# Patient Record
Sex: Female | Born: 1965
Health system: Southern US, Community
[De-identification: ages and names within clinical notes are randomized; demographics above are authoritative.]

## PROBLEM LIST (undated history)

## (undated) DIAGNOSIS — D219 Benign neoplasm of connective and other soft tissue, unspecified: Secondary | ICD-10-CM

## (undated) DIAGNOSIS — Z9221 Personal history of antineoplastic chemotherapy: Secondary | ICD-10-CM

## (undated) DIAGNOSIS — C50919 Malignant neoplasm of unspecified site of unspecified female breast: Secondary | ICD-10-CM

## (undated) DIAGNOSIS — Z923 Personal history of irradiation: Secondary | ICD-10-CM

## (undated) DIAGNOSIS — K219 Gastro-esophageal reflux disease without esophagitis: Secondary | ICD-10-CM

## (undated) DIAGNOSIS — I10 Essential (primary) hypertension: Secondary | ICD-10-CM

## (undated) DIAGNOSIS — R059 Cough, unspecified: Secondary | ICD-10-CM

## (undated) DIAGNOSIS — R05 Cough: Secondary | ICD-10-CM

## (undated) DIAGNOSIS — E119 Type 2 diabetes mellitus without complications: Secondary | ICD-10-CM

## (undated) HISTORY — PX: MASTECTOMY: SHX3

## (undated) HISTORY — DX: Essential (primary) hypertension: I10

## (undated) HISTORY — DX: Malignant neoplasm of unspecified site of unspecified female breast: C50.919

## (undated) HISTORY — PX: WISDOM TOOTH EXTRACTION: SHX21

## (undated) HISTORY — DX: Type 2 diabetes mellitus without complications: E11.9

---

## 2004-07-24 ENCOUNTER — Emergency Department (HOSPITAL_COMMUNITY): Admission: EM | Admit: 2004-07-24 | Discharge: 2004-07-24 | Payer: Self-pay | Admitting: Emergency Medicine

## 2011-12-01 ENCOUNTER — Ambulatory Visit: Payer: Self-pay

## 2011-12-15 DIAGNOSIS — D689 Coagulation defect, unspecified: Secondary | ICD-10-CM

## 2011-12-15 HISTORY — DX: Coagulation defect, unspecified: D68.9

## 2012-08-23 ENCOUNTER — Other Ambulatory Visit: Payer: Self-pay | Admitting: Obstetrics & Gynecology

## 2012-08-23 DIAGNOSIS — N63 Unspecified lump in unspecified breast: Secondary | ICD-10-CM

## 2012-08-24 ENCOUNTER — Other Ambulatory Visit: Payer: Self-pay

## 2012-08-25 ENCOUNTER — Ambulatory Visit
Admission: RE | Admit: 2012-08-25 | Discharge: 2012-08-25 | Disposition: A | Discharge status: Home / Self Care | Payer: 59 | Source: Ambulatory Visit | Attending: Obstetrics & Gynecology | Admitting: Obstetrics & Gynecology

## 2012-08-25 ENCOUNTER — Other Ambulatory Visit: Payer: Self-pay

## 2012-08-25 ENCOUNTER — Other Ambulatory Visit: Payer: Self-pay | Admitting: Obstetrics & Gynecology

## 2012-08-25 DIAGNOSIS — N63 Unspecified lump in unspecified breast: Secondary | ICD-10-CM

## 2012-08-25 DIAGNOSIS — N6320 Unspecified lump in the left breast, unspecified quadrant: Secondary | ICD-10-CM

## 2012-09-01 ENCOUNTER — Other Ambulatory Visit: Payer: Self-pay | Admitting: Obstetrics & Gynecology

## 2012-09-01 ENCOUNTER — Ambulatory Visit
Admission: RE | Admit: 2012-09-01 | Discharge: 2012-09-01 | Disposition: A | Discharge status: Home / Self Care | Payer: 59 | Source: Ambulatory Visit | Attending: Obstetrics & Gynecology | Admitting: Obstetrics & Gynecology

## 2012-09-01 DIAGNOSIS — N6320 Unspecified lump in the left breast, unspecified quadrant: Secondary | ICD-10-CM

## 2012-09-02 ENCOUNTER — Other Ambulatory Visit: Payer: Self-pay | Admitting: Obstetrics & Gynecology

## 2012-09-02 ENCOUNTER — Ambulatory Visit
Admission: RE | Admit: 2012-09-02 | Discharge: 2012-09-02 | Disposition: A | Discharge status: Home / Self Care | Payer: 59 | Source: Ambulatory Visit | Attending: Obstetrics & Gynecology | Admitting: Obstetrics & Gynecology

## 2012-09-02 DIAGNOSIS — C50912 Malignant neoplasm of unspecified site of left female breast: Secondary | ICD-10-CM

## 2012-09-02 DIAGNOSIS — N6320 Unspecified lump in the left breast, unspecified quadrant: Secondary | ICD-10-CM

## 2012-09-05 ENCOUNTER — Telehealth: Payer: Self-pay | Admitting: *Deleted

## 2012-09-05 DIAGNOSIS — C50419 Malignant neoplasm of upper-outer quadrant of unspecified female breast: Secondary | ICD-10-CM

## 2012-09-05 DIAGNOSIS — C50412 Malignant neoplasm of upper-outer quadrant of left female breast: Secondary | ICD-10-CM | POA: Insufficient documentation

## 2012-09-05 NOTE — Telephone Encounter (Signed)
Confirmed [facility] for 09/07/12 at 0800 .  Instructions and contact information given.  

## 2012-09-07 ENCOUNTER — Ambulatory Visit (HOSPITAL_BASED_OUTPATIENT_CLINIC_OR_DEPARTMENT_OTHER): Payer: 59 | Admitting: Oncology

## 2012-09-07 ENCOUNTER — Encounter: Payer: Self-pay | Admitting: Genetic Counselor

## 2012-09-07 ENCOUNTER — Encounter: Payer: Self-pay | Admitting: *Deleted

## 2012-09-07 ENCOUNTER — Ambulatory Visit: Payer: 59 | Attending: Surgery | Admitting: Physical Therapy

## 2012-09-07 ENCOUNTER — Ambulatory Visit
Admission: RE | Admit: 2012-09-07 | Discharge: 2012-09-07 | Disposition: A | Discharge status: Home / Self Care | Payer: 59 | Source: Ambulatory Visit | Attending: Radiation Oncology | Admitting: Radiation Oncology

## 2012-09-07 ENCOUNTER — Other Ambulatory Visit (HOSPITAL_BASED_OUTPATIENT_CLINIC_OR_DEPARTMENT_OTHER): Payer: 59 | Admitting: Lab

## 2012-09-07 ENCOUNTER — Ambulatory Visit: Payer: 59

## 2012-09-07 ENCOUNTER — Ambulatory Visit (HOSPITAL_BASED_OUTPATIENT_CLINIC_OR_DEPARTMENT_OTHER): Payer: 59 | Admitting: Surgery

## 2012-09-07 ENCOUNTER — Ambulatory Visit (HOSPITAL_BASED_OUTPATIENT_CLINIC_OR_DEPARTMENT_OTHER): Payer: 59 | Admitting: Genetic Counselor

## 2012-09-07 VITALS — BP 166/98 | HR 83 | Temp 98.6°F | Resp 20 | Ht 63.5 in | Wt 212.4 lb

## 2012-09-07 DIAGNOSIS — R293 Abnormal posture: Secondary | ICD-10-CM | POA: Insufficient documentation

## 2012-09-07 DIAGNOSIS — M25619 Stiffness of unspecified shoulder, not elsewhere classified: Secondary | ICD-10-CM | POA: Insufficient documentation

## 2012-09-07 DIAGNOSIS — C50419 Malignant neoplasm of upper-outer quadrant of unspecified female breast: Secondary | ICD-10-CM

## 2012-09-07 DIAGNOSIS — IMO0001 Reserved for inherently not codable concepts without codable children: Secondary | ICD-10-CM | POA: Insufficient documentation

## 2012-09-07 DIAGNOSIS — Z801 Family history of malignant neoplasm of trachea, bronchus and lung: Secondary | ICD-10-CM

## 2012-09-07 DIAGNOSIS — C50919 Malignant neoplasm of unspecified site of unspecified female breast: Secondary | ICD-10-CM | POA: Insufficient documentation

## 2012-09-07 LAB — COMPREHENSIVE METABOLIC PANEL (CC13)
ALT: 15 U/L (ref 0–55)
AST: 21 U/L (ref 5–34)
Albumin: 3.5 g/dL (ref 3.5–5.0)
Alkaline Phosphatase: 134 U/L (ref 40–150)
Calcium: 9.5 mg/dL (ref 8.4–10.4)
Chloride: 105 mEq/L (ref 98–107)
Creatinine: 0.8 mg/dL (ref 0.6–1.1)
Potassium: 3.7 mEq/L (ref 3.5–5.1)

## 2012-09-07 LAB — CBC WITH DIFFERENTIAL/PLATELET
BASO%: 0.8 % (ref 0.0–2.0)
EOS%: 1.9 % (ref 0.0–7.0)
MCH: 20.9 pg — ABNORMAL LOW (ref 25.1–34.0)
MCHC: 30.5 g/dL — ABNORMAL LOW (ref 31.5–36.0)
RDW: 20.6 % — ABNORMAL HIGH (ref 11.2–14.5)
lymph#: 2.7 10*3/uL (ref 0.9–3.3)

## 2012-09-07 NOTE — Progress Notes (Signed)
Dr.  Pierce Crane requested a consultation for genetic counseling and risk assessment for Hannah Hale, a 46 y.o. female, for discussion of her personal history of breast cancer and family history of lung and prostate cancer. She presents to clinic today, with her husband and sister, to discuss the possibility of a genetic predisposition to cancer, and to further clarify her risks, as well as her family members' risks for cancer.   HISTORY OF PRESENT ILLNESS: In September 2013, at the age of 35, Hannah Hale was diagnosed with breast cancer.  She is being seen in the multidisciplinary clinic.    Past Medical History  Diagnosis Date  . Breast cancer     History reviewed. No pertinent past surgical history.  History  Substance Use Topics  . Smoking status: Current Every Day Smoker -- 0.5 packs/day for 29 years    Types: Cigarettes  . Smokeless tobacco: Not on file  . Alcohol Use: 3.6 oz/week    6 Cans of beer per week     6 pack/week    REPRODUCTIVE HISTORY AND PERSONAL RISK ASSESSMENT FACTORS: Menarche was at age 47.   Premenopausal Uterus Intact: Yes Ovaries Intact: Yes [id number] , first live birth at age N/A  She has not previously undergone treatment for infertility.   OCP use for 15 years   She has not used HRT in the past.    FAMILY HISTORY:  We obtained a detailed, 4-generation family history.  Significant diagnoses are listed below: Family History  Problem Relation Age of Onset  . Lung cancer Maternal Grandfather   . Diabetes Mother   . Prostate cancer Father 77  . Stroke Maternal Grandmother   . Lung cancer Cousin     non smoker, died in his 24s; paternal cousin  . Lung cancer Cousin     father's maternal cousin; smoker  the patient was diagnosed with breast cancer at age 54. Her father was diagnosed with prostate cancer at age 64, and her paternal cousin, who was a non-smoker, was diagnosed and died of lung cancer in his 26s.  Her father's maternal  cousin was a smoker and died of lung cancer.  The patient's maternal grandfather also was a smoker and died of lung cancer.  There is no other reported cancer history of either side of the family.  Patient's maternal ancestors are of [place] descent, and paternal ancestors are of [place] descent. There is no reported Ashkenazi Jewish ancestry. There is no  known consanguinity.  GENETIC COUNSELING RISK ASSESSMENT, DISCUSSION, AND SUGGESTED FOLLOW UP: We reviewed the natural history and genetic etiology of sporadic, familial and hereditary cancer syndromes.  About 5-10% of breast cancer is hereditary.  Of this, about 85% is the result of a BRCA1 or BRCA2 mutation.  We reviewed the red flags of hereditary cancer syndromes and the dominant inheritance patterns.  If the BRCA testing is negative, we discussed that we could be testing for the wrong gene.  We discussed gene panels, and that several cancer genes that are associated with different cancers can be tested at the same time.  Because of the different types of cancer that are in the patient's family, I would consider the BRCAPlus panel test.  The patient declined genetic testing.   The patient's personal history of breast cancer and family history of prostate and lung cancer is suggestive of the following possible diagnosis: possible hereditary cancer syndrome  We discussed that  identification of a hereditary cancer syndrome may help her care providers tailor the patients medical management. If a mutation indicating a hereditary cancer syndrome is detected in this case, the [employer] recommendations would include increased cancer survelliance and possible prophylactic surgery. If a mutation is detected, the patient will be referred back to the referring provider and to any additional appropriate care providers to discuss the relevant options.   If a mutation is not  found in the patient, this will decrease the likelihood of a hereditary cancer syndrome as the explanation for her breast cancer. Cancer surveillance options would be discussed for the patient according to the appropriate standard National Comprehensive Cancer Network and American Cancer Society guidelines, with consideration of their personal and family history risk factors. In this case, the patient will be referred back to their care providers for discussions of management.   In order to estimate her chance of having a BRCA1 or BRCA2 mutation, we used the statistical model (Penn II) and laboratory data that take into account her personal medical history, family history and ancestry.  Because each model is different, there can be a lot of variability in the risks they give.  Therefore, these numbers must be considered a rough range and not a precise risk of having a BRCA1 or BRCA2 mutation.  This model estimates that she has approximately a 10% chance of having a mutation. Based on this assessment of her family and personal history, genetic testing is recommended.  After considering the risks, benefits, and limitations, the patient declined genetic testing.   The patient was seen for a total of 30 minutes, greater than 50% of which was spent face-to-face counseling.  This plan is being carried out per Dr. Theron Arista Rubin's recommendations.  This note will also be sent to the referring provider via the electronic medical record. The patient will be supplied with a summary of this genetic counseling discussion as well as educational information on the discussed hereditary cancer syndromes following the conclusion of their visit.   Patient was discussed with Dr. Drue Second.  _______________________________________________________________________ For Office Staff:  Number of people involved in session: 4 Was an Intern/ student involved with case: not applicable

## 2012-09-07 NOTE — Progress Notes (Addendum)
Re:   [name] DOB:   Jan 24, 1966 MRN:   [id number]  [facility]  ASSESSMENT AND PLAN: 1.  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  I discussed the options for breast cancer treatment with the patient.  I discussed the idea of a multidisciplinary approach to the treatment of breast cancer, which includes medical oncology and radiation oncology.  I discussed the surgical options of lumpectomy vs. mastectomy.  She will need a mastectomy and there is the possibility of reconstruction.  I talked about how smoking will affect that decision. I discussed the options of lymph node biopsy.  The treatment plan depends on the pathologic staging of the tumor and the patient's personal wishes.  The risks of surgery include, but are not limited to, bleeding, infection, the need for further surgery, and nerve injury.  The patient has been given literature on the treatment of breast cancer.  Plan:  To get MRI tomorrow, to see Dr. Odis Luster 09/09/2012 about possible reconstruction, then will schedule surgery.  She is seeing genetics also.  [MRI - 09/08/2012.  The abnormal area in total measures 17.6(AP) x 8.8 (trv) x 9.4 (CC) cm.  DN  09/13/2012] [I spoke with Ms. Kizzie Bane.  She has seen Dr. Odis Luster and plans immediate reconstruction with what sounds like a combined latissimus flap/implant.  Will shcedule.  DN 09/13/2012] [The patient's hgb is 8.9.  She does have heavy periods and has uterine fibroids.  Dr. Odis Luster is concerned about immediate reconstruction, but the patient wants to continue with the plan for mastectomy. So will plan mastectomy without reconstruction on Thursday, 11/03/2012.  DN  10/31/2012]  2.  Smokes cigarettes - 1/2 pack per day  Going to try to quit.  REFERRING PHYSICIAN: GUEST, Loretha Stapler, MD  HISTORY OF PRESENT ILLNESS: Hannah Hale is a 46 y.o. (DOB: [birth year])  AA female whose primary care physician  is GUEST, Loretha Stapler, MD and comes [facility] the [facility] for left breast cancer.  Her husband and her sister, Hannah Hale (from [place]), are with her.  She sees Dr. Mitchel Honour at [facility].  Ms. Sapien felt a lumpiness in her left breast recently.  This prompted a mammogram.  She has had no prior breast problems or surgery.  This was her first mammogram. She still is having regular periods. LMP was 3 weeks.  Has no children.  No family history of breast cancer.   Past Medical History  Diagnosis Date  . Breast cancer      No past surgical history on file.    Current Outpatient Prescriptions  Medication Sig Dispense Refill  . diphenhydrAMINE (SOMINEX) 25 MG tablet Take 25 mg by mouth at bedtime as needed.         No Known Allergies  REVIEW OF SYSTEMS: Skin:  No history of rash.  No history of abnormal moles. Infection:  No history of hepatitis or HIV.  No history of MRSA. Neurologic:  No history of stroke.  No history of seizure.  No history of headaches. Cardiac:  No history of hypertension. No history of heart disease.  No history of prior cardiac catheterization.  No history of seeing a cardiologist. Pulmonary:  Smokes 1/2 ppd. She knows this is bad for her health.  Endocrine:  No diabetes. No thyroid disease. Gastrointestinal:  No history of stomach disease.  No history of liver disease.  No history of gall bladder  disease.  No history of pancreas disease.  No history of colon disease. Urologic:  No history of kidney stones.  No history of bladder infections. Musculoskeletal:  No history of joint or back disease. Hematologic:  No bleeding disorder.  No history of anemia.  Not anticoagulated. Psycho-social:  The patient is oriented.   The patient has no obvious psychologic or social impairment to understanding our conversation and plan.  SOCIAL and FAMILY HISTORY: Married.  Husband is with her. She works at [employer] as a Scientist, clinical (histocompatibility and immunogenetics). Her sister, Hannah Hale, is with her.  PHYSICAL  EXAM: LMP 08/04/2012   General: AA F who is alert and generally healthy appearing.  HEENT: Normal. Pupils equal. Neck: Supple. No mass.  No thyroid mass. Lymph Nodes:  No supraclavicular, cervical nodes, or axillary nodes. Lungs: Clear to auscultation and symmetric breath sounds. Heart:  RRR. No murmur or rub. Breasts:  Left:  Tender from biopsy, lumpiness at the 3 o'clock position.  It is difficult for me to tell how much of a mass effect there is because of the tenderness.  Acne LIQ.  Right:  She has acne in the LIQ of both breasts.  Abdomen: Soft. No mass. No tenderness. No hernia. Normal bowel sounds.  No abdominal scars. Rectal: Not done. Extremities:  Good strength and ROM  in upper and lower extremities. Neurologic:  Grossly intact to motor and sensory function. Psychiatric: Has normal mood and affect. Behavior is normal.   DATA REVIEWED: Mammogram and path.  Path report to patient.  Ovidio Kin, MD,  [facility] [address] [place]  [address]   [place], [place]    [address] Phone:  [phone] FAX:  [phone]

## 2012-09-07 NOTE — Progress Notes (Signed)
[facility] Psychosocial Distress Screening Clinical Social Work  Clinical Social Work was referred by distress screening protocol.  The patient scored a 3 on the Psychosocial Distress Thermometer which indicates mild distress. Clinical Child psychotherapist met with patient, patient's husband,and sister in [facility] to assess for distress and other psychosocial needs.  Pt was tearful at times, but stated she felt much better after speaking with the physicians.  CSW provided pt with information on the [facility] support team and support services.  CSW also provided a patient and family support calendar and encouraged pt to call with any questions or concerns.      Clinical Social Worker follow up needed: not at this time  Tamala Julian, MSW, LCSW Clinical Social Worker [facility] [phone]

## 2012-09-07 NOTE — Progress Notes (Signed)
I met with Hannah Hale and her family today. At this point it looks like she has a large area of microcalcifications and DCIS. We discussed that due to the size of this lesion she would benefit from a mastectomy. I do not see a role for radiation at this time. She would only need postop radiation if she was found to have a positive lymph node, positive margin for invasive cancer or invasive cancer measuring greater than 5 cm. I have not scheduled followup with me.

## 2012-09-08 ENCOUNTER — Ambulatory Visit
Admission: RE | Admit: 2012-09-08 | Discharge: 2012-09-08 | Disposition: A | Discharge status: Home / Self Care | Payer: 59 | Source: Ambulatory Visit | Attending: Obstetrics & Gynecology | Admitting: Obstetrics & Gynecology

## 2012-09-08 DIAGNOSIS — C50912 Malignant neoplasm of unspecified site of left female breast: Secondary | ICD-10-CM

## 2012-09-08 MED ORDER — GADOBENATE DIMEGLUMINE 529 MG/ML IV SOLN
19.0000 mL | Freq: Once | INTRAVENOUS | Status: AC | PRN
  Administered 2012-09-08: 19 mL via INTRAVENOUS

## 2012-09-11 NOTE — Progress Notes (Signed)
Hannah Hale [id number] [birth year] 46 y.o. 09/11/2012 5:38 PM  CC  GUEST, Loretha Stapler, MD [address] [place] [address]  REASON FOR CONSULTATION:  DCIS Patient was seen in the Multidisciplinary Breast Clinic for discussion of her treatment options. She was seen by Dr. Pierce Crane, Radiation [facility]  STAGE:   Cancer of upper-outer quadrant of female breast, Left.   Primary site: Breast (Left)   Staging method: AJCC 7th Edition   Clinical: Stage 0 (Tis (DCIS), N0, cM0)   Summary: Stage 0 (Tis (DCIS), N0, cM0)  REFERRING PHYSICIAN:    HISTORY OF PRESENT ILLNESS:  Hannah Hale is a 46 y.o. female.   In previous good health. She had detected today abnormality in her left breast for number of weeks prior to presentation. She underwent bilateral diagnostic mammogram 08/25/2012. This showed pleomorphic calcifications of the entire upper outer left breast measuring 11 x 12 x 15 cm. Physical exam showed a firm multinodular masslike area over the left upper outer quadrant. Ultrasound confirmed multiple ill-defined hypoechoic areas. Ultrasound of the left axilla showed a few abnormal appearing lymph nodes. Biopsy of both the lymph node and he upper outer quadrant of the left breast was performed 09/01/2012. The biopsy showed high-grade DCIS with necrosis, lymph node was negative. He prognostic panel is currently pending. ER2% PR 0%.  Past Medical History:  Past Medical History  Diagnosis Date  . Breast cancer     Past Surgical History:  No past surgical history on file.  Family History:  Family History  Problem Relation Age of Onset  . Lung cancer Maternal Grandfather   . Diabetes Mother   . Prostate cancer Father 32  . Stroke Maternal Grandmother   . Lung cancer Cousin     non smoker, died in his 20s; paternal cousin  . Lung cancer Cousin     father's maternal cousin; smoker    Social History  History    Substance Use Topics  . Smoking status: Current Every Day Smoker -- 0.5 packs/day for 29 years    Types: Cigarettes  . Smokeless tobacco: Not on file  . 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 Allergies:  No Known Allergies  Current Medications:  Current Outpatient Prescriptions  Medication Sig Dispense Refill  . diphenhydrAMINE (SOMINEX) 25 MG tablet Take 25 mg by mouth at bedtime as needed.        OB/GYN History: G0 P0, menarche 15 currently having regular periods. No history of hormone replacement therapy.  Fertility Discussion: NA Prior History of Cancer: No  Health Maintenance:  Colonoscopy no  Bone Density no  Last PAP smear 2013  ECOG PERFORMANCE STATUS: 0 - Asymptomatic  Genetic Counseling/testing: N.  REVIEW OF SYSTEMS:  A comprehensive review of systems was negative.  PHYSICAL EXAMINATION: Blood pressure 166/98, pulse 83, temperature 98.6 F (37 C), resp. rate 20, height 5' 3.5" (1.613 m), weight 212 lb 6.4 oz (96.344 kg), last menstrual period 08/04/2012.  HEENT:  Sclerae anicteric, conjunctivae pink.  Oropharynx clear.  No mucositis or candidiasis.  Nodes:  No cervical, supraclavicular, or axillary lymphadenopathy palpated.  Breast Exam:  Right breast is benign.  No masses, discharge, skin change, or nipple inversion.  Left breast is benign. , She has some tenderness present. There is a vague masslike effect  in the lower quadrant. No masses, discharge, skin change, or nipple inversion..  Lungs:  Clear to auscultation bilaterally.  No crackles, rhonchi, or wheezes.  Heart:  Regular rate and rhythm.  Abdomen:  Soft, nontender.  Positive bowel sounds.  No organomegaly or masses palpated.  Musculoskeletal:  No focal spinal tenderness to palpation.  Extremities:  Benign.  No peripheral edema or cyanosis.  Skin:  Benign.  Neuro:  Nonfocal.       STUDIES/RESULTS: US Breast Left  09-21-2012  *RADIOLOGY REPORT*  Clinical Data:  Patient presents for a bilateral diagnostic mammogram due to a history of a palpable abnormality over the upper outer left breast for several weeks.  DIGITAL DIAGNOSTIC BILATERAL MAMMOGRAM WITH CAD AND LEFT BREAST ULTRASOUND:  Comparison:  None.  Findings:  Exam demonstrates heterogeneously dense fibroglandular tissue.  There is a nodular region of parenchymal density with associated pleomorphic microcalcifications over the entire upper outer left breast measuring approximately 11 x 12 x 15 cm corresponding to the area of patient's palpable abnormality. Mammographic images were processed with CAD.  On physical exam, I palpate a firm multinodular mass-like area over most of the left upper outer quadrant corresponding to the mammographic abnormality.  Ultrasound is performed, showing multiple ill-defined hypoechoic regions with shadowing throughout the entire upper outer left breast with several more discrete mass-like areas one of which over the 2 o'clock position 13 cm from the nipple measures 0.9 x 1.0 x 1.3 cm.  Ultrasound of the left axilla demonstrates a couple deep abnormal appearing lymph nodes with thickened cortex.  IMPRESSION: Extensive abnormality as described over most of the upper outer quadrant of the left breast suspicious for a primary neoplastic process.  Several deep abnormal appearing left axillary lymph nodes.  RECOMMENDATION: Recommend ultrasound-guided core needle biopsy of this suspicious abnormality and  biopsy of the abnormal appearing left axillary nodes.  BI-RADS CATEGORY 5:  Highly suggestive of malignancy - appropriate action should be taken.  Biopsy will be scheduled prior to patient's departure from the Breast Center today.  Biopsy scheduled for  09/01/2012 at 07:30 a.m.   Original Report Authenticated By: Elba Barman, M.D.    Mr Breast Bilateral W Wo Contrast  09/08/2012  *RADIOLOGY  REPORT*  Clinical Data: New diagnosis left sided breast cancer.  BILATERAL BREAST MRI WITH AND WITHOUT CONTRAST  Technique: Multiplanar, multisequence MR images of both breasts were obtained prior to and following the intravenous administration of 19ml of Multihance.  Three dimensional images were evaluated at the independent DynaCad workstation.  Comparison:  Mammogram dated 09/01/2012  Findings: Moderate background parenchymal enhancement and foci of nonspecific enhancement are seen bilaterally. Several enhancing masses with intervening areas of non masslike enhancement are identified involving the entire lateral aspect and upper central aspect of the left breast spanning from the nipple to the posterior aspect of the breast.  The area in total measures 17.6(AP) x 8.8 (trv) x 9.4 (CC) cm and there is extensive associated edema. Biopsy clip artifact is seen in the lateral central aspect of the left breast, middle third in association with non masslike enhancement. No skin enhancement is identified.  A lymph node with a diffusely thickened cortex is imaged in the left axilla measuring 2.5 x 1.2 cm with overlying biopsy changes compatible with the recently biopsied benign lymph node.  No other suspicious adenopathy is seen in the left.  No suspicious mass or enhancement is seen in the right breast.  There is no axillary or internal mammary adenopathy  on the right.  IMPRESSION: Known malignancy, left breast.  If breast conservation therapy is desired, biopsies of the anterior and posterior extent of calcifications seen on mammography is recommended.  No MRI specific evidence of malignancy, right breast.  RECOMMENDATION: Treatment plan, left breast  THREE-DIMENSIONAL MR IMAGE RENDERING ON INDEPENDENT WORKSTATION:  Three-dimensional MR images were rendered by post-processing of the original MR data on an independent workstation.  The three- dimensional MR images were interpreted, and findings were reported in the  accompanying complete MRI report for this study.  BI-RADS CATEGORY 6:  Known biopsy-proven malignancy - appropriate action should be taken.   Original Report Authenticated By: Hiram Gash, M.D.    [place] Core Biopsy  09/01/2012  *RADIOLOGY REPORT*  Clinical Data:  Patient presents for ultrasound-guided core biopsy of left upper outer quadrant masses well as abnormal left axillary lymph nodes.  ULTRASOUND GUIDED CORE BIOPSY OF THE left axilla  Comparison: 08/25/2012  I met with the patient and we discussed the procedure of ultrasound- guided biopsy, including benefits and alternatives.  We discussed the high likelihood of a successful procedure. We discussed the risks of the procedure, including infection, bleeding, tissue injury, clip migration, and inadequate sampling.  Informed written consent was given.  Using sterile technique 4 ml lidocaine, ultrasound guidance and a 14 gauge automated biopsy device, biopsy was performed of abnormal left axillary lymph node using a inferior lateral to superior medial approach.  At the conclusion of the procedure a tissue marker clip was not deployed into the biopsy cavity.  IMPRESSION: Ultrasound guided biopsy of left axillary lymph node.  No apparent complications.   Original Report Authenticated By: Elba Barman, M.D.    [place] Core Biopsy  09/01/2012  *RADIOLOGY REPORT*  Clinical Data:  Patient presents for ultrasound-guided core needle biopsy of an extensive area of abnormal decreased echogenicity with associated microcalcifications in the upper outer quadrant of the left breast.  ULTRASOUND GUIDED VACUUM ASSISTED CORE BIOPSY OF THE LEFT BREAST  Comparison: Previous exams.  I met with the patient and we discussed the procedure of ultrasound- guided biopsy, including benefits and alternatives.  We discussed the high likelihood of a successful procedure. We discussed the risks of the procedure including infection, bleeding, tissue injury, clip migration, and  inadequate sampling.  Informed written consent was given.  Using sterile technique, 2% lidocaine ultrasound guidance and a 12 gauge vacuum assisted needle biopsy was performed of the area of extensive nodular decreased echogenicity with calcifications. using a lateral to medial approach.  At the conclusion of the procedure, a ribbon shaped tissue marker clip was deployed into the biopsy cavity.  Follow-up 2-view mammogram was performed and dictated separately.  IMPRESSION: Ultrasound-guided biopsy of an area of extensive nodular decreased echogenicity in the left upper outer quadrant.  No apparent complications.   Original Report Authenticated By: Elba Barman, M.D.    Mm Digital Diagnostic Bilat  08/25/2012  *RADIOLOGY REPORT*  Clinical Data:  Patient presents for a bilateral diagnostic mammogram due to a history of a palpable abnormality over the upper outer left breast for several weeks.  DIGITAL DIAGNOSTIC BILATERAL MAMMOGRAM WITH CAD AND LEFT BREAST ULTRASOUND:  Comparison:  None.  Findings:  Exam demonstrates heterogeneously dense fibroglandular tissue.  There is a nodular region of parenchymal density with associated pleomorphic microcalcifications over the entire upper outer left breast measuring approximately 11 x 12 x 15 cm corresponding to the area of patient's palpable abnormality. Mammographic images were processed with CAD.  On physical exam, I palpate a firm multinodular mass-like area over most of the left upper outer quadrant corresponding to the mammographic abnormality.  Ultrasound is performed, showing multiple ill-defined hypoechoic regions with shadowing throughout the entire upper outer left breast with several more discrete mass-like areas one of which over the 2 o'clock position 13 cm from the nipple measures 0.9 x 1.0 x 1.3 cm.  Ultrasound of the left axilla demonstrates a couple deep abnormal appearing lymph nodes with thickened cortex.  IMPRESSION: Extensive abnormality as described  over most of the upper outer quadrant of the left breast suspicious for a primary neoplastic process.  Several deep abnormal appearing left axillary lymph nodes.  RECOMMENDATION: Recommend ultrasound-guided core needle biopsy of this suspicious abnormality and  biopsy of the abnormal appearing left axillary nodes.  BI-RADS CATEGORY 5:  Highly suggestive of malignancy - appropriate action should be taken.  Biopsy will be scheduled prior to patient's departure from the Breast Center today.  Biopsy scheduled for  09/01/2012 at 07:30 a.m.   Original Report Authenticated By: Elba Barman, M.D.    Mm Digital Diagnostic Unilat L  09/01/2012  *RADIOLOGY REPORT*  Clinical Data: The patient is post ultrasound guided core biopsy of an extensive abnormal hypoechoic area in the upper outer left breast at approximately 2 to 3 o'clock position and also biopsy of an abnormal left axillary lymph node.  ULTRASOUND GUIDED POST-BIOPSY CLIP PLACEMENT LEFT  Comparison:  08/25/2012  Findings: Two views of the left breast demonstrate satisfactory clip placement over the outer mid breast.  IMPRESSION: Satisfactory clip placement post core biopsy left breast.   Original Report Authenticated By: Elba Barman, M.D.    Mm Radiologist Eval And Mgmt  09/02/2012  *RADIOLOGY REPORT*  Clinical Data: Status post biopsy of the left breast and left axilla.  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  These are found to be concordant with imaging findings.  I discussed the results with the patient and her husband and answered their questions.  I examined patient's left breast and left axilla. The biopsy sites are clean.  The patient states she has no problems except mild pain.  Recommend MRI of the breasts and surgical consultation.  IMPRESSION: Post biopsy results consultation as  described.   Original Report Authenticated By: Sherian Rein, M.D.      LABS:    Chemistry      Component Value Date/Time   NA 137 09/07/2012 0834   K 3.7 09/07/2012 0834   CL 105 09/07/2012 0834   CO2 21* 09/07/2012 0834   BUN 11.0 09/07/2012 0834   CREATININE 0.8 09/07/2012 0834      Component Value Date/Time   CALCIUM 9.5 09/07/2012 0834   ALKPHOS 134 09/07/2012 0834   AST 21 09/07/2012 0834   ALT 15 09/07/2012 0834   BILITOT 0.40 09/07/2012 0834      Lab Results  Component Value Date   WBC 12.5* 09/07/2012   HGB 9.2* 09/07/2012   HCT 30.2* 09/07/2012   MCV 68.6* 09/07/2012   PLT 325 09/07/2012       PATHOLOGY: Multifocal DCIS  ASSESSMENT    Patient will have a MRI scan tomorrow. The likelihood given the extent of calcifications. And she will likely need a mastectomy. I shall be whether she can have reconstruction or not. Given her smoking history  this may need to be delayed.  Clinical Trial Eligibility:  Multidisciplinary conference discussion     PLAN:    Patient a mastectomy. I will see her thereafter to discuss final any final recommendations. Given the very low ER positivity I suspect that she noticed any tamoxifen in this setting.       Discussion: Patient is being treated per NCCN breast cancer care guidelines appropriate for stage.0   Thank you so much for allowing me to participate in the care of Hannah Hale. I will continue to follow up the patient with you and assist in her care.  All questions were answered. The patient knows to call the clinic with any problems, questions or concerns. We can certainly see the patient much sooner if necessary.  I spent 25 minutes counseling the patient face to face. The total time spent in the appointment was 55 minutes.            Mervin Hack M.D. FRCP C.  09/11/2012, 5:38 PM

## 2012-09-13 ENCOUNTER — Other Ambulatory Visit (INDEPENDENT_AMBULATORY_CARE_PROVIDER_SITE_OTHER): Payer: Self-pay | Admitting: Surgery

## 2012-09-13 DIAGNOSIS — C50919 Malignant neoplasm of unspecified site of unspecified female breast: Secondary | ICD-10-CM

## 2012-09-14 ENCOUNTER — Encounter: Payer: Self-pay | Admitting: Genetic Counselor

## 2012-09-19 ENCOUNTER — Telehealth: Payer: Self-pay | Admitting: Oncology

## 2012-09-19 ENCOUNTER — Encounter: Payer: Self-pay | Admitting: *Deleted

## 2012-09-19 NOTE — Telephone Encounter (Signed)
lmonvm adviisng the pt of her dec appts with dr Donnie Coffin

## 2012-10-17 ENCOUNTER — Encounter: Payer: Self-pay | Admitting: *Deleted

## 2012-10-17 NOTE — Progress Notes (Signed)
Mailed after appt letter to pt. 

## 2012-10-21 ENCOUNTER — Encounter (HOSPITAL_COMMUNITY): Payer: Self-pay | Admitting: Pharmacy Technician

## 2012-10-25 NOTE — Pre-Procedure Instructions (Addendum)
20 Hannah Hale  10/25/2012   Your procedure is scheduled on:  Thursday, November 21st  Report to [facility] at 0830 AM.  Call this number if you have problems the morning of surgery: [phone]   Remember:   Do not eat food or drink:After Midnight.   Take these medicines the morning of surgery with A SIP OF WATER: none   Do not wear jewelry, make-up or nail polish.  Do not wear lotions, powders, or perfumes.   Do not shave 48 hours prior to surgery.  Do not bring valuables to the hospital.  Contacts, dentures or bridgework may not be worn into surgery.  Leave suitcase in the car. After surgery it may be brought to your room.  For patients admitted to the hospital, checkout time is 11:00 AM the day of discharge.   Patients discharged the day of surgery will not be allowed to drive home.               Spouse donald [phone]  Special Instructions: Shower using CHG 2 nights before surgery and the night before surgery.  If you shower the day of surgery use CHG.  Use special wash - you have one bottle of CHG for all showers.  You should use approximately 1/3 of the bottle for each shower.   Please read over the following fact sheets that you were given: Pain Booklet, Coughing and Deep Breathing, MRSA Information and Surgical Site Infection Prevention

## 2012-10-26 ENCOUNTER — Encounter (HOSPITAL_COMMUNITY)
Admission: RE | Admit: 2012-10-26 | Discharge: 2012-10-26 | Disposition: A | Discharge status: Home / Self Care | Payer: 59 | Source: Ambulatory Visit | Attending: Surgery | Admitting: Surgery

## 2012-10-26 ENCOUNTER — Encounter (HOSPITAL_COMMUNITY)
Admission: RE | Admit: 2012-10-26 | Discharge: 2012-10-26 | Disposition: A | Discharge status: Home / Self Care | Payer: 59 | Source: Ambulatory Visit | Attending: Anesthesiology | Admitting: Anesthesiology

## 2012-10-26 ENCOUNTER — Encounter (HOSPITAL_COMMUNITY): Payer: Self-pay

## 2012-10-26 HISTORY — DX: Cough, unspecified: R05.9

## 2012-10-26 HISTORY — DX: Cough: R05

## 2012-10-26 LAB — BASIC METABOLIC PANEL
CO2: 23 mEq/L (ref 19–32)
Glucose, Bld: 117 mg/dL — ABNORMAL HIGH (ref 70–99)
Potassium: 3.6 mEq/L (ref 3.5–5.1)
Sodium: 142 mEq/L (ref 135–145)

## 2012-10-26 LAB — SURGICAL PCR SCREEN
MRSA, PCR: NEGATIVE
Staphylococcus aureus: NEGATIVE

## 2012-10-26 LAB — CBC
Hemoglobin: 8.9 g/dL — ABNORMAL LOW (ref 12.0–15.0)
RBC: 4.19 MIL/uL (ref 3.87–5.11)

## 2012-10-26 MED ORDER — CHLORHEXIDINE GLUCONATE 4 % EX LIQD: 1.0000 "application " | Freq: Once | CUTANEOUS | Status: DC

## 2012-10-26 NOTE — Progress Notes (Signed)
Chart left for anesthesia pa allison to review labs

## 2012-10-27 NOTE — Consult Note (Addendum)
Anesthesia chart review: Patient is a 46 year old female scheduled for left mastectomy with left axillary sentinel node biopsy (Dr. Ezzard Standing) with, immediate reconstruction (Dr. Odis Luster) for left breast cancer on 11/03/2012.  Other history includes obesity, smoking.  Labs also indicate that she has been anemia (since at least 09/07/12). PCP is listed as Dr. Robert Bellow.  Oncologist is Dr. Donnie Coffin.  CXR on 10/26/12 showed borderline cardiomegaly, no acute process.  Labs noted.  H/H 8.9/29.5 (down from 9.2/30.2 on 09/07/12).  Serum pregnancy test was negative.  I routed her CBC results to both Dr. Ezzard Standing and Dr. Odis Luster.  Will await their input.  Vitals at PAT showed a BP 142/98, HR 94, O2 sat 96%.  Shonna Chock, PA-C 10/27/12 1550  Addendum: 11/01/12 1630 I had not gotten any confirmation from Dr. Ezzard Standing or Dr. Odis Luster regarding patient's lab results, so I called [facility] and spoke with Jeoffrey Massed and spoke with Efraim Kaufmann at Dr. Odis Luster yesterday.  (Labs also faxed to Dr. Odis Luster office.)  Will defer additional orders, if any, to her surgeons.  Anesthesiologist Dr. Chaney Malling agrees with this plan.

## 2012-10-31 ENCOUNTER — Telehealth (INDEPENDENT_AMBULATORY_CARE_PROVIDER_SITE_OTHER): Payer: Self-pay | Admitting: General Surgery

## 2012-10-31 NOTE — Telephone Encounter (Signed)
Revonda Standard called to see if Dr. Ezzard Standing reviewed labs on Ms Heinzman. Revonda Standard sent a message re labs last week. Hbg 8.9 per Allison/ gy

## 2012-10-31 NOTE — Addendum Note (Signed)
Addended by: Kandis Cocking on: 10/31/2012 04:16 PM   Modules accepted: Orders

## 2012-11-02 ENCOUNTER — Encounter (HOSPITAL_COMMUNITY): Payer: Self-pay | Admitting: Pharmacy Technician

## 2012-11-02 ENCOUNTER — Other Ambulatory Visit (INDEPENDENT_AMBULATORY_CARE_PROVIDER_SITE_OTHER): Payer: Self-pay | Admitting: Surgery

## 2012-11-02 DIAGNOSIS — C50919 Malignant neoplasm of unspecified site of unspecified female breast: Secondary | ICD-10-CM

## 2012-11-02 MED ORDER — CEFAZOLIN SODIUM-DEXTROSE 2-3 GM-% IV SOLR: 2.0000 g | INTRAVENOUS | Status: DC

## 2012-11-02 MED ORDER — CEFAZOLIN SODIUM-DEXTROSE 2-3 GM-% IV SOLR
2.0000 g | INTRAVENOUS | Status: AC
  Administered 2012-11-03: 2 g via INTRAVENOUS
  Filled 2012-11-02: qty 50

## 2012-11-03 ENCOUNTER — Encounter (HOSPITAL_COMMUNITY): Payer: Self-pay | Admitting: Vascular Surgery

## 2012-11-03 ENCOUNTER — Inpatient Hospital Stay (HOSPITAL_COMMUNITY): Admission: RE | Admit: 2012-11-03 | Payer: 59 | Source: Ambulatory Visit | Admitting: Surgery

## 2012-11-03 ENCOUNTER — Encounter (HOSPITAL_COMMUNITY): Payer: Self-pay | Admitting: *Deleted

## 2012-11-03 ENCOUNTER — Observation Stay (HOSPITAL_COMMUNITY)
Admission: RE | Admit: 2012-11-03 | Discharge: 2012-11-04 | Disposition: A | Discharge status: Home / Self Care | Payer: 59 | Source: Ambulatory Visit | Attending: Surgery | Admitting: Surgery

## 2012-11-03 ENCOUNTER — Inpatient Hospital Stay (HOSPITAL_COMMUNITY): Payer: 59 | Admitting: Vascular Surgery

## 2012-11-03 ENCOUNTER — Encounter (HOSPITAL_COMMUNITY): Admission: RE | Payer: Self-pay | Source: Ambulatory Visit

## 2012-11-03 ENCOUNTER — Encounter (HOSPITAL_COMMUNITY): Payer: Self-pay | Admitting: General Practice

## 2012-11-03 ENCOUNTER — Encounter (HOSPITAL_COMMUNITY)
Admission: RE | Admit: 2012-11-03 | Discharge: 2012-11-03 | Disposition: A | Discharge status: Home / Self Care | Payer: 59 | Source: Ambulatory Visit | Attending: Surgery | Admitting: Surgery

## 2012-11-03 ENCOUNTER — Encounter (HOSPITAL_COMMUNITY)
Admission: RE | Disposition: A | Discharge status: Home / Self Care | Payer: Self-pay | Source: Ambulatory Visit | Attending: Surgery

## 2012-11-03 DIAGNOSIS — C50919 Malignant neoplasm of unspecified site of unspecified female breast: Secondary | ICD-10-CM

## 2012-11-03 DIAGNOSIS — Z01818 Encounter for other preprocedural examination: Secondary | ICD-10-CM | POA: Insufficient documentation

## 2012-11-03 DIAGNOSIS — C50419 Malignant neoplasm of upper-outer quadrant of unspecified female breast: Secondary | ICD-10-CM

## 2012-11-03 DIAGNOSIS — C773 Secondary and unspecified malignant neoplasm of axilla and upper limb lymph nodes: Secondary | ICD-10-CM | POA: Insufficient documentation

## 2012-11-03 DIAGNOSIS — F411 Generalized anxiety disorder: Secondary | ICD-10-CM | POA: Insufficient documentation

## 2012-11-03 DIAGNOSIS — F17200 Nicotine dependence, unspecified, uncomplicated: Secondary | ICD-10-CM | POA: Insufficient documentation

## 2012-11-03 DIAGNOSIS — K219 Gastro-esophageal reflux disease without esophagitis: Secondary | ICD-10-CM | POA: Insufficient documentation

## 2012-11-03 DIAGNOSIS — D509 Iron deficiency anemia, unspecified: Secondary | ICD-10-CM | POA: Insufficient documentation

## 2012-11-03 DIAGNOSIS — Z01812 Encounter for preprocedural laboratory examination: Secondary | ICD-10-CM | POA: Insufficient documentation

## 2012-11-03 DIAGNOSIS — D0590 Unspecified type of carcinoma in situ of unspecified breast: Principal | ICD-10-CM | POA: Insufficient documentation

## 2012-11-03 HISTORY — PX: SIMPLE MASTECTOMY W/ SENTINEL NODE BIOPSY: SHX2410

## 2012-11-03 HISTORY — PX: SIMPLE MASTECTOMY WITH AXILLARY SENTINEL NODE BIOPSY: SHX6098

## 2012-11-03 LAB — TYPE AND SCREEN: Antibody Screen: NEGATIVE

## 2012-11-03 SURGERY — SIMPLE MASTECTOMY WITH AXILLARY SENTINEL NODE BIOPSY
Anesthesia: General | Site: Breast | Laterality: Left | Wound class: Clean

## 2012-11-03 SURGERY — SIMPLE MASTECTOMY WITH AXILLARY SENTINEL NODE BIOPSY
Anesthesia: General | Laterality: Left

## 2012-11-03 MED ORDER — WHITE PETROLATUM GEL
Status: AC
  Filled 2012-11-03: qty 5

## 2012-11-03 MED ORDER — HYDROMORPHONE HCL PF 1 MG/ML IJ SOLN
0.2500 mg | INTRAMUSCULAR | Status: DC | PRN
  Administered 2012-11-03: 0.5 mg via INTRAVENOUS

## 2012-11-03 MED ORDER — HYDROMORPHONE HCL PF 1 MG/ML IJ SOLN
INTRAMUSCULAR | Status: AC
  Administered 2012-11-03: 0.5 mg via INTRAVENOUS
  Filled 2012-11-03: qty 1

## 2012-11-03 MED ORDER — ONDANSETRON HCL 4 MG/2ML IJ SOLN
4.0000 mg | Freq: Once | INTRAMUSCULAR | Status: AC | PRN
  Administered 2012-11-03: 4 mg via INTRAVENOUS

## 2012-11-03 MED ORDER — MIDAZOLAM HCL 5 MG/5ML IJ SOLN
INTRAMUSCULAR | Status: DC | PRN
  Administered 2012-11-03: 2 mg via INTRAVENOUS

## 2012-11-03 MED ORDER — FENTANYL CITRATE 0.05 MG/ML IJ SOLN
100.0000 ug | Freq: Once | INTRAMUSCULAR | Status: AC
  Administered 2012-11-03: 100 ug via INTRAVENOUS

## 2012-11-03 MED ORDER — ACETAMINOPHEN 10 MG/ML IV SOLN
1000.0000 mg | Freq: Four times a day (QID) | INTRAVENOUS | Status: DC
  Administered 2012-11-03 – 2012-11-04 (×3): 1000 mg via INTRAVENOUS
  Filled 2012-11-03 (×3): qty 100

## 2012-11-03 MED ORDER — ARTIFICIAL TEARS OP OINT
TOPICAL_OINTMENT | OPHTHALMIC | Status: DC | PRN
  Administered 2012-11-03: 1 via OPHTHALMIC

## 2012-11-03 MED ORDER — CHLORHEXIDINE GLUCONATE 4 % EX LIQD: 1.0000 "application " | Freq: Once | CUTANEOUS | Status: DC

## 2012-11-03 MED ORDER — 0.9 % SODIUM CHLORIDE (POUR BTL) OPTIME
TOPICAL | Status: DC | PRN
  Administered 2012-11-03: 2000 mL

## 2012-11-03 MED ORDER — ROCURONIUM BROMIDE 100 MG/10ML IV SOLN
INTRAVENOUS | Status: DC | PRN
  Administered 2012-11-03: 5 mg via INTRAVENOUS
  Administered 2012-11-03: 45 mg via INTRAVENOUS

## 2012-11-03 MED ORDER — ONDANSETRON HCL 4 MG/2ML IJ SOLN: 4.0000 mg | Freq: Four times a day (QID) | INTRAMUSCULAR | Status: DC | PRN

## 2012-11-03 MED ORDER — ACETAMINOPHEN 10 MG/ML IV SOLN
INTRAVENOUS | Status: AC
  Administered 2012-11-03: 1000 mg via INTRAVENOUS
  Filled 2012-11-03: qty 100

## 2012-11-03 MED ORDER — ONDANSETRON HCL 4 MG/2ML IJ SOLN
INTRAMUSCULAR | Status: AC
  Administered 2012-11-03: 4 mg via INTRAVENOUS
  Filled 2012-11-03: qty 2

## 2012-11-03 MED ORDER — HEPARIN SODIUM (PORCINE) 5000 UNIT/ML IJ SOLN
5000.0000 [IU] | Freq: Three times a day (TID) | INTRAMUSCULAR | Status: DC
  Administered 2012-11-03 – 2012-11-04 (×2): 5000 [IU] via SUBCUTANEOUS
  Filled 2012-11-03 (×5): qty 1

## 2012-11-03 MED ORDER — LACTATED RINGERS IV SOLN
INTRAVENOUS | Status: DC | PRN
  Administered 2012-11-03 (×2): via INTRAVENOUS

## 2012-11-03 MED ORDER — OXYCODONE HCL 5 MG PO TABS: 5.0000 mg | ORAL_TABLET | Freq: Once | ORAL | Status: DC | PRN

## 2012-11-03 MED ORDER — METHYLENE BLUE 1 % INJ SOLN
INTRAMUSCULAR | Status: AC
  Filled 2012-11-03: qty 10

## 2012-11-03 MED ORDER — ONDANSETRON HCL 4 MG/2ML IJ SOLN
INTRAMUSCULAR | Status: DC | PRN
  Administered 2012-11-03: 4 mg via INTRAVENOUS

## 2012-11-03 MED ORDER — HYDROCODONE-ACETAMINOPHEN 5-325 MG PO TABS
1.0000 | ORAL_TABLET | ORAL | Status: DC | PRN
  Administered 2012-11-03: 2 via ORAL
  Filled 2012-11-03: qty 2

## 2012-11-03 MED ORDER — PROPOFOL 10 MG/ML IV BOLUS
INTRAVENOUS | Status: DC | PRN
  Administered 2012-11-03: 125 mg via INTRAVENOUS

## 2012-11-03 MED ORDER — MORPHINE SULFATE 2 MG/ML IJ SOLN
1.0000 mg | INTRAMUSCULAR | Status: DC | PRN
  Administered 2012-11-04: 2 mg via INTRAVENOUS
  Administered 2012-11-04: 4 mg via INTRAVENOUS
  Administered 2012-11-04: 2 mg via INTRAVENOUS
  Filled 2012-11-03: qty 1
  Filled 2012-11-03: qty 2
  Filled 2012-11-03: qty 1

## 2012-11-03 MED ORDER — LABETALOL HCL 5 MG/ML IV SOLN
INTRAVENOUS | Status: DC | PRN
  Administered 2012-11-03 (×3): 5 mg via INTRAVENOUS

## 2012-11-03 MED ORDER — FENTANYL CITRATE 0.05 MG/ML IJ SOLN: INTRAMUSCULAR | Status: DC | PRN

## 2012-11-03 MED ORDER — NEOSTIGMINE METHYLSULFATE 1 MG/ML IJ SOLN
INTRAMUSCULAR | Status: DC | PRN
  Administered 2012-11-03: 3 mg via INTRAVENOUS

## 2012-11-03 MED ORDER — HYDROMORPHONE HCL PF 1 MG/ML IJ SOLN
INTRAMUSCULAR | Status: DC | PRN
  Administered 2012-11-03: 0.5 mg via INTRAVENOUS
  Administered 2012-11-03 (×2): .25 mg via INTRAVENOUS

## 2012-11-03 MED ORDER — FENTANYL CITRATE 0.05 MG/ML IJ SOLN
INTRAMUSCULAR | Status: DC | PRN
  Administered 2012-11-03: 100 ug via INTRAVENOUS
  Administered 2012-11-03: 150 ug via INTRAVENOUS

## 2012-11-03 MED ORDER — LIDOCAINE HCL (CARDIAC) 20 MG/ML IV SOLN
INTRAVENOUS | Status: DC | PRN
  Administered 2012-11-03: 100 mg via INTRAVENOUS

## 2012-11-03 MED ORDER — FENTANYL CITRATE 0.05 MG/ML IJ SOLN
INTRAMUSCULAR | Status: AC
  Filled 2012-11-03: qty 2

## 2012-11-03 MED ORDER — DEXTROSE 5 % IV SOLN
INTRAVENOUS | Status: DC | PRN
  Administered 2012-11-03: 10:00:00 via INTRAVENOUS

## 2012-11-03 MED ORDER — GLYCOPYRROLATE 0.2 MG/ML IJ SOLN
INTRAMUSCULAR | Status: DC | PRN
  Administered 2012-11-03: 0.4 mg via INTRAVENOUS

## 2012-11-03 MED ORDER — SODIUM CHLORIDE 0.9 % IJ SOLN
INTRAMUSCULAR | Status: DC | PRN
  Administered 2012-11-03: 11:00:00 via INTRAMUSCULAR

## 2012-11-03 MED ORDER — ONDANSETRON HCL 4 MG PO TABS: 4.0000 mg | ORAL_TABLET | Freq: Four times a day (QID) | ORAL | Status: DC | PRN

## 2012-11-03 MED ORDER — OXYCODONE HCL 5 MG/5ML PO SOLN: 5.0000 mg | Freq: Once | ORAL | Status: DC | PRN

## 2012-11-03 MED ORDER — TECHNETIUM TC 99M SULFUR COLLOID FILTERED
1.0000 | Freq: Once | INTRAVENOUS | Status: AC | PRN
  Administered 2012-11-03: 1 via INTRADERMAL

## 2012-11-03 MED ORDER — LACTATED RINGERS IV SOLN
INTRAVENOUS | Status: DC
  Administered 2012-11-03: 20:00:00 via INTRAVENOUS

## 2012-11-03 MED ORDER — MEPERIDINE HCL 25 MG/ML IJ SOLN: 6.2500 mg | INTRAMUSCULAR | Status: DC | PRN

## 2012-11-03 SURGICAL SUPPLY — 73 items
ADH SKN CLS APL DERMABOND .7 (GAUZE/BANDAGES/DRESSINGS) ×1
APPLIER CLIP 9.375 MED OPEN (MISCELLANEOUS) ×4
APR CLP MED 9.3 20 MLT OPN (MISCELLANEOUS) ×2
ATCH SMKEVC FLXB CAUT HNDSWH (FILTER) ×1 IMPLANT
BINDER BREAST LRG (GAUZE/BANDAGES/DRESSINGS) IMPLANT
BINDER BREAST XLRG (GAUZE/BANDAGES/DRESSINGS) ×2 IMPLANT
BIOPATCH BLUE 3/4IN DISK W/1.5 (GAUZE/BANDAGES/DRESSINGS) ×1 IMPLANT
BIOPATCH RED 1 DISK 7.0 (GAUZE/BANDAGES/DRESSINGS) ×1 IMPLANT
BLADE KNIFE  10 PERSONNA (BLADE) ×1 IMPLANT
BLADE SURG 15 STRL LF DISP TIS (BLADE) IMPLANT
BLADE SURG 15 STRL SS (BLADE)
CANISTER SUCTION 2500CC (MISCELLANEOUS) ×2 IMPLANT
CHLORAPREP W/TINT 26ML (MISCELLANEOUS) ×2 IMPLANT
CLIP APPLIE 9.375 MED OPEN (MISCELLANEOUS) ×1 IMPLANT
CLOTH BEACON ORANGE TIMEOUT ST (SAFETY) ×3 IMPLANT
CONT SPEC 4OZ CLIKSEAL STRL BL (MISCELLANEOUS) ×4 IMPLANT
COVER PROBE W GEL 5X96 (DRAPES) ×2 IMPLANT
COVER SURGICAL LIGHT HANDLE (MISCELLANEOUS) ×3 IMPLANT
DERMABOND ADVANCED (GAUZE/BANDAGES/DRESSINGS) ×1
DERMABOND ADVANCED .7 DNX12 (GAUZE/BANDAGES/DRESSINGS) ×1 IMPLANT
DRAIN CHANNEL 19F RND (DRAIN) ×3 IMPLANT
DRAPE CHEST BREAST 15X10 FENES (DRAPES) ×2 IMPLANT
DRAPE INCISE 23X17 IOBAN STRL (DRAPES)
DRAPE INCISE 23X17 STRL (DRAPES) IMPLANT
DRAPE INCISE IOBAN 23X17 STRL (DRAPES) IMPLANT
DRAPE ORTHO SPLIT 77X108 STRL (DRAPES)
DRAPE PROXIMA HALF (DRAPES) ×2 IMPLANT
DRAPE SURG 17X23 STRL (DRAPES) IMPLANT
DRAPE SURG ORHT 6 SPLT 77X108 (DRAPES) IMPLANT
DRSG PAD ABDOMINAL 8X10 ST (GAUZE/BANDAGES/DRESSINGS) ×1 IMPLANT
ELECT CAUTERY BLADE 6.4 (BLADE) ×3 IMPLANT
ELECT REM PT RETURN 9FT ADLT (ELECTROSURGICAL) ×2
ELECTRODE REM PT RTRN 9FT ADLT (ELECTROSURGICAL) ×1 IMPLANT
EVACUATOR SILICONE 100CC (DRAIN) ×3 IMPLANT
EVACUATOR SMOKE ACCUVAC VALLEY (FILTER) ×2
GLOVE BIOGEL PI IND STRL 6.5 (GLOVE) IMPLANT
GLOVE BIOGEL PI IND STRL 8 (GLOVE) IMPLANT
GLOVE BIOGEL PI INDICATOR 6.5 (GLOVE) ×3
GLOVE BIOGEL PI INDICATOR 8 (GLOVE)
GLOVE ORTHOPEDIC STR SZ6.5 (GLOVE) ×1 IMPLANT
GLOVE SURG SIGNA 7.5 PF LTX (GLOVE) ×2 IMPLANT
GOWN EXTRA PROTECTION XL (GOWNS) IMPLANT
GOWN STRL NON-REIN LRG LVL3 (GOWN DISPOSABLE) ×4 IMPLANT
GOWN STRL REIN XL XLG (GOWN DISPOSABLE) ×2 IMPLANT
KIT BASIN OR (CUSTOM PROCEDURE TRAY) ×2 IMPLANT
KIT ROOM TURNOVER OR (KITS) ×2 IMPLANT
NDL 18GX1X1/2 (RX/OR ONLY) (NEEDLE) ×1 IMPLANT
NDL HYPO 25GX1X1/2 BEV (NEEDLE) ×1 IMPLANT
NEEDLE 18GX1X1/2 (RX/OR ONLY) (NEEDLE) ×2 IMPLANT
NEEDLE HYPO 25GX1X1/2 BEV (NEEDLE) ×2 IMPLANT
NS IRRIG 1000ML POUR BTL (IV SOLUTION) ×4 IMPLANT
PACK GENERAL/GYN (CUSTOM PROCEDURE TRAY) ×2 IMPLANT
PAD ARMBOARD 7.5X6 YLW CONV (MISCELLANEOUS) ×3 IMPLANT
PEN SKIN MARKING BROAD (MISCELLANEOUS) IMPLANT
PENCIL BUTTON HOLSTER BLD 10FT (ELECTRODE) IMPLANT
PREFILTER EVAC NS 1 1/3-3/8IN (MISCELLANEOUS) ×2 IMPLANT
SPECIMEN JAR X LARGE (MISCELLANEOUS) ×2 IMPLANT
SPONGE GAUZE 4X4 12PLY (GAUZE/BANDAGES/DRESSINGS) ×2 IMPLANT
SPONGE LAP 18X18 X RAY DECT (DISPOSABLE) IMPLANT
STAPLER VISISTAT 35W (STAPLE) ×2 IMPLANT
SUT ETHILON 2 0 FS 18 (SUTURE) ×3 IMPLANT
SUT MNCRL AB 4-0 PS2 18 (SUTURE) ×2 IMPLANT
SUT PDS AB 0 CT 36 (SUTURE) IMPLANT
SUT PROLENE 3 0 PS 1 (SUTURE) IMPLANT
SUT SILK 2 0 FS (SUTURE) ×2 IMPLANT
SUT VIC AB 3-0 SH 18 (SUTURE) ×3 IMPLANT
SUT VIC AB 3-0 SH 8-18 (SUTURE) IMPLANT
SYR BULB IRRIGATION 50ML (SYRINGE) IMPLANT
SYR CONTROL 10ML LL (SYRINGE) ×2 IMPLANT
TOWEL OR 17X24 6PK STRL BLUE (TOWEL DISPOSABLE) ×3 IMPLANT
TOWEL OR 17X26 10 PK STRL BLUE (TOWEL DISPOSABLE) ×3 IMPLANT
TUBE CONNECTING 12X1/4 (SUCTIONS) ×3 IMPLANT
WATER STERILE IRR 1000ML POUR (IV SOLUTION) ×1 IMPLANT

## 2012-11-03 NOTE — Brief Op Note (Signed)
11/03/2012  12:41 PM  PATIENT:  Hannah Hale, 46 y.o., female, MRN: [id number]  PREOP DIAGNOSIS:  left breast cancer  POSTOP DIAGNOSIS:   Left breast cancer, DCIS, widespread microca++ (Tis,N0)  PROCEDURE:   Procedure(s): Left SIMPLE MASTECTOMY WITH Left AXILLARY SENTINEL NODE BIOPSY, injection of methylene blue (1 cc)  SURGEON:   Ovidio Kin, M.D.  ASSISTANT:   None  ANESTHESIA:   general  Aubery Lapping, MD - Anesthesiologist CRNA  General  EBL:  100  ml  BLOOD ADMINISTERED: none  DRAINS: 2 59F  LOCAL MEDICATIONS USED:   none  SPECIMEN:   Left breast, superior skin flap (long suture superior, short suture lateral), inferior skin flap (long suture inferior, short suture lateral), left axillary sentinel lymph node  (counts 1800/20, slightly blue)  COUNTS CORRECT:  YES  INDICATIONS FOR PROCEDURE:  NAIRI OSWALD is a 46 y.o. (DOB: 1966/09/16) AA female whose primary care physician is GUEST, Loretha Stapler, MD and comes for left mastectomy and left axillary SLNBx.   The indications and risks of the surgery were explained to the patient.  The risks include, but are not limited to, infection, bleeding, and nerve injury.  Note dictated to:   #[id number]

## 2012-11-03 NOTE — Anesthesia Procedure Notes (Signed)
Procedure Name: Intubation Date/Time: 11/03/2012 10:32 AM Performed by: Tyrone Nine Pre-anesthesia Checklist: Patient identified, Timeout performed, Emergency Drugs available, Suction available and Patient being monitored Patient Re-evaluated:Patient Re-evaluated prior to inductionOxygen Delivery Method: Circle system utilized Preoxygenation: Pre-oxygenation with 100% oxygen Intubation Type: IV induction Ventilation: Mask ventilation without difficulty Laryngoscope Size: Mac and 3 Tube type: Oral Tube size: 7.0 mm Number of attempts: 1 Airway Equipment and Method: Stylet Placement Confirmation: ETT inserted through vocal cords under direct vision,  positive ETCO2,  CO2 detector and breath sounds checked- equal and bilateral Secured at: 23 cm Tube secured with: Tape Dental Injury: Teeth and Oropharynx as per pre-operative assessment

## 2012-11-03 NOTE — Transfer of Care (Signed)
Immediate Anesthesia Transfer of Care Note  Patient: Hannah Hale  Procedure(s) Performed: Procedure(s) (LRB) with comments: SIMPLE MASTECTOMY WITH AXILLARY SENTINEL NODE BIOPSY (Left)  Patient Location: PACU  Anesthesia Type:General  Level of Consciousness: awake, alert , oriented and patient cooperative  Airway & Oxygen Therapy: Patient Spontanous Breathing and Patient connected to nasal cannula oxygen  Post-op Assessment: Report given to PACU RN and Post -op Vital signs reviewed and stable  Post vital signs: Reviewed and stable  Complications: No apparent anesthesia complications

## 2012-11-03 NOTE — Anesthesia Preprocedure Evaluation (Addendum)
Anesthesia Evaluation  Patient identified by MRN, date of birth, ID band Patient awake    Reviewed: Allergy & Precautions, H&P , NPO status , Patient's Chart, lab work & pertinent test results  Airway Mallampati: I TM Distance: >3 FB Neck ROM: Full    Dental  (+) Teeth Intact   Pulmonary Current Smoker,    Pulmonary exam normal       Cardiovascular Rhythm:Regular     Neuro/Psych Anxiety negative neurological ROS  negative psych ROS   GI/Hepatic negative GI ROS, Neg liver ROS, GERD-  Controlled,  Endo/Other  negative endocrine ROS  Renal/GU negative Renal ROS     Musculoskeletal negative musculoskeletal ROS (+)   Abdominal Normal abdominal exam  (+)   Peds  Hematology negative hematology ROS (+)   Anesthesia Other Findings   Reproductive/Obstetrics negative OB ROS                         Anesthesia Physical Anesthesia Plan  ASA: II  Anesthesia Plan: General   Post-op Pain Management:    Induction: Intravenous  Airway Management Planned: Oral ETT  Additional Equipment:   Intra-op Plan:   Post-operative Plan: Extubation in OR  Informed Consent: I have reviewed the patients History and Physical, chart, labs and discussed the procedure including the risks, benefits and alternatives for the proposed anesthesia with the patient or authorized representative who has indicated his/her understanding and acceptance.   Dental advisory given  Plan Discussed with: CRNA and Surgeon  Anesthesia Plan Comments:        Anesthesia Quick Evaluation

## 2012-11-03 NOTE — H&P (Signed)
Re: Hannah Hale  DOB: May 14, 1966  MRN: [id number]   BMDC   ASSESSMENT AND PLAN:  1. 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   I discussed the options for breast cancer treatment with the patient. I discussed the idea of a multidisciplinary approach to the treatment of breast cancer, which includes medical oncology and radiation oncology. I discussed the surgical options of lumpectomy vs. mastectomy. She will need a mastectomy and there is the possibility of reconstruction. I talked about how smoking will affect that decision. I discussed the options of lymph node biopsy. The treatment plan depends on the pathologic staging of the tumor and the patient's personal wishes.   The risks of surgery include, but are not limited to, bleeding, infection, the need for further surgery, and nerve injury.   The patient has been given literature on the treatment of breast cancer.   Plan: To get MRI tomorrow, to see Dr. Odis Luster 09/09/2012 about possible reconstruction, then will schedule surgery. She is seeing genetics also.  [MRI - 09/08/2012. The abnormal area in total measures 17.6(AP) x 8.8 (trv) x 9.4 (CC) cm. DN 09/13/2012]  [I spoke with Ms. Kizzie Bane. She has seen Dr. Odis Luster and plans immediate reconstruction with what sounds like a combined latissimus flap/implant. Will shcedule. DN 09/13/2012]  [The patient's hgb is 8.9. She does have heavy periods and has uterine fibroids. Dr. Odis Luster is concerned about immediate reconstruction, but the patient wants to continue with the plan for mastectomy. So will plan mastectomy without reconstruction on Thursday, 11/03/2012. DN 10/31/2012]   Patient's husband and sister are here today.  I put a T&Screen in Epic, but they cannot find it.  Will type and screen for surgery.  2. Smokes cigarettes - 1/2 pack per day  Going to try to quit.   REFERRING PHYSICIAN: GUEST, Loretha Stapler, MD    HISTORY OF PRESENT ILLNESS:  Hannah Hale is a 46 y.o. (DOB: [birth year]) AA female whose primary care physician is GUEST, Loretha Stapler, MD and comes to the [facility] for left breast cancer. Her husband and her sister, Hannah Hale (from [place]), are with her. She sees Dr. Mitchel Honour at [facility].  Ms. Frangella felt a lumpiness in her left breast recently. This prompted a mammogram. She has had no prior breast problems or surgery. This was her first mammogram. She still is having regular periods. LMP was 3 weeks. Has no children. No family history of breast cancer.   Past Medical History   Diagnosis  Date   .  Breast cancer    No past surgical history on file.  Current Outpatient Prescriptions   Medication  Sig  Dispense  Refill   .  diphenhydrAMINE (SOMINEX) 25 MG tablet  Take 25 mg by mouth at bedtime as needed.     No Known Allergies   REVIEW OF SYSTEMS:  Skin: No history of rash. No history of abnormal moles.  Infection: No history of hepatitis or HIV. No history of MRSA.  Neurologic: No history of stroke. No history of seizure. No history of headaches.  Cardiac: No history of hypertension. No history of heart disease. No history of prior cardiac catheterization. No history of seeing a cardiologist.  Pulmonary: Smokes 1/2 ppd. She knows this is bad for her health.  Endocrine: No diabetes. No thyroid disease.  Gastrointestinal: No history of stomach disease. No history of liver disease. No  history of gall bladder disease. No history of pancreas disease. No history of colon disease.  Urologic: No history of kidney stones. No history of bladder infections.  Musculoskeletal: No history of joint or back disease.  Hematologic: No bleeding disorder. No history of anemia. Not anticoagulated.  Psycho-social: The patient is oriented. The patient has no obvious psychologic or social impairment to understanding our conversation and plan.   SOCIAL and FAMILY HISTORY:  Married. Husband is with  her.  She works at [employer] as a Scientist, clinical (histocompatibility and immunogenetics).  Her sister, Hannah Hale, is with her.   PHYSICAL EXAM:  BP 163/87  Pulse 80  Temp 97.6 F (36.4 C)  Resp 18  SpO2 99%  LMP 10/18/2012  General: AA F who is alert and generally healthy appearing.  HEENT: Normal. Pupils equal.  Neck: Supple. No mass. No thyroid mass.  Lymph Nodes: No supraclavicular, cervical nodes, or axillary nodes.  Lungs: Clear to auscultation and symmetric breath sounds.  Heart: RRR. No murmur or rub.  Breasts: Left: Tender from biopsy, lumpiness at the 3 o'clock position. It is difficult for me to tell how much of a mass effect there is because of the tenderness. Acne LIQ.  Right: She has acne in the LIQ of both breasts.  Abdomen: Soft. No mass. No tenderness. No hernia. Normal bowel sounds. No abdominal scars.  Rectal: Not done.  Extremities: Good strength and ROM in upper and lower extremities.  Neurologic: Grossly intact to motor and sensory function.  Psychiatric: Has normal mood and affect. Behavior is normal.   DATA REVIEWED:  Mammogram and path. Path report to patient.   Ovidio Kin, MD, [facility]  [address]  [place], [place] [address]  Phone: [phone] FAX: [phone]

## 2012-11-03 NOTE — Progress Notes (Signed)
Pt did not go to breast center  Prior to arrival ... She was not told to go.   Nuclear medicine was called and advised of pt's arrival.

## 2012-11-03 NOTE — Preoperative (Signed)
Beta Blockers   Reason not to administer Beta Blockers:Not Applicable 

## 2012-11-03 NOTE — Anesthesia Postprocedure Evaluation (Signed)
Anesthesia Post Note  Patient: Hannah Hale  Procedure(s) Performed: Procedure(s) (LRB): SIMPLE MASTECTOMY WITH AXILLARY SENTINEL NODE BIOPSY (Left)  Anesthesia type: general  Patient location: PACU  Post pain: Pain level controlled  Post assessment: Patient's Cardiovascular Status Stable  Last Vitals:  Filed Vitals:   11/03/12 1400  BP:   Pulse: 81  Temp:   Resp: 14    Post vital signs: Reviewed and stable  Level of consciousness: sedated  Complications: No apparent anesthesia complications

## 2012-11-03 NOTE — OR Nursing (Signed)
Methylene blue injection at 1035.

## 2012-11-04 ENCOUNTER — Telehealth (INDEPENDENT_AMBULATORY_CARE_PROVIDER_SITE_OTHER): Payer: Self-pay

## 2012-11-04 ENCOUNTER — Encounter (HOSPITAL_COMMUNITY): Payer: Self-pay | Admitting: Surgery

## 2012-11-04 LAB — BASIC METABOLIC PANEL
BUN: 6 mg/dL (ref 6–23)
Chloride: 103 mEq/L (ref 96–112)
Creatinine, Ser: 0.59 mg/dL (ref 0.50–1.10)
Glucose, Bld: 133 mg/dL — ABNORMAL HIGH (ref 70–99)
Potassium: 3.5 mEq/L (ref 3.5–5.1)

## 2012-11-04 LAB — CBC
HCT: 26.9 % — ABNORMAL LOW (ref 36.0–46.0)
Hemoglobin: 8.1 g/dL — ABNORMAL LOW (ref 12.0–15.0)
MCHC: 30.1 g/dL (ref 30.0–36.0)
MCV: 71.9 fL — ABNORMAL LOW (ref 78.0–100.0)
WBC: 10.3 10*3/uL (ref 4.0–10.5)

## 2012-11-04 NOTE — Progress Notes (Signed)
Discharge instructions reviewed with pt and pt had prescription given by MD.  Pt verbalized understanding and had no questions.  Pt and pt's family member also verbalized understanding of JP drain care, emptying and measuring.  Pt discharged in stable condition via wheelchair with family.  Hannah Hale [place]

## 2012-11-04 NOTE — Op Note (Deleted)
Hannah Hale, Hannah Hale            ACCOUNT NO.:  [id number]  MEDICAL RECORD NO.:  [id number]  LOCATION:  NUC                          FACILITY:  [facility]  PHYSICIAN:  Sandria Bales. Ezzard Standing, M.D.  DATE OF BIRTH:  [birth year]  DATE OF PROCEDURE:  11/03/2012 DATE OF DISCHARGE:  11/04/2012                              OPERATIVE REPORT   This is an unedited report.  For final edited dictation, please contact [facility]/Liz Katrinka Blazing.  PREOPERATIVE DIAGNOSIS:  Left breast cancer, ductal carcinoma in situ.  POSTOPERATIVE DIAGNOSIS:  Left breast cancer, ductal carcinoma in situ, widespread microcalcifications (Tis, N0)  PROCEDURE:  Left simple mastectomy with left axillary sentinel lymph node biopsy, injection of methylene blue.  SURGEON:  Sandria Bales. Ezzard Standing, MD  FIRST ASSISTANT:  None.  ANESTHESIA:  General endotracheal supervised by Dr. Arta Bruce.  ESTIMATED BLOOD LOSS:  Less than 100 mL.  DRAINS:  Left in were two 19-French Blake drains.  No local anesthesia used.  INDICATION FOR PROCEDURE:  Hannah Hale is a 46 year old African-American female who sees Dr. Robert Bellow as her primary care doctor and Dr. Mitchel Honour from a GYN standpoint.  She has a biopsy-proven left breast ductal carcinoma in situ.  By mammogram, she had a widespread calcifications covering some 15 cm and thought to be a poor candidate for lumpectomy.  I discussed with her mastectomy.  She has seen Dr. Etter Sjogren in consultation for immediate breast reconstruction.  However, preoperatively she had a hemoglobin of 8.9 probably secondary to heavy periods which is being worked up by Dr. Langston Masker, and therefore we are just going ahead with a left mastectomy today and sentinel lymph node biopsy.  The indication and potential complications of surgery were explained to the patient.  Potential complications include, but not limited to, bleeding, infection, nerve injury, recurrence of the tumor.  She also has been  smoking and strongly encouraged to quit this.  OPERATIVE NOTE:  The patient was taken to room #10 at [facility]. She underwent a general anesthesia supervised by Dr. Arta Bruce.  Her left breast was prepped with ChloraPrep and sterilely draped.  In the holding area, she had been injected with 1 mCi of technetium sulfur colloid and in the OR injected with 1 mL of 40% methylene blue.  A time-out was held and surgical checklist run.  She was draped sterilely and I made an elliptical incision excising the areola and the breast. She has somewhat pendulous breast with somewhat redundant skin.  I dissected out to the axilla where I found a hot node that counts about 1800.  I excised this node and sent it to Pathology. It was faintly blue.  I then developed the skin flap superiorly right below the clavicle about 2 fingers below the clavicle, medially to the lateral edge of the sternum, inferiorly to the investing fascia, the rectus abdominis muscle, and laterally to the latissimus dorsi muscle.  I then reflected the breast off the chest wall.  I saw no evidence of gross tumor or anything that was suspicious.  I cut across.  I did try to excise some of the lower axilla and included the site of the sentinel lymph node biopsy.  We placed a long suture laterally on the left breast and this was sent for pathology.  She had again redundant flaps I excised.  Her superior flap was about 4 cm.  I placed a short suture lateral and a long suture cranially and then the inferior flap.  I also excised about 3 or 4 cm of skin.  Again, I placed a short suture laterally and a long suture inferiorly.  The wounds were then irrigated with saline about a liter.  I then placed two 19 Blake drains in the inferior and sewed these in place with 2-0 nylon sutures.  I have used 3-0 Vicryl sutures to hold the subcutaneous tissues.  I did use skin staples for the skin.  The skin came together well and lay  flat.  The Al Pimple was put to bulb suction.  Sponge and needle count were correct at the end of the case.  She tolerated the procedure well, was transported to recovery room with plan for overnight stay.     Sandria Bales. Ezzard Standing, M.D.     DHN/MEDQ  D:  11/03/2012  T:  11/04/2012  Job:  [id number]  cc:   Lurline Hare, M.D. Fax: [phone]  Pierce Crane, M.D., F.R.C.P.C. Fax: [phone]  Jonita Albee, M.D. Fax: [phone]  Mitchel Honour, DO

## 2012-11-04 NOTE — Telephone Encounter (Signed)
Pt aware of appt 11/14/12@3pm 

## 2012-11-04 NOTE — Op Note (Deleted)
NAME:  Hannah Hale, Hannah Hale            ACCOUNT NO.:  [id number]  MEDICAL RECORD NO.:  [id number]  LOCATION:  NUC                          FACILITY:  [facility]  PHYSICIAN:  Rayshad Riviello H. Dystany Duffy, M.D.  DATE OF BIRTH:  08/08/1966  DATE OF PROCEDURE:  11/03/2012 DATE OF DISCHARGE:  11/04/2012                              OPERATIVE REPORT   This is an unedited report.  For final edited dictation, please contact [facility]/Liz Smith.  PREOPERATIVE DIAGNOSIS:  Left breast cancer, ductal carcinoma in situ.  POSTOPERATIVE DIAGNOSIS:  Left breast cancer, ductal carcinoma in situ, widespread microcalcifications (Tis, N0)  PROCEDURE:  Left simple mastectomy with left axillary sentinel lymph node biopsy, injection of methylene blue.  SURGEON:  Luna Audia H. Linell Meldrum, MD  FIRST ASSISTANT:  None.  ANESTHESIA:  General endotracheal supervised by Dr. Kevin Ossey.  ESTIMATED BLOOD LOSS:  Less than 100 mL.  DRAINS:  Left in were two 19-French Blake drains.  No local anesthesia used.  INDICATION FOR PROCEDURE:  Hannah Hale is a 46-year-old African-American female who sees Dr. Chris Guest as her primary care doctor and Dr. Megan Morris from a GYN standpoint.  She has a biopsy-proven left breast ductal carcinoma in situ.  By mammogram, she had a widespread calcifications covering some 15 cm and thought to be a poor candidate for lumpectomy.  I discussed with her mastectomy.  She has seen Dr. Jerren Flinchbaugh Bowers in consultation for immediate breast reconstruction.  However, preoperatively she had a hemoglobin of 8.9 probably secondary to heavy periods which is being worked up by Dr. Morris, and therefore we are just going ahead with a left mastectomy today and sentinel lymph node biopsy.  The indication and potential complications of surgery were explained to the patient.  Potential complications include, but not limited to, bleeding, infection, nerve injury, recurrence of the tumor.  She also has been  smoking and strongly encouraged to quit this.  OPERATIVE NOTE:  The patient was taken to room #10 at [facility]. She underwent a general anesthesia supervised by Dr. Kevin Ossey.  Her left breast was prepped with ChloraPrep and sterilely draped.  In the holding area, she had been injected with 1 mCi of technetium sulfur colloid and in the OR injected with 1 mL of 40% methylene blue.  A time-out was held and surgical checklist run.  She was draped sterilely and I made an elliptical incision excising the areola and the breast. She has somewhat pendulous breast with somewhat redundant skin.  I dissected out to the axilla where I found a hot node that counts about 1800.  I excised this node and sent it to Pathology. It was faintly blue.  I then developed the skin flap superiorly right below the clavicle about 2 fingers below the clavicle, medially to the lateral edge of the sternum, inferiorly to the investing fascia, the rectus abdominis muscle, and laterally to the latissimus dorsi muscle.  I then reflected the breast off the chest wall.  I saw no evidence of gross tumor or anything that was suspicious.  I cut across.  I did try to excise some of the lower axilla and included the site of the sentinel lymph node biopsy.    We placed a long suture laterally on the left breast and this was sent for pathology.  She had again redundant flaps I excised.  Her superior flap was about 4 cm.  I placed a short suture lateral and a long suture cranially and then the inferior flap.  I also excised about 3 or 4 cm of skin.  Again, I placed a short suture laterally and a long suture inferiorly.  The wounds were then irrigated with saline about a liter.  I then placed two 19 Blake drains in the inferior and sewed these in place with 2-0 nylon sutures.  I have used 3-0 Vicryl sutures to hold the subcutaneous tissues.  I did use skin staples for the skin.  The skin came together well and lay  flat.  The Jackson Pratt was put to bulb suction.  Sponge and needle count were correct at the end of the case.  She tolerated the procedure well, was transported to recovery room with plan for overnight stay.     Mandy Fitzwater H. Arian Murley, M.D.     DHN/MEDQ  D:  11/03/2012  T:  11/04/2012  Job:  [id number]  cc:   Stacy Wentworth, M.D. Fax: [phone]  Peter Rubin, M.D., F.R.C.P.C. Fax: [phone]  Chris W. Guest, M.D. Fax: [phone]  Megan Morris, DO 

## 2012-11-04 NOTE — Discharge Summary (Signed)
Physician Discharge Summary  Patient ID:  Hannah Hale  MRN: [id number]  DOB/AGE: 45-Mar-1967 45 y.o.  Admit date: 11/03/2012 Discharge date: 11/04/2012  Discharge Diagnoses:  1. 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  Smokes cigarettes 3.  Fe deficiency anemia    Operation: Procedure(s):  Left SIMPLE MASTECTOMY WITH AXILLARY SENTINEL NODE BIOPSY on 11/03/2012  Discharged Condition: good  Hospital Course: Hannah Hale is an 46 y.o. female whose primary care physician is GUEST, Loretha Stapler, MD and who was admitted 11/03/2012 with a chief complaint of left breast cancer. She was brought to the operating room on 11/03/2012 and underwent  Left SIMPLE MASTECTOMY WITH AXILLARY SENTINEL NODE BIOPSY .   She is now 1 day post op and doing well. Her daughter is in the room with her.  She is ready for discharge.  Consults: None  Significant Diagnostic Studies: Results for orders placed during the hospital encounter of 11/03/12  TYPE AND SCREEN      Component Value Range   ABO/RH(D) O POS     Antibody Screen NEG     Sample Expiration 11/06/2012    ABO/RH      Component Value Range   ABO/RH(D) O POS    CBC      Component Value Range   WBC 10.3  4.0 - 10.5 K/uL   RBC 3.74 (*) 3.87 - 5.11 MIL/uL   Hemoglobin 8.1 (*) 12.0 - 15.0 g/dL   HCT 40.9 (*) 81.1 - 91.4 %   MCV 71.9 (*) 78.0 - 100.0 fL   MCH 21.7 (*) 26.0 - 34.0 pg   MCHC 30.1  30.0 - 36.0 g/dL   RDW 78.2 (*) 95.6 - 21.3 %   Platelets 269  150 - 400 K/uL  BASIC METABOLIC PANEL      Component Value Range   Sodium 136  135 - 145 mEq/L   Potassium 3.5  3.5 - 5.1 mEq/L   Chloride 103  96 - 112 mEq/L   CO2 22  19 - 32 mEq/L   Glucose, Bld 133 (*) 70 - 99 mg/dL   BUN 6  6 - 23 mg/dL   Creatinine, Ser 0.86  0.50 - 1.10 mg/dL   Calcium 8.4  8.4 - 57.8 mg/dL   GFR calc non Af Amer >90  >90 mL/min   GFR calc Af Amer >90  >90 mL/min    Discharge Exam:  Filed Vitals:   11/04/12 0538    BP: 144/86  Pulse: 85  Temp: 98.4 F (36.9 C)  Resp: 16    General: WN AA F who is alert and generally healthy appearing.  Lungs: Clear to auscultation and symmetric breath sounds. Heart:  RRR. No murmur or rub. Chest:  Dressing is dry.  Two drains with serous fluid.  Discharge Medications:     Medication List     As of 11/04/2012  7:45 AM    TAKE these medications         diphenhydrAMINE 25 MG tablet   Commonly known as: SOMINEX   Take 50 mg by mouth 2 (two) times daily as needed. For itching      guaiFENesin 100 MG/5ML Soln   Commonly known as: ROBITUSSIN   Take 5 mLs by mouth every 4 (four) hours as needed.      NYQUIL PO   Take by mouth.        Disposition: Final discharge disposition not confirmed  Discharge Orders    Future Appointments: Provider: Department: Dept Phone: Center:   11/17/2012 1:30 PM Dava Najjar Idelle Jo [facility] MEDICAL ONCOLOGY [phone] None   11/17/2012 2:00 PM Pierce Crane, MD [facility] MEDICAL ONCOLOGY [phone] None     Future Orders Please Complete By Expires   Diet - low sodium heart healthy      Increase activity slowly        Discharge Instructions Return to work on: 03 December 2012.  Activity: Driving - May drive in 2 or 3 days, if doing well.   Lifting - Take it easy for 5 days, then no limit.  Wound Care: Leave incision dry until Sunday, then remove bandage and shower.  Diet: Regular diet  Follow up appointment: Call Dr. Allene Pyo office [facility]) at [phone] for an appointment in 10 -14 days  Medications and dosages:   Resume your home medications.   You have a prescription for: Vicodin.   Get an over the counter vitamin with Iron and take daily   Signed: Ovidio Kin, M.D., FACS  11/04/2012, 7:45 AM

## 2012-11-04 NOTE — Op Note (Signed)
NAMEKELLER, OEHLER            ACCOUNT NO.:  [id number]  MEDICAL RECORD NO.:  [id number]  LOCATION:  NUC                          FACILITY:  [facility]  PHYSICIAN:  Sandria Bales. Ezzard Standing, M.D.  DATE OF BIRTH:  02-Nov-1966  DATE OF PROCEDURE:  11/03/2012                              OPERATIVE REPORT   PREOPERATIVE DIAGNOSIS:  Left breast cancer, ductal carcinoma in situ.  POSTOPERATIVE DIAGNOSIS:  Left breast cancer, ductal carcinoma in situ, widespread microcalcifications (Tis, N0)  PROCEDURE:  Left simple mastectomy with left axillary sentinel lymph node biopsy, injection of methylene blue.  SURGEON:  Sandria Bales. Ezzard Standing, MD  FIRST ASSISTANT:  None.  ANESTHESIA:  General endotracheal supervised by Dr. Arta Bruce.  ESTIMATED BLOOD LOSS:  Less than 100 mL.  DRAINS:  Left in were two 19-French Blake drains.  No local anesthesia used.  INDICATION FOR PROCEDURE:  Ms. Sauer is a 46 year old African-American female who sees Dr. Robert Bellow as her primary care doctor and Dr. Mitchel Honour from a GYN standpoint.  She has a biopsy-proven left breast ductal carcinoma in situ.  By mammogram, she had a widespread calcifications covering some 15 cm and thought to be a poor candidate for lumpectomy.  I discussed with her mastectomy.  She has seen Dr. Etter Sjogren in consultation for immediate breast reconstruction.  However, preoperatively she had a hemoglobin of 8.9 probably secondary to heavy periods which is being worked up by Dr. Langston Masker, and therefore we are just going ahead with a left mastectomy today and sentinel lymph node biopsy.  The indication and potential complications of surgery were explained to the patient.  Potential complications include, but not limited to, bleeding, infection, nerve injury, recurrence of the tumor.  She also has been smoking and strongly encouraged to quit this.  OPERATIVE NOTE:  The patient was taken to room #10 at [facility]. She underwent a general  anesthesia supervised by Dr. Arta Bruce.  Her left breast was prepped with ChloraPrep and sterilely draped.  In the holding area, her left peri-areolar area had been injected with 1 mCi of technetium sulfur colloid and in the OR injected I injected the left peri-areolar area with 1 mL of 40% methylene blue.  A time-out was held and surgical checklist run.  She was draped sterilely and I made an elliptical incision excising the areola and the breast. She has somewhat pendulous breast with somewhat redundant skin.  I dissected out to the axilla where I found a hot node that counts about 1800.  I excised this node and sent it to Pathology. It was faintly blue.  I then developed the skin flap superiorly right below the clavicle about 2 fingers below the clavicle, medially to the lateral edge of the sternum, inferiorly to the investing fascia, the rectus abdominis muscle, and laterally to the latissimus dorsi muscle.  I then reflected the breast off the chest wall.  I saw no evidence of gross tumor or anything that was suspicious.  I cut across.  I did try to excise some of the lower axilla and included the site of the sentinel lymph node biopsy.  We placed a long suture laterally on the left breast and this  was sent for pathology.  She had again redundant flaps I excised.  Her superior flap was about 4 cm.  I placed a short suture lateral and a long suture cranially and then the inferior flap.  I also excised about 3 or 4 cm of skin.  Again, I placed a short suture laterally and a long suture inferiorly.  The wounds were then irrigated with saline about a liter.  I then placed two 19 Blake drains in the inferior and sewed these in place with 2-0 nylon sutures.  I have used 3-0 Vicryl sutures to hold the subcutaneous tissues.  I did use skin staples for the skin.  The skin came together well and lay flat.  The Al Pimple was put to bulb suction.  Sponge and needle count were  correct at the end of the case.  She tolerated the procedure well, was transported to recovery room with plan for overnight stay.   Sandria Bales. Ezzard Standing, M.D., FACS   DHN/MEDQ  D:  11/03/2012  T:  11/04/2012  Job:  [id number]  cc:   Lurline Hare, M.D. Fax: [phone]  Pierce Crane, M.D., F.R.C.P.C. Fax: [phone]  Jonita Albee, M.D. Fax: [phone]  Mitchel Honour, DO

## 2012-11-08 ENCOUNTER — Telehealth (INDEPENDENT_AMBULATORY_CARE_PROVIDER_SITE_OTHER): Payer: Self-pay

## 2012-11-08 NOTE — Telephone Encounter (Signed)
Patient is aware of appt for 11/14/12 @ 3p Dr. Ezzard Standing will discuss Path report at that time.

## 2012-11-08 NOTE — Telephone Encounter (Signed)
Pt calling for path result from Thursday. Please call pt at [phone].

## 2012-11-14 ENCOUNTER — Encounter (INDEPENDENT_AMBULATORY_CARE_PROVIDER_SITE_OTHER): Payer: Self-pay | Admitting: Surgery

## 2012-11-14 ENCOUNTER — Ambulatory Visit (INDEPENDENT_AMBULATORY_CARE_PROVIDER_SITE_OTHER): Payer: 59 | Admitting: Surgery

## 2012-11-14 VITALS — BP 150/94 | HR 78 | Temp 97.4°F | Resp 16 | Ht 64.0 in | Wt 208.0 lb

## 2012-11-14 DIAGNOSIS — C50419 Malignant neoplasm of upper-outer quadrant of unspecified female breast: Secondary | ICD-10-CM

## 2012-11-14 NOTE — Progress Notes (Addendum)
 Re:   Hannah Hale DOB:   08/28/1966 MRN:   [id number]  [facility]  ASSESSMENT AND PLAN: 1.  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  LVI identified.  1/2 lymph nodes involved.  T3, N1., ER - neg, PR - neg., Ki67 - 30% (Stage IIIA)  She is set to see Dr. Rubin back on 11/17/2012.  She has no appt with Dr. Wentworth.   1a.  I discussed with her about a power port, anticipating Dr. Rubin's discussion with her about chemotx.  And I talked about possible left axillary completion lymph node dissection.  The risks include, but are not limited to, bleeding, infection, thrombosis, nerve injury, and pneumothorax.  I will be in touch with her this Friday, 12/6, after she has seen Dr. Rubin.  [Discussed with Drs. Wentworth/Rubin - plan metastatic work up --> then port and possible completion axillary dissection (depending on met work up)  DN  11/15/12]  2.  Smokes cigarettes - 1/2 pack per day  Going to try to quit. 3.  Chronic anemia.  REFERRING PHYSICIAN: No primary provider on file.  HISTORY OF PRESENT ILLNESS: Hannah Hale is a 46 y.o. (DOB: 04/14/1966)  AA female whose primary care physician is No primary provider on file. and comes to the [facility] for left breast cancer.  Her husband and her sister, Hannah Hale (from [place]), are with her.  She sees Dr. Megan Morris at [facility].  She comes today for followup of her left mastectomy.  I spent a good amount of time reviewing her path and its implications.  Her drain for both drains is <20cc.  History of left breast cancer: [name] felt a lumpiness in her left breast recently.  This prompted a mammogram.  She has had no prior breast problems or surgery.  This was her first mammogram. She still is having regular periods. LMP was 3 weeks.  Has no children.  No family history of breast cancer. MRI - 09/08/2012.  The abnormal area in total measures  17.6(AP) x 8.8 (trv) x 9.4 (CC) cm.   Past Medical History  Diagnosis Date  . Breast cancer   . Coughing     cold recent      Current Outpatient Prescriptions  Medication Sig Dispense Refill  . diphenhydrAMINE (SOMINEX) 25 MG tablet Take 50 mg by mouth 2 (two) times daily as needed. For itching      . guaiFENesin (ROBITUSSIN) 100 MG/5ML SOLN Take 5 mLs by mouth every 4 (four) hours as needed.      . HYDROcodone-acetaminophen (NORCO/VICODIN) 5-325 MG per tablet Take 1 tablet by mouth every 6 (six) hours as needed.      . Pseudoeph-Doxylamine-DM-APAP (NYQUIL PO) Take by mouth.         No Known Allergies  REVIEW OF SYSTEMS: Skin:  No history of rash.  No history of abnormal moles. Infection:  No history of hepatitis or HIV.  No history of MRSA. Neurologic:  No history of stroke.  No history of seizure.  No history of headaches. Cardiac:  No history of hypertension. No history of heart disease.  No history of prior cardiac catheterization.  No history of seeing a cardiologist. Pulmonary:  Smokes 1/2 ppd. She knows this is bad for her health.  Endocrine:  No diabetes. No thyroid disease. Gastrointestinal:  No history of stomach disease.  No history   of liver disease.  No history of gall bladder disease.  No history of pancreas disease.  No history of colon disease. Urologic:  No history of kidney stones.  No history of bladder infections. Musculoskeletal:  No history of joint or back disease. Hematologic:  No bleeding disorder.  No history of anemia.  Not anticoagulated. Psycho-social:  The patient is oriented.   The patient has no obvious psychologic or social impairment to understanding our conversation and plan.  SOCIAL and FAMILY HISTORY: Married.  Husband is with her. 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 Her sister, Hannah Hale, is with her.  PHYSICAL EXAM: BP 150/94  Pulse 78  Temp 97.4 F (36.3 C)  Resp 16  Ht 5' 4" (1.626 m)  Wt 208 lb (94.348 kg)  BMI 35.70 kg/m2  LMP  10/18/2012   General: AA F who is alert and generally healthy appearing.  Breasts:  Left:  Absent.  Wound looks good.  Staples removed and drains removed.  Right:  She has acne in the LIQ of both breasts.  DATA REVIEWED: Path report to patient.  Lindbergh Winkles, MD,  FACS [facility] [address]  [address]   [place], [place]    [address] Phone:  [phone] FAX:  [phone]  

## 2012-11-16 ENCOUNTER — Other Ambulatory Visit: Payer: Self-pay | Admitting: *Deleted

## 2012-11-16 DIAGNOSIS — C50419 Malignant neoplasm of upper-outer quadrant of unspecified female breast: Secondary | ICD-10-CM

## 2012-11-17 ENCOUNTER — Other Ambulatory Visit (HOSPITAL_BASED_OUTPATIENT_CLINIC_OR_DEPARTMENT_OTHER): Payer: 59 | Admitting: Lab

## 2012-11-17 ENCOUNTER — Ambulatory Visit (HOSPITAL_BASED_OUTPATIENT_CLINIC_OR_DEPARTMENT_OTHER): Payer: 59 | Admitting: Oncology

## 2012-11-17 ENCOUNTER — Telehealth: Payer: Self-pay | Admitting: *Deleted

## 2012-11-17 VITALS — BP 178/104 | HR 77 | Temp 98.8°F | Resp 20 | Ht 64.0 in | Wt 207.0 lb

## 2012-11-17 DIAGNOSIS — C50419 Malignant neoplasm of upper-outer quadrant of unspecified female breast: Secondary | ICD-10-CM

## 2012-11-17 DIAGNOSIS — D649 Anemia, unspecified: Secondary | ICD-10-CM

## 2012-11-17 DIAGNOSIS — Z171 Estrogen receptor negative status [ER-]: Secondary | ICD-10-CM

## 2012-11-17 LAB — CBC WITH DIFFERENTIAL/PLATELET
Eosinophils Absolute: 0.3 10*3/uL (ref 0.0–0.5)
LYMPH%: 22.6 % (ref 14.0–49.7)
MONO#: 1 10*3/uL — ABNORMAL HIGH (ref 0.1–0.9)
NEUT#: 9.4 10*3/uL — ABNORMAL HIGH (ref 1.5–6.5)
Platelets: 436 10*3/uL — ABNORMAL HIGH (ref 145–400)
RBC: 4.19 10*6/uL (ref 3.70–5.45)
WBC: 13.9 10*3/uL — ABNORMAL HIGH (ref 3.9–10.3)

## 2012-11-17 LAB — IRON AND TIBC
%SAT: 28 % (ref 20–55)
Iron: 128 ug/dL (ref 42–145)
UIBC: 331 ug/dL (ref 125–400)

## 2012-11-17 LAB — FERRITIN: Ferritin: 7 ng/mL — ABNORMAL LOW (ref 10–291)

## 2012-11-17 NOTE — Progress Notes (Signed)
Hematology and Oncology Follow Up Visit  Hannah Hale [id number] 10-26-1966 46 y.o. 11/17/2012 3:26 PM   DIAGNOSIS:   Encounter Diagnosis  Name Primary?  . Cancer of upper-outer quadrant of female breast, Left. Yes     PAST THERAPY: Initial diagnosis of DCIS, s/p recent MRM   Interim History:  Pathology from MRM 1. Lymph node, sentinel, biopsy, Left #1 - METASTATIC CARCINOMA IN 1 OF 1 LYMPH NODE (1/1). 2. Breast, simple mastectomy, Left - INVASIVE DUCTAL CARCINOMA, GRADE II/III, SPANNING 15.0 CM. - DUCTAL CARCINOMA IN SITU WITH CALCIFICATIONS, INTERMEDIATE TO HIGH GRADE. - LYMPHOVASCULAR INVASION IS IDENTIFIED. - THE SURGICAL RESECTION MARGINS ARE NEGATIVE FOR CARCINOMA - THERE IS NO EVIDENCE OF CARCINOMA IN 1 OF 1 LYMPH NODE (0/1). - SEE ONCOLOGY TABLE BELOW. 3. Breast, excision, Left superior flap - BENIGN SKIN. - THERE IS NO EVIDENCE OF MALIGNANCY. 4. Breast, excision, Left inferior flap - BENIGN SKIN. - THERE IS NO EVIDENCE OF MALIGNANCY   She did not have reconstruction secondary to ongoing anemia.she had her drains removed recently. She has no other complaints.  Medications: I have reviewed the patient's current medications.  Allergies: No Known Allergies  Past Medical History, Surgical history, Social history, and Family History were reviewed and updated.  Review of Systems: Constitutional:  Negative for fever, chills, night sweats, anorexia, weight loss, pain. Cardiovascular: no chest pain or dyspnea on exertion Respiratory: negative Neurological: negative Dermatological: negative ENT: negative Skin Gastrointestinal: negative Genito-Urinary: negative Hematological and Lymphatic: negative Breast: negative Musculoskeletal: negative Remaining ROS negative.  Physical Exam:  Blood pressure 178/104, pulse 77, temperature 98.8 F (37.1 C), temperature source Oral, resp. rate 20, height 5\' 4"  (1.626 m), weight 207 lb (93.895 kg), last menstrual period  10/18/2012.  ECOG: 0      Lab Results: Lab Results  Component Value Date   WBC 13.9* 11/17/2012   HGB 9.1* 11/17/2012   HCT 30.5* 11/17/2012   MCV 72.8* 11/17/2012   PLT 436* 11/17/2012     Chemistry      Component Value Date/Time   NA 136 11/04/2012 0637   NA 137 09/07/2012 0834   K 3.5 11/04/2012 0637   K 3.7 09/07/2012 0834   CL 103 11/04/2012 0637   CL 105 09/07/2012 0834   CO2 22 11/04/2012 0637   CO2 21* 09/07/2012 0834   BUN 6 11/04/2012 0637   BUN 11.0 09/07/2012 0834   CREATININE 0.59 11/04/2012 0637   CREATININE 0.8 09/07/2012 0834      Component Value Date/Time   CALCIUM 8.4 11/04/2012 0637   CALCIUM 9.5 09/07/2012 0834   ALKPHOS 134 09/07/2012 0834   AST 21 09/07/2012 0834   ALT 15 09/07/2012 0834   BILITOT 0.40 09/07/2012 0834       Radiological Studies:  Dg Chest 2 View  10/26/2012  *RADIOLOGY REPORT*  Clinical Data: Breast cancer.  Preoperative respiratory exam.  CHEST - 2 VIEW  Comparison: None.  Findings: There is borderline cardiomegaly.  Pulmonary vascularity is normal.  Lungs are clear.  No significant osseous abnormality.  IMPRESSION: Borderline cardiomegaly.   Original Report Authenticated By: Francene Boyers, M.D.    Nm Sentinel Node Inj-no Rpt (breast)  11/03/2012  CLINICAL DATA: left breast cancer   Sulfur colloid was injected intradermally by the nuclear medicine  technologist for breast cancer sentinel node localization.       IMPRESSIONS AND PLAN: A 46 y.o. female with   Recent diagnosis of DCIS now with multicentric high grade triple negative  breast cancer, over an area of 15 cm with 1/2 involved nodes. We had a long discussion re staging scans and chemotherapy.Marland Kitchen  She will need a node dissection after PET confirms no distant disease. I will see her in 2 weeks to confirm the plan.  Spent more than half the time coordinating care, as well as discussion of BMI and its implications.      Hannah Hale 12/5/20133:26 PM Cell [phone]

## 2012-11-17 NOTE — Telephone Encounter (Addendum)
Gave patient appointment 12-01-2012  Gave patient appointment for 01-26-2013 genetics counseling  email michelle to set up IV IRON on 11-24-2012 after the chemo class

## 2012-11-18 ENCOUNTER — Telehealth: Payer: Self-pay | Admitting: *Deleted

## 2012-11-18 ENCOUNTER — Other Ambulatory Visit (INDEPENDENT_AMBULATORY_CARE_PROVIDER_SITE_OTHER): Payer: Self-pay | Admitting: Surgery

## 2012-11-18 NOTE — Telephone Encounter (Signed)
Per staff message and POF I have scheduled appt.  JMW  

## 2012-11-23 ENCOUNTER — Encounter (HOSPITAL_COMMUNITY)
Admission: RE | Admit: 2012-11-23 | Discharge: 2012-11-23 | Disposition: A | Discharge status: Home / Self Care | Payer: 59 | Source: Ambulatory Visit | Attending: Oncology | Admitting: Oncology

## 2012-11-23 ENCOUNTER — Telehealth (INDEPENDENT_AMBULATORY_CARE_PROVIDER_SITE_OTHER): Payer: Self-pay

## 2012-11-23 DIAGNOSIS — Y836 Removal of other organ (partial) (total) as the cause of abnormal reaction of the patient, or of later complication, without mention of misadventure at the time of the procedure: Secondary | ICD-10-CM | POA: Insufficient documentation

## 2012-11-23 DIAGNOSIS — I517 Cardiomegaly: Secondary | ICD-10-CM | POA: Insufficient documentation

## 2012-11-23 DIAGNOSIS — K449 Diaphragmatic hernia without obstruction or gangrene: Secondary | ICD-10-CM | POA: Insufficient documentation

## 2012-11-23 DIAGNOSIS — IMO0002 Reserved for concepts with insufficient information to code with codable children: Secondary | ICD-10-CM | POA: Insufficient documentation

## 2012-11-23 DIAGNOSIS — C50419 Malignant neoplasm of upper-outer quadrant of unspecified female breast: Secondary | ICD-10-CM

## 2012-11-23 DIAGNOSIS — D649 Anemia, unspecified: Secondary | ICD-10-CM

## 2012-11-23 DIAGNOSIS — C50919 Malignant neoplasm of unspecified site of unspecified female breast: Secondary | ICD-10-CM | POA: Insufficient documentation

## 2012-11-23 DIAGNOSIS — Z9010 Acquired absence of unspecified breast and nipple: Secondary | ICD-10-CM | POA: Insufficient documentation

## 2012-11-23 DIAGNOSIS — D259 Leiomyoma of uterus, unspecified: Secondary | ICD-10-CM | POA: Insufficient documentation

## 2012-11-23 DIAGNOSIS — I700 Atherosclerosis of aorta: Secondary | ICD-10-CM | POA: Insufficient documentation

## 2012-11-23 NOTE — Telephone Encounter (Signed)
Pt aware of P/O appt for 12/21/12 @ 1015am  She will be here 30 minutes early.

## 2012-11-24 ENCOUNTER — Encounter: Payer: Self-pay | Admitting: *Deleted

## 2012-11-24 ENCOUNTER — Telehealth: Payer: Self-pay | Admitting: Oncology

## 2012-11-24 ENCOUNTER — Telehealth: Payer: Self-pay | Admitting: Genetic Counselor

## 2012-11-24 ENCOUNTER — Other Ambulatory Visit: Payer: 59

## 2012-11-24 NOTE — Telephone Encounter (Signed)
Left message about appointment

## 2012-11-24 NOTE — Telephone Encounter (Signed)
gve a copy of the referral sheet to mrs Hannah Hale for the pt's echo appt to precert if needed.

## 2012-11-25 ENCOUNTER — Ambulatory Visit (HOSPITAL_COMMUNITY)
Admission: RE | Admit: 2012-11-25 | Discharge: 2012-11-25 | Disposition: A | Discharge status: Home / Self Care | Payer: 59 | Source: Ambulatory Visit | Attending: Oncology | Admitting: Oncology

## 2012-11-25 DIAGNOSIS — D259 Leiomyoma of uterus, unspecified: Secondary | ICD-10-CM | POA: Insufficient documentation

## 2012-11-25 DIAGNOSIS — Y836 Removal of other organ (partial) (total) as the cause of abnormal reaction of the patient, or of later complication, without mention of misadventure at the time of the procedure: Secondary | ICD-10-CM | POA: Insufficient documentation

## 2012-11-25 DIAGNOSIS — K449 Diaphragmatic hernia without obstruction or gangrene: Secondary | ICD-10-CM | POA: Insufficient documentation

## 2012-11-25 DIAGNOSIS — C50919 Malignant neoplasm of unspecified site of unspecified female breast: Secondary | ICD-10-CM | POA: Insufficient documentation

## 2012-11-25 DIAGNOSIS — K7689 Other specified diseases of liver: Secondary | ICD-10-CM | POA: Insufficient documentation

## 2012-11-25 DIAGNOSIS — I7090 Unspecified atherosclerosis: Secondary | ICD-10-CM | POA: Insufficient documentation

## 2012-11-25 DIAGNOSIS — Z9010 Acquired absence of unspecified breast and nipple: Secondary | ICD-10-CM | POA: Insufficient documentation

## 2012-11-25 DIAGNOSIS — I517 Cardiomegaly: Secondary | ICD-10-CM | POA: Insufficient documentation

## 2012-11-25 DIAGNOSIS — IMO0002 Reserved for concepts with insufficient information to code with codable children: Secondary | ICD-10-CM | POA: Insufficient documentation

## 2012-11-25 MED ORDER — FLUDEOXYGLUCOSE F - 18 (FDG) INJECTION
18.7000 | Freq: Once | INTRAVENOUS | Status: AC | PRN
  Administered 2012-11-25: 18.7 via INTRAVENOUS

## 2012-11-28 ENCOUNTER — Telehealth: Payer: Self-pay | Admitting: Genetic Counselor

## 2012-11-28 NOTE — Telephone Encounter (Signed)
Called to see if the patient had changed her mind about genetic testing, as she had declined it in September.  She was unaware that the appointment had been made and asked that it be cancelled.

## 2012-11-30 ENCOUNTER — Other Ambulatory Visit: Payer: Self-pay | Admitting: *Deleted

## 2012-11-30 DIAGNOSIS — C50419 Malignant neoplasm of upper-outer quadrant of unspecified female breast: Secondary | ICD-10-CM

## 2012-12-01 ENCOUNTER — Encounter (HOSPITAL_COMMUNITY): Payer: Self-pay | Admitting: Pharmacy Technician

## 2012-12-01 ENCOUNTER — Other Ambulatory Visit (HOSPITAL_BASED_OUTPATIENT_CLINIC_OR_DEPARTMENT_OTHER): Payer: 59 | Admitting: Lab

## 2012-12-01 ENCOUNTER — Ambulatory Visit (HOSPITAL_BASED_OUTPATIENT_CLINIC_OR_DEPARTMENT_OTHER): Payer: 59

## 2012-12-01 ENCOUNTER — Ambulatory Visit (HOSPITAL_BASED_OUTPATIENT_CLINIC_OR_DEPARTMENT_OTHER): Payer: 59 | Admitting: Oncology

## 2012-12-01 VITALS — BP 155/85 | HR 79

## 2012-12-01 VITALS — BP 151/82 | HR 82 | Temp 98.6°F | Resp 20 | Ht 64.0 in | Wt 207.0 lb

## 2012-12-01 DIAGNOSIS — Z170 Estrogen receptor positive status [ER+]: Secondary | ICD-10-CM

## 2012-12-01 DIAGNOSIS — C50419 Malignant neoplasm of upper-outer quadrant of unspecified female breast: Secondary | ICD-10-CM

## 2012-12-01 DIAGNOSIS — D649 Anemia, unspecified: Secondary | ICD-10-CM

## 2012-12-01 LAB — CBC WITH DIFFERENTIAL/PLATELET
BASO%: 0.9 % (ref 0.0–2.0)
LYMPH%: 23.5 % (ref 14.0–49.7)
MCH: 22.2 pg — ABNORMAL LOW (ref 25.1–34.0)
MCHC: 31.2 g/dL — ABNORMAL LOW (ref 31.5–36.0)
MCV: 71.3 fL — ABNORMAL LOW (ref 79.5–101.0)
MONO%: 5.2 % (ref 0.0–14.0)
Platelets: 269 10*3/uL (ref 145–400)
RBC: 4.06 10*6/uL (ref 3.70–5.45)

## 2012-12-01 LAB — COMPREHENSIVE METABOLIC PANEL (CC13)
ALT: 15 U/L (ref 0–55)
Alkaline Phosphatase: 124 U/L (ref 40–150)
Sodium: 141 mEq/L (ref 136–145)
Total Bilirubin: <0.2 mg/dL (ref 0.20–1.20)
Total Protein: 7.1 g/dL (ref 6.4–8.3)

## 2012-12-01 MED ORDER — SODIUM CHLORIDE 0.9 % IV SOLN
1020.0000 mg | Freq: Once | INTRAVENOUS | Status: AC
  Administered 2012-12-01: 1020 mg via INTRAVENOUS
  Filled 2012-12-01: qty 34

## 2012-12-01 MED ORDER — SODIUM CHLORIDE 0.9 % IV SOLN
Freq: Once | INTRAVENOUS | Status: AC
  Administered 2012-12-01: 10:00:00 via INTRAVENOUS

## 2012-12-01 NOTE — Progress Notes (Signed)
Hematology and Oncology Follow Up Visit  Hannah Hale [id number] 13-Nov-1966 46 y.o. 12/01/2012 8:52 AM   DIAGNOSIS:  Multicentric breast cancer-triple negative Iron deficiency anemia   PAST THERAPY:  Initial diagnosis of DCIS, s/p recent MRM-G2 , triple negative breast cancer over 15 cm area, planning for chemo., N+ PET staging negative    Interim History:  She is due for node dissection and port placement 12/08/12.  She is due for iron infusion today.    She did not have reconstruction secondary to ongoing anemia.she had her drains removed recently. She has no other complaints.  Medications: I have reviewed the patient's current medications.  Allergies: No Known Allergies  Past Medical History, Surgical history, Social history, and Family History were reviewed and updated.  Review of Systems: Constitutional:  Negative for fever, chills, night sweats, anorexia, weight loss, pain. Cardiovascular: no chest pain or dyspnea on exertion Respiratory: negative Neurological: negative Dermatological: negative ENT: negative Skin Gastrointestinal: negative Genito-Urinary: negative Hematological and Lymphatic: negative Breast: negative Musculoskeletal: negative Remaining ROS negative.  Physical Exam:  Blood pressure 151/82, pulse 82, temperature 98.6 F (37 C), resp. rate 20, height 5\' 4"  (1.626 m), weight 207 lb (93.895 kg), last menstrual period 11/01/2012.  ECOG: 0  HEENT:  Sclerae anicteric, conjunctivae pink.  Oropharynx clear.  No mucositis or candidiasis.  Nodes:  No cervical, supraclavicular, or axillary lymphadenopathy palpated.  Breast Exam:  Right breast is benign.  No masses, discharge, skin change, or nipple inversion.  Left breast is surgically absent, chest wall exam is unremarkable.  Lungs:  Clear to auscultation bilaterally.  No crackles, rhonchi, or wheezes.  Heart:  Regular rate and rhythm.  Abdomen:  Soft, nontender.  Positive bowel sounds.  No  organomegaly or masses palpated.  Musculoskeletal:  No focal spinal tenderness to palpation.  Extremities:  Benign.  No peripheral edema or cyanosis.  Skin:  Benign.  Neuro:  Nonfocal.     Lab Results: Lab Results  Component Value Date   WBC 11.3* 12/01/2012   HGB 9.0* 12/01/2012   HCT 28.9* 12/01/2012   MCV 71.3* 12/01/2012   PLT 269 12/01/2012     Chemistry      Component Value Date/Time   NA 136 11/04/2012 0637   NA 137 09/07/2012 0834   K 3.5 11/04/2012 0637   K 3.7 09/07/2012 0834   CL 103 11/04/2012 0637   CL 105 09/07/2012 0834   CO2 22 11/04/2012 0637   CO2 21* 09/07/2012 0834   BUN 6 11/04/2012 0637   BUN 11.0 09/07/2012 0834   CREATININE 0.59 11/04/2012 0637   CREATININE 0.8 09/07/2012 0834      Component Value Date/Time   CALCIUM 8.4 11/04/2012 0637   CALCIUM 9.5 09/07/2012 0834   ALKPHOS 134 09/07/2012 0834   AST 21 09/07/2012 0834   ALT 15 09/07/2012 0834   BILITOT 0.40 09/07/2012 0834       Radiological Studies:  Dg Chest 2 View  10/26/2012  *RADIOLOGY REPORT*  Clinical Data: Breast cancer.  Preoperative respiratory exam.  CHEST - 2 VIEW  Comparison: None.  Findings: There is borderline cardiomegaly.  Pulmonary vascularity is normal.  Lungs are clear.  No significant osseous abnormality.  IMPRESSION: Borderline cardiomegaly.   Original Report Authenticated By: Francene Boyers, M.D.    Nm Sentinel Node Inj-no Rpt (breast)  11/03/2012  CLINICAL DATA: left breast cancer   Sulfur colloid was injected intradermally by the nuclear medicine  technologist for breast cancer sentinel node localization.  IMPRESSIONS AND PLAN: A 46 y.o. female with   Recent diagnosis of DCIS now with multicentric high grade triple negative breast cancer, over an area of 15 cm with 1/2 involved nodes. She will have a node dissection and port placed on the 26th. She will return in the 1st wk of January to initiate therapy. She still needs an echo . My recommendations are FEC q 3 weeks  x 3 followed by taxotere q 3 weeks x3. certainly this can depend on the number of nodes involved. Spent more than half the time coordinating care, as well as discussion of BMI and its implications.      [name] 12/19/20138:52 AM Cell [phone]

## 2012-12-01 NOTE — Patient Instructions (Signed)
Today you received Ferumoxytol injection What is this medicine? FERUMOXYTOL is an iron complex. Iron is used to make healthy red blood cells, which carry oxygen and nutrients throughout the body. This medicine is used to treat iron deficiency anemia in people with chronic kidney disease. This medicine may be used for other purposes; ask your health care provider or pharmacist if you have questions. What should I tell my health care provider before I take this medicine? They need to know if you have any of these conditions: -anemia not caused by low iron levels -high levels of iron in the blood -magnetic resonance imaging (MRI) test scheduled -an unusual or allergic reaction to iron, other medicines, foods, dyes, or preservatives -pregnant or trying to get pregnant -breast-feeding How should I use this medicine? This medicine is for infusion into a vein. It is given by a health care professional in a hospital or clinic setting. Talk to your pediatrician regarding the use of this medicine in children. Special care may be needed. Overdosage: If you think you've taken too much of this medicine contact a poison control center or emergency room at once. Overdosage: If you think you have taken too much of this medicine contact a poison control center or emergency room at once. NOTE: This medicine is only for you. Do not share this medicine with others. What if I miss a dose? It is important not to miss your dose. Call your doctor or health care professional if you are unable to keep an appointment. What may interact with this medicine? This medicine may interact with the following medications: -other iron products This list may not describe all possible interactions. Give your health care provider a list of all the medicines, herbs, non-prescription drugs, or dietary supplements you use. Also tell them if you smoke, drink alcohol, or use illegal drugs. Some items may interact with your  medicine. What should I watch for while using this medicine? Visit your doctor or healthcare professional regularly. Tell your doctor or healthcare professional if your symptoms do not start to get better or if they get worse. You may need blood work done while you are taking this medicine. You may need to follow a special diet. Talk to your doctor. Foods that contain iron include: whole grains/cereals, dried fruits, beans, or peas, leafy green vegetables, and organ meats (liver, kidney). What side effects may I notice from receiving this medicine? Side effects that you should report to your doctor or health care professional as soon as possible: -allergic reactions like skin rash, itching or hives, swelling of the face, lips, or tongue -breathing problems -changes in blood pressure -feeling faint or lightheaded, falls -fever or chills -flushing, sweating, or hot feelings -swelling of the ankles or feet Side effects that usually do not require medical attention (Report these to your doctor or health care professional if they continue or are bothersome.): -diarrhea -headache -nausea, vomiting -stomach pain This list may not describe all possible side effects. Call your doctor for medical advice about side effects. You may report side effects to FDA at 1-800-FDA-1088. Where should I keep my medicine? This drug is given in a hospital or clinic and will not be stored at home. NOTE: This sheet is a summary. It may not cover all possible information. If you have questions about this medicine, talk to your doctor, pharmacist, or health care provider.  2012, Elsevier/Gold Standard. (08/22/2008 9:48:25 PM) 

## 2012-12-02 ENCOUNTER — Other Ambulatory Visit (HOSPITAL_COMMUNITY): Payer: Self-pay | Admitting: Surgery

## 2012-12-02 NOTE — Patient Instructions (Addendum)
21 Hannah Hale  12/02/2012   Your procedure is scheduled on: 12-08-2012  Report to [facility] at 0630 AM.  Call this number if you have problems the morning of surgery [phone]   Remember:   Do not eat food or drink liquids :After Midnight.     Take these medicines the morning of surgery with A SIP OF WATER: norco if needed   Do not wear jewelry, make-up or nail polish.  Do not wear lotions, powders, or perfumes. You may wear deodorant.  Do not shave 48 hours prior to surgery. Men may shave face and neck.  Do not bring valuables to the hospital.  Contacts, dentures or bridgework may not be worn into surgery.  Leave suitcase in the car. After surgery it may be brought to your room.  For patients admitted to the hospital, checkout time is 11:00 AM the day of discharge.   Patients discharged the day of surgery will not be allowed to drive home.  Name and phone number of your driver: [name] [phone]    Special Instructions: N/A   Please read over the following fact sheets that you were given: MRSA Information.  Call Birdie Sons RN pre op nurse if needed [phone]    FAILURE TO FOLLOW THESE INSTRUCTIONS MAY RESULT IN THE CANCELLATION OF YOUR SURGERY. PATIENT SIGNATURE___________________________________________

## 2012-12-03 ENCOUNTER — Telehealth: Payer: Self-pay | Admitting: *Deleted

## 2012-12-03 NOTE — Telephone Encounter (Signed)
Left message for pt to return my call so I can speak with her concerning her upcoming appt.

## 2012-12-05 ENCOUNTER — Encounter (HOSPITAL_COMMUNITY): Payer: Self-pay

## 2012-12-05 ENCOUNTER — Encounter (HOSPITAL_COMMUNITY)
Admission: RE | Admit: 2012-12-05 | Discharge: 2012-12-05 | Disposition: A | Discharge status: Home / Self Care | Payer: 59 | Source: Ambulatory Visit | Attending: Surgery | Admitting: Surgery

## 2012-12-05 LAB — CBC
HCT: 32.8 % — ABNORMAL LOW (ref 36.0–46.0)
MCH: 23 pg — ABNORMAL LOW (ref 26.0–34.0)
MCHC: 31.1 g/dL (ref 30.0–36.0)
MCV: 74 fL — ABNORMAL LOW (ref 78.0–100.0)
Platelets: 327 10*3/uL (ref 150–400)
RDW: 21.3 % — ABNORMAL HIGH (ref 11.5–15.5)
WBC: 11.5 10*3/uL — ABNORMAL HIGH (ref 4.0–10.5)

## 2012-12-05 NOTE — Progress Notes (Signed)
Chest x-ray 10/26/12 on [facility]

## 2012-12-06 ENCOUNTER — Other Ambulatory Visit: Payer: Self-pay | Admitting: *Deleted

## 2012-12-06 DIAGNOSIS — C50419 Malignant neoplasm of upper-outer quadrant of unspecified female breast: Secondary | ICD-10-CM

## 2012-12-08 ENCOUNTER — Telehealth (INDEPENDENT_AMBULATORY_CARE_PROVIDER_SITE_OTHER): Payer: Self-pay

## 2012-12-08 ENCOUNTER — Telehealth (INDEPENDENT_AMBULATORY_CARE_PROVIDER_SITE_OTHER): Payer: Self-pay | Admitting: General Surgery

## 2012-12-08 ENCOUNTER — Encounter (HOSPITAL_COMMUNITY)
Admission: RE | Disposition: A | Discharge status: Home / Self Care | Payer: Self-pay | Source: Ambulatory Visit | Attending: Surgery

## 2012-12-08 ENCOUNTER — Ambulatory Visit (HOSPITAL_COMMUNITY): Payer: 59

## 2012-12-08 ENCOUNTER — Encounter (HOSPITAL_COMMUNITY): Payer: Self-pay | Admitting: Anesthesiology

## 2012-12-08 ENCOUNTER — Ambulatory Visit (HOSPITAL_COMMUNITY): Payer: 59 | Admitting: Anesthesiology

## 2012-12-08 ENCOUNTER — Ambulatory Visit (HOSPITAL_COMMUNITY)
Admission: RE | Admit: 2012-12-08 | Discharge: 2012-12-08 | Disposition: A | Discharge status: Home / Self Care | Payer: 59 | Source: Ambulatory Visit | Attending: Surgery | Admitting: Surgery

## 2012-12-08 ENCOUNTER — Encounter (HOSPITAL_COMMUNITY): Payer: Self-pay | Admitting: *Deleted

## 2012-12-08 DIAGNOSIS — D649 Anemia, unspecified: Secondary | ICD-10-CM | POA: Insufficient documentation

## 2012-12-08 DIAGNOSIS — Z79899 Other long term (current) drug therapy: Secondary | ICD-10-CM | POA: Insufficient documentation

## 2012-12-08 DIAGNOSIS — Z01812 Encounter for preprocedural laboratory examination: Secondary | ICD-10-CM | POA: Insufficient documentation

## 2012-12-08 DIAGNOSIS — R059 Cough, unspecified: Secondary | ICD-10-CM | POA: Insufficient documentation

## 2012-12-08 DIAGNOSIS — C50419 Malignant neoplasm of upper-outer quadrant of unspecified female breast: Secondary | ICD-10-CM

## 2012-12-08 DIAGNOSIS — Z9010 Acquired absence of unspecified breast and nipple: Secondary | ICD-10-CM | POA: Insufficient documentation

## 2012-12-08 DIAGNOSIS — R05 Cough: Secondary | ICD-10-CM | POA: Insufficient documentation

## 2012-12-08 DIAGNOSIS — C50919 Malignant neoplasm of unspecified site of unspecified female breast: Secondary | ICD-10-CM

## 2012-12-08 DIAGNOSIS — F17200 Nicotine dependence, unspecified, uncomplicated: Secondary | ICD-10-CM | POA: Insufficient documentation

## 2012-12-08 DIAGNOSIS — C773 Secondary and unspecified malignant neoplasm of axilla and upper limb lymph nodes: Secondary | ICD-10-CM | POA: Insufficient documentation

## 2012-12-08 HISTORY — PX: AXILLARY LYMPH NODE DISSECTION: SHX5229

## 2012-12-08 HISTORY — PX: PORTACATH PLACEMENT: SHX2246

## 2012-12-08 SURGERY — INSERTION, TUNNELED CENTRAL VENOUS DEVICE, WITH PORT
Anesthesia: General | Site: Chest | Wound class: Clean

## 2012-12-08 MED ORDER — PROMETHAZINE HCL 25 MG/ML IJ SOLN: 6.2500 mg | INTRAMUSCULAR | Status: DC | PRN

## 2012-12-08 MED ORDER — BUPIVACAINE HCL (PF) 0.25 % IJ SOLN
INTRAMUSCULAR | Status: AC
  Filled 2012-12-08: qty 60

## 2012-12-08 MED ORDER — DEXAMETHASONE SODIUM PHOSPHATE 10 MG/ML IJ SOLN
INTRAMUSCULAR | Status: DC | PRN
  Administered 2012-12-08: 10 mg via INTRAVENOUS

## 2012-12-08 MED ORDER — LACTATED RINGERS IV SOLN
INTRAVENOUS | Status: DC | PRN
  Administered 2012-12-08 (×2): via INTRAVENOUS

## 2012-12-08 MED ORDER — HEPARIN SODIUM (PORCINE) 5000 UNIT/ML IJ SOLN
Freq: Once | INTRAMUSCULAR | Status: DC
Start: 2012-12-08 — End: 2012-12-08
  Filled 2012-12-08: qty 1.2

## 2012-12-08 MED ORDER — LABETALOL HCL 5 MG/ML IV SOLN
INTRAVENOUS | Status: DC | PRN
  Administered 2012-12-08: 5 mg via INTRAVENOUS

## 2012-12-08 MED ORDER — PROPOFOL 10 MG/ML IV BOLUS
INTRAVENOUS | Status: DC | PRN
  Administered 2012-12-08: 170 mg via INTRAVENOUS

## 2012-12-08 MED ORDER — SODIUM CHLORIDE 0.9 % IR SOLN
Status: DC | PRN
  Administered 2012-12-08: 11:00:00

## 2012-12-08 MED ORDER — HEPARIN SOD (PORK) LOCK FLUSH 100 UNIT/ML IV SOLN
INTRAVENOUS | Status: DC | PRN
  Administered 2012-12-08: 500 [IU] via INTRAVENOUS

## 2012-12-08 MED ORDER — KETOROLAC TROMETHAMINE 30 MG/ML IJ SOLN: 15.0000 mg | Freq: Once | INTRAMUSCULAR | Status: DC | PRN

## 2012-12-08 MED ORDER — MIDAZOLAM HCL 5 MG/5ML IJ SOLN
INTRAMUSCULAR | Status: DC | PRN
  Administered 2012-12-08: 2 mg via INTRAVENOUS

## 2012-12-08 MED ORDER — FENTANYL CITRATE 0.05 MG/ML IJ SOLN
INTRAMUSCULAR | Status: AC
  Filled 2012-12-08: qty 2

## 2012-12-08 MED ORDER — ACETAMINOPHEN 10 MG/ML IV SOLN
INTRAVENOUS | Status: AC
  Filled 2012-12-08: qty 100

## 2012-12-08 MED ORDER — KETOROLAC TROMETHAMINE 30 MG/ML IJ SOLN
30.0000 mg | Freq: Once | INTRAMUSCULAR | Status: AC | PRN
  Administered 2012-12-08: 30 mg via INTRAVENOUS
  Filled 2012-12-08: qty 1

## 2012-12-08 MED ORDER — HYDROCODONE-ACETAMINOPHEN 5-325 MG PO TABS: 1.0000 | ORAL_TABLET | Freq: Four times a day (QID) | ORAL | Status: DC | PRN

## 2012-12-08 MED ORDER — ONDANSETRON HCL 4 MG/2ML IJ SOLN
INTRAMUSCULAR | Status: DC | PRN
  Administered 2012-12-08: 4 mg via INTRAVENOUS

## 2012-12-08 MED ORDER — LIDOCAINE HCL (CARDIAC) 20 MG/ML IV SOLN
INTRAVENOUS | Status: DC | PRN
  Administered 2012-12-08: 50 mg via INTRAVENOUS

## 2012-12-08 MED ORDER — HYDROCODONE-ACETAMINOPHEN 5-325 MG PO TABS
1.0000 | ORAL_TABLET | ORAL | Status: DC | PRN
  Administered 2012-12-08: 1 via ORAL
  Filled 2012-12-08: qty 1

## 2012-12-08 MED ORDER — CEFAZOLIN SODIUM-DEXTROSE 2-3 GM-% IV SOLR
2.0000 g | INTRAVENOUS | Status: AC
  Administered 2012-12-08: 2 g via INTRAVENOUS

## 2012-12-08 MED ORDER — ACETAMINOPHEN 10 MG/ML IV SOLN
INTRAVENOUS | Status: DC | PRN
  Administered 2012-12-08: 1000 mg via INTRAVENOUS

## 2012-12-08 MED ORDER — SODIUM CHLORIDE 0.9 % IR SOLN
Status: DC | PRN
  Administered 2012-12-08: 1000 mL

## 2012-12-08 MED ORDER — FENTANYL CITRATE 0.05 MG/ML IJ SOLN
INTRAMUSCULAR | Status: DC | PRN
  Administered 2012-12-08: 100 ug via INTRAVENOUS
  Administered 2012-12-08 (×3): 50 ug via INTRAVENOUS

## 2012-12-08 MED ORDER — CEFAZOLIN SODIUM-DEXTROSE 2-3 GM-% IV SOLR
INTRAVENOUS | Status: AC
  Filled 2012-12-08: qty 50

## 2012-12-08 MED ORDER — SUCCINYLCHOLINE CHLORIDE 20 MG/ML IJ SOLN
INTRAMUSCULAR | Status: DC | PRN
  Administered 2012-12-08: 100 mg via INTRAVENOUS

## 2012-12-08 MED ORDER — FENTANYL CITRATE 0.05 MG/ML IJ SOLN
25.0000 ug | INTRAMUSCULAR | Status: DC | PRN
  Administered 2012-12-08 (×3): 50 ug via INTRAVENOUS

## 2012-12-08 MED ORDER — HEPARIN SOD (PORK) LOCK FLUSH 100 UNIT/ML IV SOLN
INTRAVENOUS | Status: AC
  Filled 2012-12-08: qty 10

## 2012-12-08 SURGICAL SUPPLY — 62 items
ADH SKN CLS APL DERMABOND .7 (GAUZE/BANDAGES/DRESSINGS) ×2
APL SKNCLS STERI-STRIP NONHPOA (GAUZE/BANDAGES/DRESSINGS)
APPLIER CLIP 11 MED OPEN (CLIP) ×3
APR CLP MED 11 20 MLT OPN (CLIP) ×2
BAG DECANTER FOR FLEXI CONT (MISCELLANEOUS) ×3 IMPLANT
BANDAGE ELASTIC 6 VELCRO ST LF (GAUZE/BANDAGES/DRESSINGS) ×2 IMPLANT
BENZOIN TINCTURE PRP APPL 2/3 (GAUZE/BANDAGES/DRESSINGS) ×2 IMPLANT
BLADE SURG 15 STRL LF DISP TIS (BLADE) ×6 IMPLANT
BLADE SURG 15 STRL SS (BLADE) ×9
BLADE SURG SZ10 CARB STEEL (BLADE) ×3 IMPLANT
CANISTER SUCTION 2500CC (MISCELLANEOUS) ×3 IMPLANT
CHLORAPREP W/TINT 10.5 ML (MISCELLANEOUS) ×4 IMPLANT
CLEANER TIP ELECTROSURG 2X2 (MISCELLANEOUS) ×2 IMPLANT
CLIP APPLIE 11 MED OPEN (CLIP) IMPLANT
CLOSURE STERI-STRIP 1/4X4 (GAUZE/BANDAGES/DRESSINGS) ×1 IMPLANT
CLOTH BEACON ORANGE TIMEOUT ST (SAFETY) ×3 IMPLANT
DECANTER SPIKE VIAL GLASS SM (MISCELLANEOUS) ×2 IMPLANT
DERMABOND ADVANCED (GAUZE/BANDAGES/DRESSINGS) ×1
DERMABOND ADVANCED .7 DNX12 (GAUZE/BANDAGES/DRESSINGS) IMPLANT
DRAIN CHANNEL 19F RND (DRAIN) ×1 IMPLANT
DRAPE C-ARM 42X72 X-RAY (DRAPES) ×3 IMPLANT
DRAPE LAPAROTOMY TRNSV 102X78 (DRAPE) ×3 IMPLANT
ELECT COATED BLADE 2.86 ST (ELECTRODE) ×3 IMPLANT
ELECT REM PT RETURN 9FT ADLT (ELECTROSURGICAL) ×3
ELECTRODE REM PT RTRN 9FT ADLT (ELECTROSURGICAL) ×2 IMPLANT
EVACUATOR DRAINAGE 10X20 100CC (DRAIN) IMPLANT
EVACUATOR SILICONE 100CC (DRAIN) ×3
GAUZE SPONGE 2X2 8PLY STRL LF (GAUZE/BANDAGES/DRESSINGS) ×2 IMPLANT
GAUZE SPONGE 4X4 16PLY XRAY LF (GAUZE/BANDAGES/DRESSINGS) ×2 IMPLANT
GLOVE BIOGEL PI IND STRL 7.0 (GLOVE) ×2 IMPLANT
GLOVE BIOGEL PI INDICATOR 7.0 (GLOVE) ×1
GLOVE SURG SIGNA 7.5 PF LTX (GLOVE) ×3 IMPLANT
GOWN STRL NON-REIN LRG LVL3 (GOWN DISPOSABLE) ×2 IMPLANT
GOWN STRL REIN XL XLG (GOWN DISPOSABLE) ×8 IMPLANT
IV CATHETER PAT DEVICE CLC2000 (IV SETS) IMPLANT
KIT BASIN OR (CUSTOM PROCEDURE TRAY) ×3 IMPLANT
KIT POWER CATH 8FR (Catheter) ×1 IMPLANT
MARKER SKIN DUAL TIP RULER LAB (MISCELLANEOUS) ×2 IMPLANT
NDL HYPO 25X1 1.5 SAFETY (NEEDLE) ×2 IMPLANT
NEEDLE HYPO 22GX1.5 SAFETY (NEEDLE) ×2 IMPLANT
NEEDLE HYPO 25X1 1.5 SAFETY (NEEDLE) IMPLANT
NS IRRIG 1000ML POUR BTL (IV SOLUTION) ×3 IMPLANT
PACK BASIC VI WITH GOWN DISP (CUSTOM PROCEDURE TRAY) ×3 IMPLANT
PENCIL BUTTON HOLSTER BLD 10FT (ELECTRODE) ×3 IMPLANT
SOL PREP POV-IOD 16OZ 10% (MISCELLANEOUS) ×2 IMPLANT
SPONGE GAUZE 2X2 STER 10/PKG (GAUZE/BANDAGES/DRESSINGS)
SPONGE GAUZE 4X4 12PLY (GAUZE/BANDAGES/DRESSINGS) ×5 IMPLANT
SPONGE LAP 4X18 X RAY DECT (DISPOSABLE) ×3 IMPLANT
STRIP CLOSURE SKIN 1/4X3 (GAUZE/BANDAGES/DRESSINGS) ×4 IMPLANT
STRIP CLOSURE SKIN 1/4X4 (GAUZE/BANDAGES/DRESSINGS) ×2 IMPLANT
SUT ETHILON 2 0 PS N (SUTURE) ×1 IMPLANT
SUT VIC AB 3-0 SH 18 (SUTURE) ×4 IMPLANT
SUT VIC AB 5-0 P-3 18XBRD (SUTURE) ×2 IMPLANT
SUT VIC AB 5-0 P3 18 (SUTURE) ×3
SUT VIC AB 5-0 PS2 18 (SUTURE) ×3 IMPLANT
SYR BULB IRRIGATION 50ML (SYRINGE) ×2 IMPLANT
SYR CONTROL 10ML LL (SYRINGE) ×2 IMPLANT
SYRINGE 10CC LL (SYRINGE) ×2 IMPLANT
TAPE CLOTH SURG 4X10 WHT LF (GAUZE/BANDAGES/DRESSINGS) ×2 IMPLANT
TOWEL OR 17X26 10 PK STRL BLUE (TOWEL DISPOSABLE) ×3 IMPLANT
WATER STERILE IRR 1500ML POUR (IV SOLUTION) ×2 IMPLANT
YANKAUER SUCT BULB TIP 10FT TU (MISCELLANEOUS) ×2 IMPLANT

## 2012-12-08 NOTE — Op Note (Signed)
NAMEHARINI, DEARMOND            ACCOUNT NO.:  [id number]  MEDICAL RECORD NO.:  [id number]  LOCATION:  [facility]                         FACILITY:  [facility]  PHYSICIAN:  Sandria Bales. Ezzard Standing, M.D.  DATE OF BIRTH:  October 04, 1966  DATE OF PROCEDURE:  12/08/2012                              OPERATIVE REPORT   PREOPERATIVE DIAGNOSIS:  Left breast cancer, T3, N1, anticipates chemotherapy  POSTOPERATIVE DIAGNOSIS:  Left breast cancer, T3, N1, anticipates chemotherapy.  PROCEDURE:  Completion of left axillary lymph node dissection and right subclavian PowerPort placement.  SURGEON:  Sandria Bales. Ezzard Standing, M.D.  FIRST ASSISTANT:  None.  ANESTHESIA:  General endotracheal.  ESTIMATED BLOOD LOSS:  Less than 100 mL.  DRAINS:  Left in was a 19-Blake drain in the left axilla.  ANESTHESIA:  General endotracheal with Dr. Eilene Ghazi, supervising anesthesiologist.  INDICATION FOR PROCEDURE:  Ms. Fukuda is a 46 year old African American female who underwent a left mastectomy on November 03, 2012.  Unfortunately, her final pathology showed invasive ductal carcinoma spanning 15 cm with one of two nodes positive.  Discussed her case with Dr. Donnie Coffin who agrees that she would be best served with completion of axillary node dissection and placement of Port-A-Cath for anticipated chemotherapy.  Potential complications of both axillary node dissection and Port-A-Cath placement were explained to the patient.  Potential complications include, but not limited to, bleeding, infection, nerve injury, pneumothorax, and the need for possible further surgery.  OPERATIVE NOTE:  The patient was placed in the supine position.  I first did the left axillary node dissection.  Her left chest/axilla was prepped with ChloraPrep and sterilely draped.    The time-out was held and surgical checklist run.  I went through a separate left axillary incision higher than the mastectomy incision.  I dissected and found the axillary vein.  I took at  least two tributaries off of the vein below.  I identified the long thoracic nerve and the thoracodorsal nerve, and swept the breast contents inferiorly.  I could palpate 3 or 4 enlarged lymph nodes.  It was unclear to me what these were due to the recent mastectomy or involved with tumor.  I had tried to take out level two axillary nodes behind the pectoralis major muscle and swept the axillary contents below the axillary vein down to where I ran into the scar tissue from the mastectomy.  The wound the wound was then irrigated.  A 19-Blake drain was placed in the axilla, sewn in place with 2-0 nylon suture.  I then closed the wound in layers with a 3-0 Vicryl subcutaneous sutures and a 5-0 Vicryl subcuticular suture.  We then painted the wound with Dermabond.  The drain had been sewn in place with a 2-0 nylon suture, and this was then sterilely dressed.  I then turned my attention to placing a Port-A-Cath.  She was then repositioned with both arms tucked and a roll under her back, again she was still under general anesthesia and her upper chest and chin were prepped with ChloraPrep.  We did another time-out and checklist.  I accessed the right subclavian vein with a single stick using the 16- gauge needle and threaded a guidewire into this.  Position was checked with fluoroscopy.  I then developed a pocket at the upper inner aspect of her right breast and sewed in the PowerPort using 3-0 Vicryl sutures.  I passed the 8- [place] silastic tubing from the upper inner breast pocket to the subclavian site and then threaded that into the subclavian vein.  There was little bit a kink in the tubing or in the introducer, but by backing this out, I could advance the tubing into the superior vena cava and down into the right atrium.  I tried to position the tip of the catheter at the junction of the superior vena cava right atrium and then attached the silastic tubing to the reservoir with the Band-Aid  device.  I then flushed the entire unit with dilute heparin 10 units/mL.  I then used 100 units/mL, 5 mL to the end and flushed out the whole unit.  The whole position was checked with fluoroscopy, confirming a good position of the tubing without any evidence of kinks.  I then closed the wound with 3-0 Vicryl sutures, the skin with a 5-0 Vicryl suture, painted the wounds with tincture of benzoin and Steri- Stripped.  The patient tolerated the procedure well, was transported to the recovery room in good condition.  Sponge and needle count were correct at the end of the case.  Chest x-ray is pending at the time of this dictation.  This was done as an outpatient and she will see me in about a week for wound/drain check.   Sandria Bales. Ezzard Standing, M.D., FACS   DHN/MEDQ  D:  12/08/2012  T:  12/08/2012  Job:  [id number]  cc:   Lurline Hare, M.D. Fax: [phone]  Pierce Crane, M.D., F.R.C.P.C. Fax: [phone]  Tylene Fantasia. Morris, D.O.

## 2012-12-08 NOTE — Progress Notes (Signed)
Portable Chest X-ray results called to Dr. Ezzard Standing.

## 2012-12-08 NOTE — Anesthesia Preprocedure Evaluation (Addendum)
Anesthesia Evaluation  Patient identified by MRN, date of birth, ID band Patient awake    Reviewed: Allergy & Precautions, H&P , NPO status , Patient's Chart, lab work & pertinent test results  Airway Mallampati: II TM Distance: >3 FB Neck ROM: Full    Dental No notable dental hx.    Pulmonary Current Smoker,  breath sounds clear to auscultation  Pulmonary exam normal       Cardiovascular negative cardio ROS  Rhythm:Regular Rate:Normal     Neuro/Psych negative neurological ROS  negative psych ROS   GI/Hepatic negative GI ROS, Neg liver ROS,   Endo/Other  Morbid obesity  Renal/GU negative Renal ROS  negative genitourinary   Musculoskeletal negative musculoskeletal ROS (+)   Abdominal   Peds negative pediatric ROS (+)  Hematology negative hematology ROS (+)   Anesthesia Other Findings   Reproductive/Obstetrics negative OB ROS                          Anesthesia Physical Anesthesia Plan  ASA: II  Anesthesia Plan: General   Post-op Pain Management:    Induction: Intravenous  Airway Management Planned: Oral ETT and LMA  Additional Equipment:   Intra-op Plan:   Post-operative Plan: Extubation in OR  Informed Consent: I have reviewed the patients History and Physical, chart, labs and discussed the procedure including the risks, benefits and alternatives for the proposed anesthesia with the patient or authorized representative who has indicated his/her understanding and acceptance.   Dental advisory given  Plan Discussed with: CRNA and Surgeon  Anesthesia Plan Comments:        Anesthesia Quick Evaluation

## 2012-12-08 NOTE — Transfer of Care (Signed)
Immediate Anesthesia Transfer of Care Note  Patient: Hannah Hale  Procedure(s) Performed: Procedure(s) (LRB) with comments: INSERTION PORT-A-CATH (N/A) - Power Port Placment and Left Axillary Node Dissection AXILLARY LYMPH NODE DISSECTION (Left)  Patient Location: PACU  Anesthesia Type:General  Level of Consciousness: alert , oriented and sedated  Airway & Oxygen Therapy: Patient Spontanous Breathing and Patient connected to face mask oxygen  Post-op Assessment: Report given to PACU RN, Post -op Vital signs reviewed and stable and Patient moving all extremities X 4  Post vital signs: stable  Complications: No apparent anesthesia complications

## 2012-12-08 NOTE — H&P (View-Only) (Signed)
Re:   Hannah Hale DOB:   03-15-1966 MRN:   [id number]  [facility]  ASSESSMENT AND PLAN: 1.  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  LVI identified.  1/2 lymph nodes involved.  T3, N1., ER - neg, PR - neg., Ki67 - 30% (Stage IIIA)  She is set to see Dr. Donnie Hale back on 11/17/2012.  She has no appt with Dr. Michell Hale.   1a.  I discussed with her about a power port, anticipating Dr. Renelda Hale discussion with her about chemotx.  And I talked about possible left axillary completion lymph node dissection.  The risks include, but are not limited to, bleeding, infection, thrombosis, nerve injury, and pneumothorax.  I will be in touch with her this Friday, 12/6, after she has seen Dr. Donnie Hale.  [Discussed with Drs. Hannah Hale/Hannah Hale - plan metastatic work up --> then port and possible completion axillary dissection (depending on met work up)  DN  11/15/12]  2.  Smokes cigarettes - 1/2 pack per day  Going to try to quit. 3.  Chronic anemia.  REFERRING PHYSICIAN: No primary provider on file.  HISTORY OF PRESENT ILLNESS: Hannah Hale is a 46 y.o. (DOB: Jul 28, 1966)  AA female whose primary care physician is No primary provider on file. and comes to the [facility] for left breast cancer.  Her husband and her sister, Hannah Hale (from [place]), are with her.  She sees Dr. Mitchel Hale at [facility].  She comes today for followup of her left mastectomy.  I spent a good amount of time reviewing her path and its implications.  Her drain for both drains is <20cc.  History of left breast cancer: Ms. Kuennen felt a lumpiness in her left breast recently.  This prompted a mammogram.  She has had no prior breast problems or surgery.  This was her first mammogram. She still is having regular periods. LMP was 3 weeks.  Has no children.  No family history of breast cancer. MRI - 09/08/2012.  The abnormal area in total measures  17.6(AP) x 8.8 (trv) x 9.4 (CC) cm.   Past Medical History  Diagnosis Date  . Breast cancer   . Coughing     cold recent      Current Outpatient Prescriptions  Medication Sig Dispense Refill  . diphenhydrAMINE (SOMINEX) 25 MG tablet Take 50 mg by mouth 2 (two) times daily as needed. For itching      . guaiFENesin (ROBITUSSIN) 100 MG/5ML SOLN Take 5 mLs by mouth every 4 (four) hours as needed.      Marland Kitchen HYDROcodone-acetaminophen (NORCO/VICODIN) 5-325 MG per tablet Take 1 tablet by mouth every 6 (six) hours as needed.      . Pseudoeph-Doxylamine-DM-APAP (NYQUIL PO) Take by mouth.         No Known Allergies  REVIEW OF SYSTEMS: Skin:  No history of rash.  No history of abnormal moles. Infection:  No history of hepatitis or HIV.  No history of MRSA. Neurologic:  No history of stroke.  No history of seizure.  No history of headaches. Cardiac:  No history of hypertension. No history of heart disease.  No history of prior cardiac catheterization.  No history of seeing a cardiologist. Pulmonary:  Smokes 1/2 ppd. She knows this is bad for her health.  Endocrine:  No diabetes. No thyroid disease. Gastrointestinal:  No history of stomach disease.  No history  of liver disease.  No history of gall bladder disease.  No history of pancreas disease.  No history of colon disease. Urologic:  No history of kidney stones.  No history of bladder infections. Musculoskeletal:  No history of joint or back disease. Hematologic:  No bleeding disorder.  No history of anemia.  Not anticoagulated. Psycho-social:  The patient is oriented.   The patient has no obvious psychologic or social impairment to understanding our conversation and plan.  SOCIAL and FAMILY HISTORY: Married.  Husband is with her. She works at [employer] as a Scientist, clinical (histocompatibility and immunogenetics). Her sister, Hannah Hale, is with her.  PHYSICAL EXAM: BP 150/94  Pulse 78  Temp 97.4 F (36.3 C)  Resp 16  Ht 5\' 4"  (1.626 m)  Wt 208 lb (94.348 kg)  BMI 35.70 kg/m2  LMP  10/18/2012   General: AA F who is alert and generally healthy appearing.  Breasts:  Left:  Absent.  Wound looks good.  Staples removed and drains removed.  Right:  She has acne in the LIQ of both breasts.  DATA REVIEWED: Path report to patient.  Hannah Kin, MD,  [facility] [address] [place]  [address]   [place], [place]    [address] Phone:  [phone] FAX:  [phone]

## 2012-12-08 NOTE — Telephone Encounter (Signed)
Pt called to report that pain medicine had not yet been called in for her PAC insertion procedure today.  Verified orders in chart and phoned Hydrocodone 5/325 mg, # 3, 1-2 po Q6H prn pain, 1 RF to [facility]:  [phone].  Pt aware.

## 2012-12-08 NOTE — Interval H&P Note (Signed)
History and Physical Interval Note:  12/08/2012 8:22 AM  Hannah Hale  has presented today for surgery, with the diagnosis of left breast cancer  The various methods of treatment have been discussed with the patient and family. Her husband is here today.  After consideration of risks, benefits and other options for treatment, the patient has consented to  Procedure(s) (LRB) with comments: INSERTION PORT-A-CATH (N/A) - Power [employer] and Left Axillary Node Dissection AXILLARY LYMPH NODE DISSECTION (Left) as a surgical intervention .    The patient's history has been reviewed, patient examined, no change in status, stable for surgery.  I have reviewed the patient's chart and labs.  Questions were answered to the patient's satisfaction.     Myles Tavella H

## 2012-12-08 NOTE — Progress Notes (Signed)
Portable Chest X/Ray done

## 2012-12-08 NOTE — Telephone Encounter (Signed)
Post op appt moved up to 12/16/12 @ 4:30 pt aware

## 2012-12-08 NOTE — Anesthesia Postprocedure Evaluation (Signed)
  Anesthesia Post-op Note  Patient: Hannah Hale  Procedure(s) Performed: Procedure(s) (LRB): INSERTION PORT-A-CATH (N/A) AXILLARY LYMPH NODE DISSECTION (Left)  Patient Location: PACU  Anesthesia Type: General  Level of Consciousness: awake and alert   Airway and Oxygen Therapy: Patient Spontanous Breathing  Post-op Pain: mild  Post-op Assessment: Post-op Vital signs reviewed, Patient's Cardiovascular Status Stable, Respiratory Function Stable, Patent Airway and No signs of Nausea or vomiting  Last Vitals:  Filed Vitals:   12/08/12 1130  BP: 170/81  Pulse: 77  Temp:   Resp: 14    Post-op Vital Signs: stable   Complications: No apparent anesthesia complications

## 2012-12-09 ENCOUNTER — Encounter (HOSPITAL_COMMUNITY): Payer: Self-pay | Admitting: Surgery

## 2012-12-12 ENCOUNTER — Telehealth: Payer: Self-pay

## 2012-12-12 ENCOUNTER — Telehealth: Payer: Self-pay | Admitting: *Deleted

## 2012-12-12 NOTE — Telephone Encounter (Signed)
Patient confirmed over the phone the new date and time 12-20-2012 starting at 2:30pm

## 2012-12-12 NOTE — Telephone Encounter (Signed)
Patient calling to see if we have disability papers faxed from [employer]. Dr. Perrin Maltese should be filling out paperwork. Patient has not paid $15 fee, but is aware that it must be paid prior to papers being filled out.

## 2012-12-15 ENCOUNTER — Ambulatory Visit: Payer: 59 | Admitting: Oncology

## 2012-12-15 ENCOUNTER — Other Ambulatory Visit: Payer: 59 | Admitting: Lab

## 2012-12-16 ENCOUNTER — Ambulatory Visit (INDEPENDENT_AMBULATORY_CARE_PROVIDER_SITE_OTHER): Payer: 59 | Admitting: Surgery

## 2012-12-16 ENCOUNTER — Ambulatory Visit: Payer: 59 | Admitting: Oncology

## 2012-12-16 ENCOUNTER — Other Ambulatory Visit: Payer: 59 | Admitting: Lab

## 2012-12-16 ENCOUNTER — Encounter (INDEPENDENT_AMBULATORY_CARE_PROVIDER_SITE_OTHER): Payer: Self-pay | Admitting: Surgery

## 2012-12-16 VITALS — BP 128/81 | HR 77 | Temp 98.6°F | Resp 16 | Ht 64.0 in | Wt 206.2 lb

## 2012-12-16 DIAGNOSIS — C50419 Malignant neoplasm of upper-outer quadrant of unspecified female breast: Secondary | ICD-10-CM

## 2012-12-16 NOTE — Progress Notes (Signed)
Re:   Hannah Hale DOB:   Apr 26, 1966 MRN:   [id number]  [facility]  ASSESSMENT AND PLAN: 1.  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  LVI identified.  1/2 lymph nodes involved.  T3, N1., ER - neg, PR - neg., Ki67 - 30% (Stage IIIA)  Sees Dr. Donnie Coffin Khan/Dr. Michell Heinrich.  Left completion axillary node dissection/right subclavian power port - 12/08/2012  Final path - 0/10 nodes (so final total was 1/12 nodes)  To see Dr. Welton Flakes next week.   Drain not ready to come out - to see me next week to recheck wound  2.  Smokes cigarettes - 1/2 pack per day  Going to try to quit. 3.  Chronic anemia.  REFERRING PHYSICIAN: No primary provider on file.  HISTORY OF PRESENT ILLNESS: Hannah Hale is a 47 y.o. (DOB: 10-12-1966)  AA female whose primary care physician is No primary provider on file. and through the [facility] for left breast cancer.  She sees Dr. Mitchel Honour at [facility].  She comes today for followup of her left axillary dissection and right subclavian power port placement.  Her drain is still draining about 60 to 70 cc/day.  I will leave it in.  History of left breast cancer: Ms. Nuncio felt a lumpiness in her left breast recently.  This prompted a mammogram.  She has had no prior breast problems or surgery.  This was her first mammogram. She still is having regular periods. LMP was 3 weeks.  Has no children.  No family history of breast cancer. MRI - 09/08/2012.  The abnormal area in total measures 17.6(AP) x 8.8 (trv) x 9.4 (CC) cm.   Past Medical History  Diagnosis Date  . Coughing     cold recent   . Breast cancer     left, Sept 20 2013     Current Outpatient Prescriptions  Medication Sig Dispense Refill  . diphenhydrAMINE (SOMINEX) 25 MG tablet Take 50 mg by mouth 2 (two) times daily as needed. For itching      . HYDROcodone-acetaminophen (NORCO/VICODIN) 5-325 MG per tablet Take 1 tablet by mouth every 6 (six)  hours as needed. Pain      . HYDROcodone-acetaminophen (NORCO/VICODIN) 5-325 MG per tablet Take 1-2 tablets by mouth every 6 (six) hours as needed for pain.  30 tablet  1    No Known Allergies  REVIEW OF SYSTEMS: Skin:  No history of rash.  No history of abnormal moles. Infection:  No history of hepatitis or HIV.  No history of MRSA. Neurologic:  No history of stroke.  No history of seizure.  No history of headaches. Cardiac:  No history of hypertension. No history of heart disease.  No history of prior cardiac catheterization.  No history of seeing a cardiologist. Pulmonary:  Smokes 1/2 ppd. She knows this is bad for her health.  Endocrine:  No diabetes. No thyroid disease. Gastrointestinal:  No history of stomach disease.  No history of liver disease.  No history of gall bladder disease.  No history of pancreas disease.  No history of colon disease. Urologic:  No history of kidney stones.  No history of bladder infections. Musculoskeletal:  No history of joint or back disease. Hematologic:  No bleeding disorder.  No history of anemia.  Not anticoagulated. Psycho-social:  The patient is oriented.   The patient has no obvious psychologic or social impairment to understanding our conversation and plan.  SOCIAL and FAMILY HISTORY: Married.   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  PHYSICAL EXAM: BP 128/81  Pulse 77  Temp 98.6 F (37 C) (Temporal)  Resp 16  Ht 5\' 4"  (1.626 m)  Wt 206 lb 3.2 oz (93.532 kg)  BMI 35.39 kg/m2  LMP 11/28/2012   General: AA F who is alert and generally healthy appearing.  Breasts:  Left:  Absent.  Wound looks good.    Left axillary wound okay, drain with serous drainage.  Right:  She has acne in the LIQ of both breasts. Chest:  Right subclavian power port.  DATA REVIEWED: Path report to patient.  Hannah Kin, MD,  [facility] [address] [place]  [address]   [place], [place]     [address] Phone:  [phone] FAX:  [phone]

## 2012-12-17 ENCOUNTER — Encounter: Payer: Self-pay | Admitting: Oncology

## 2012-12-20 ENCOUNTER — Ambulatory Visit (HOSPITAL_BASED_OUTPATIENT_CLINIC_OR_DEPARTMENT_OTHER): Payer: 59 | Admitting: Oncology

## 2012-12-20 ENCOUNTER — Other Ambulatory Visit (HOSPITAL_BASED_OUTPATIENT_CLINIC_OR_DEPARTMENT_OTHER): Payer: 59 | Admitting: Lab

## 2012-12-20 ENCOUNTER — Encounter: Payer: Self-pay | Admitting: Oncology

## 2012-12-20 ENCOUNTER — Other Ambulatory Visit: Payer: Self-pay | Admitting: Emergency Medicine

## 2012-12-20 VITALS — BP 153/85 | HR 85 | Temp 97.7°F | Resp 20 | Ht 64.0 in | Wt 207.7 lb

## 2012-12-20 DIAGNOSIS — C50419 Malignant neoplasm of upper-outer quadrant of unspecified female breast: Secondary | ICD-10-CM

## 2012-12-20 DIAGNOSIS — Z171 Estrogen receptor negative status [ER-]: Secondary | ICD-10-CM

## 2012-12-20 DIAGNOSIS — Z9010 Acquired absence of unspecified breast and nipple: Secondary | ICD-10-CM

## 2012-12-20 LAB — CBC WITH DIFFERENTIAL/PLATELET
BASO%: 1.3 % (ref 0.0–2.0)
Basophils Absolute: 0.1 10*3/uL (ref 0.0–0.1)
EOS%: 1.8 % (ref 0.0–7.0)
HCT: 35.9 % (ref 34.8–46.6)
HGB: 11.4 g/dL — ABNORMAL LOW (ref 11.6–15.9)
LYMPH%: 24 % (ref 14.0–49.7)
MCH: 26.5 pg (ref 25.1–34.0)
MCHC: 31.8 g/dL (ref 31.5–36.0)
MONO#: 0.6 10*3/uL (ref 0.1–0.9)
NEUT%: 67.5 % (ref 38.4–76.8)
Platelets: 280 10*3/uL (ref 145–400)
lymph#: 2.7 10*3/uL (ref 0.9–3.3)

## 2012-12-20 LAB — COMPREHENSIVE METABOLIC PANEL (CC13)
BUN: 10 mg/dL (ref 7.0–26.0)
CO2: 23 mEq/L (ref 22–29)
Calcium: 9.3 mg/dL (ref 8.4–10.4)
Chloride: 112 mEq/L — ABNORMAL HIGH (ref 98–107)
Creatinine: 0.8 mg/dL (ref 0.6–1.1)
Total Bilirubin: <0.2 mg/dL (ref 0.20–1.20)

## 2012-12-20 MED ORDER — ONDANSETRON HCL 8 MG PO TABS: 8.0000 mg | ORAL_TABLET | Freq: Two times a day (BID) | ORAL | Status: DC | PRN

## 2012-12-20 MED ORDER — PROCHLORPERAZINE 25 MG RE SUPP: 25.0000 mg | Freq: Two times a day (BID) | RECTAL | Status: DC | PRN

## 2012-12-20 MED ORDER — DEXAMETHASONE 4 MG PO TABS: ORAL_TABLET | ORAL | Status: DC

## 2012-12-20 MED ORDER — PROCHLORPERAZINE MALEATE 10 MG PO TABS: 10.0000 mg | ORAL_TABLET | Freq: Four times a day (QID) | ORAL | Status: DC | PRN

## 2012-12-20 MED ORDER — LORAZEPAM 0.5 MG PO TABS: 0.5000 mg | ORAL_TABLET | Freq: Four times a day (QID) | ORAL | Status: DC | PRN

## 2012-12-20 MED ORDER — LIDOCAINE-PRILOCAINE 2.5-2.5 % EX CREA: TOPICAL_CREAM | CUTANEOUS | Status: DC | PRN

## 2012-12-20 NOTE — Patient Instructions (Addendum)
Proceed with chemotherapy on 1/21 FEC every 2 weeks x 4 cycles  We will see you back on 1/21 prior to chemotherapy

## 2012-12-21 ENCOUNTER — Telehealth: Payer: Self-pay | Admitting: *Deleted

## 2012-12-21 ENCOUNTER — Encounter (INDEPENDENT_AMBULATORY_CARE_PROVIDER_SITE_OTHER): Payer: 59 | Admitting: Surgery

## 2012-12-21 ENCOUNTER — Ambulatory Visit (INDEPENDENT_AMBULATORY_CARE_PROVIDER_SITE_OTHER): Payer: 59 | Admitting: Surgery

## 2012-12-21 ENCOUNTER — Telehealth: Payer: Self-pay | Admitting: Oncology

## 2012-12-21 ENCOUNTER — Encounter (INDEPENDENT_AMBULATORY_CARE_PROVIDER_SITE_OTHER): Payer: Self-pay | Admitting: Surgery

## 2012-12-21 VITALS — BP 132/74 | HR 82 | Temp 98.8°F | Resp 18 | Ht 64.0 in | Wt 205.0 lb

## 2012-12-21 DIAGNOSIS — C50419 Malignant neoplasm of upper-outer quadrant of unspecified female breast: Secondary | ICD-10-CM

## 2012-12-21 NOTE — Telephone Encounter (Signed)
S/W THE PT AND SHE IS AWARE OF HER ECHO APPT AT  CARDIOLOGY ON 12/28/2012 ALONG WITH THE MD VISIT IN JAN TO FOLLOW.

## 2012-12-21 NOTE — Progress Notes (Signed)
Re:   Hannah Hale DOB:   1966/07/09 MRN:   [id number]  [facility]  ASSESSMENT AND PLAN: 1.  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  LVI identified.  1/2 lymph nodes involved.  T3, N1., ER - neg, PR - neg., Ki67 - 30% (Stage IIIA)  Saw Khan (did see Dr. Rubin)/Dr. Michell Heinrich.  Left completion axillary node dissection/right subclavian power port - 12/08/2012  Final path - 0/10 nodes (so final total was 1/12 nodes)   Still with almost 50 cc /day.  Will leave drain again and see her next week.  2.  Smokes cigarettes - 1/2 pack per day  Going to try to quit. 3.  Chronic anemia.  REFERRING PHYSICIAN: Provider Not In System  HISTORY OF PRESENT ILLNESS: Hannah Hale is a 47 y.o. (DOB: [birth year])  AA female whose primary care physician is Provider Not In System and through the [facility] for left breast cancer.  She sees Dr. Mitchel Honour at [facility].  She comes today for followup of her left axillary dissection and drains in her left axilla.  Her drain is draining about 50 cc per day.  The rest of her incision is doing well.   But she needs to work on her shoulder movement.  History of left breast cancer: [name] felt a lumpiness in her left breast recently.  This prompted a mammogram.  She has had no prior breast problems or surgery.  This was her first mammogram. She still is having regular periods. LMP was 3 weeks.  Has no children.  No family history of breast cancer. MRI - 09/08/2012.  The abnormal area in total measures 17.6(AP) x 8.8 (trv) x 9.4 (CC) cm.   Past Medical History  Diagnosis Date  . Coughing     cold recent   . Breast cancer     left, Sept 20 2013     Current Outpatient Prescriptions  Medication Sig Dispense Refill  . dexamethasone (DECADRON) 4 MG tablet Take 2 tablets by mouth once a day on the day after chemotherapy and then take 2 tablets two times a day for 2 days. Take with food.  30 tablet  1    . diphenhydrAMINE (SOMINEX) 25 MG tablet Take 50 mg by mouth 2 (two) times daily as needed. For itching      . HYDROcodone-acetaminophen (NORCO/VICODIN) 5-325 MG per tablet Take 1 tablet by mouth every 6 (six) hours as needed. Pain      . HYDROcodone-acetaminophen (NORCO/VICODIN) 5-325 MG per tablet Take 1-2 tablets by mouth every 6 (six) hours as needed for pain.  30 tablet  1  . lidocaine-prilocaine (EMLA) cream Apply topically as needed.  30 g  6  . LORazepam (ATIVAN) 0.5 MG tablet Take 1 tablet (0.5 mg total) by mouth every 6 (six) hours as needed (Nausea or vomiting).  30 tablet  0  . ondansetron (ZOFRAN) 8 MG tablet Take 1 tablet (8 mg total) by mouth 2 (two) times daily as needed. Take two times a day as needed for nausea or vomiting starting on the third day after chemotherapy.  30 tablet  1  . prochlorperazine (COMPAZINE) 10 MG tablet Take 1 tablet (10 mg total) by mouth every 6 (six) hours as needed (Nausea or vomiting).  30 tablet  1  . prochlorperazine (COMPAZINE) 25 MG suppository Place 1 suppository (25 mg total) rectally every 12 (twelve) hours as needed for nausea.  12 suppository  3    No Known Allergies  REVIEW OF SYSTEMS: Pulmonary:  Smokes 1/2 ppd. She knows this is bad for her health.  SOCIAL and FAMILY HISTORY: Married.   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  PHYSICAL EXAM: LMP 11/28/2012   General: AA F who is alert and generally healthy appearing.  Breasts:  Left:  Absent.  Wound looks good.    Left axillary wound okay, drain with serous drainage.  Right:  She has acne in the LIQ of both breasts. Chest:  Right subclavian power port. Extremities:  Needs to work on moving her left arm more.  DATA REVIEWED: No new data.  Ovidio Kin, MD,  [facility] [address] [place]  [address]   [place], [place]    [address] Phone:  [phone] FAX:  [phone]

## 2012-12-21 NOTE — Telephone Encounter (Signed)
Per staff message I have scheduled appts. JMW  

## 2012-12-28 ENCOUNTER — Ambulatory Visit (INDEPENDENT_AMBULATORY_CARE_PROVIDER_SITE_OTHER): Payer: 59 | Admitting: Surgery

## 2012-12-28 ENCOUNTER — Ambulatory Visit (HOSPITAL_COMMUNITY): Payer: 59 | Attending: Cardiovascular Disease | Admitting: Radiology

## 2012-12-28 ENCOUNTER — Encounter (INDEPENDENT_AMBULATORY_CARE_PROVIDER_SITE_OTHER): Payer: Self-pay | Admitting: Surgery

## 2012-12-28 VITALS — BP 142/82 | HR 80 | Temp 96.9°F | Resp 18 | Ht 64.0 in | Wt 208.2 lb

## 2012-12-28 DIAGNOSIS — C50919 Malignant neoplasm of unspecified site of unspecified female breast: Secondary | ICD-10-CM | POA: Insufficient documentation

## 2012-12-28 DIAGNOSIS — Z5111 Encounter for antineoplastic chemotherapy: Secondary | ICD-10-CM

## 2012-12-28 DIAGNOSIS — C50419 Malignant neoplasm of upper-outer quadrant of unspecified female breast: Secondary | ICD-10-CM

## 2012-12-28 DIAGNOSIS — I369 Nonrheumatic tricuspid valve disorder, unspecified: Secondary | ICD-10-CM | POA: Insufficient documentation

## 2012-12-28 NOTE — Progress Notes (Signed)
Re:   Hannah Hale DOB:   May 08, 1966 MRN:   [id number]  [facility]  ASSESSMENT AND PLAN: 1.  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  LVI identified.  1/2 lymph nodes involved.  T3, N1., ER - neg, PR - neg., Ki67 - 30% (Stage IIIA)  Saw Khan (did see Dr. Rubin)/Dr. Michell Heinrich.  Left completion axillary node dissection/right subclavian power port - 12/08/2012  Final path - 0/10 nodes (so final total was 1/12 nodes)   Drain removed today - but some left arm pain. I gave her #30 Vicodin.  She asked about a prosthesis, but I want to wait until I see her back.  I will see her back in 3 weeks to check the left axilla/arm - then go to q 6 month appointments.    2.  Smokes cigarettes - 1/2 pack per day  Going to try to quit. 3.  Chronic anemia.  REFERRING PHYSICIAN: Provider Not In System  HISTORY OF PRESENT ILLNESS: GLENA Hale is a 47 y.o. (DOB: 12-27-1965)  AA female whose primary care physician is Provider Not In System and through the [facility] for left breast cancer.  She sees Dr. Mitchel Honour at [facility].  She comes today for followup of the drain in her left axilla.  Her drain drained about 40 cc per yesterday.  The rest of her incision is doing well.   She is having some pain and irritation, probably secondary from the drain.  She may even have some mild lymphangitis.  History of left breast cancer: Ms. Soderberg felt a lumpiness in her left breast recently.  This prompted a mammogram.  She has had no prior breast problems or surgery.  This was her first mammogram. She still is having regular periods. LMP was 3 weeks.  Has no children.  No family history of breast cancer. MRI - 09/08/2012.  The abnormal area in total measures 17.6(AP) x 8.8 (trv) x 9.4 (CC) cm.   Past Medical History  Diagnosis Date  . Coughing     cold recent   . Breast cancer     left, Sept 20 2013     Current Outpatient Prescriptions  Medication  Sig Dispense Refill  . dexamethasone (DECADRON) 4 MG tablet Take 2 tablets by mouth once a day on the day after chemotherapy and then take 2 tablets two times a day for 2 days. Take with food.  30 tablet  1  . diphenhydrAMINE (SOMINEX) 25 MG tablet Take 50 mg by mouth 2 (two) times daily as needed. For itching      . HYDROcodone-acetaminophen (NORCO/VICODIN) 5-325 MG per tablet Take 1 tablet by mouth every 6 (six) hours as needed. Pain      . HYDROcodone-acetaminophen (NORCO/VICODIN) 5-325 MG per tablet Take 1-2 tablets by mouth every 6 (six) hours as needed for pain.  30 tablet  1  . lidocaine-prilocaine (EMLA) cream Apply topically as needed.  30 g  6  . LORazepam (ATIVAN) 0.5 MG tablet Take 1 tablet (0.5 mg total) by mouth every 6 (six) hours as needed (Nausea or vomiting).  30 tablet  0  . ondansetron (ZOFRAN) 8 MG tablet Take 1 tablet (8 mg total) by mouth 2 (two) times daily as needed. Take two times a day as needed for nausea or vomiting starting on the third day after chemotherapy.  30 tablet  1  . prochlorperazine (COMPAZINE) 10 MG tablet Take 1  tablet (10 mg total) by mouth every 6 (six) hours as needed (Nausea or vomiting).  30 tablet  1  . prochlorperazine (COMPAZINE) 25 MG suppository Place 1 suppository (25 mg total) rectally every 12 (twelve) hours as needed for nausea.  12 suppository  3    No Known Allergies  REVIEW OF SYSTEMS: Pulmonary:  Smokes 1/2 ppd. She knows this is bad for her health.  SOCIAL and FAMILY HISTORY: Married.   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  PHYSICAL EXAM: BP 142/82  Pulse 80  Temp 96.9 F (36.1 C) (Temporal)  Resp 18  Ht 5\' 4"  (1.626 m)  Wt 208 lb 3.2 oz (94.439 kg)  BMI 35.74 kg/m2  LMP 11/28/2012   General: AA F who is alert and generally healthy appearing.  Breasts:  Left:  Absent.  Wound looks good.    Left axillary wound okay, drain fluid is cloudy.  I removed the left axillary drain  today.  Right:  She has acne in the LIQ of both breasts. Chest:  Right subclavian power port. Extremities:  Needs to work on moving her left arm more, but her left arm is sore.  DATA REVIEWED: No new data.  Ovidio Kin, MD,  [facility] [address] [place]  [address]   [place], [place]    [address] Phone:  [phone] FAX:  [phone]

## 2012-12-28 NOTE — Progress Notes (Signed)
Echocardiogram performed.  

## 2013-01-01 NOTE — Progress Notes (Signed)
OFFICE PROGRESS NOTE  CC Dr. Ovidio Kin Dr. 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 Pierce Crane Dr. Ovidio Kin and Dr. Lurline Hare.Patient originally felt lumpiness in her left breast. This prompted a mammogram.Her mammogram was performed on 08/25/2012 showed pleomorphic calcifications of the entire upper outer left breast measuring 11 x 12 x 15 cm. On exam she had a for a multinodular mass like area over the left upper quadrant. Ultrasound confirmed multiple ill-defined hypoechoic areas. Ultrasound of the left axilla showed a few abnormal appearing lymph nodes. Biopsy of the lymph nodes and the breast was performed on 09/01/2012. 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 The ER was 2% PR 0%.   #2Patient was recommended A mastectomy. 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 Lymphovascular invasion was identified all resection margins were negative the 2 sentinel nodes were removed one was positive for metastatic disease. Patient then went on to have a full axillary lymph node dissection performed the pathology showed 10 lymph nodes negative for metastatic disease therefore 1 of 12 lymph nodes were positive. Final pathologic staging was T T3 N1. Tumor was ER negative PR negative HER-2/neu negative.  #3 patient did have genetic counseling performed but she declined genetic testing at that time. We discussed genetic counseling and testing again in patient is now agreeable and we will get this taken care of.  #4 patient was seen by  Dr. Pierce Crane on 11/17/2012 he recommended PET scan which was negative for metastatic disease. He also recommended adjuvant chemotherapy consisting of FEC given every 2 weeks for a total of 4 cycles. I have recommended Weekly Taxol and carboplatinum For a total of 12 weeks after completion of FEC.  CURRENT THERAPY:Patient will proceed with cycle 1 day 1 of FEC on 01/03/2013  INTERVAL HISTORY: JOYICE MAGDA 47 y.o. female returns for Followup visit. Postoperatively she is doing well without any significant complaints. She and I discussed her final pathology today. I also discussed with her the rationale for getting genetic testing performed. She is now agreeable to it. She is denying any nausea vomiting fevers chills night sweats headaches shortness of breath chest pains or palpitations. Remainder of the 10 point review of systems is negative  MEDICAL HISTORY: Past Medical History  Diagnosis Date  . Coughing     cold recent   . Breast cancer     left, Sept 20 2013    ALLERGIES:   has no known allergies.  MEDICATIONS:  Current Outpatient Prescriptions  Medication Sig Dispense Refill  . diphenhydrAMINE (SOMINEX) 25 MG tablet Take 50 mg by mouth 2 (two) times daily as needed. For itching      . HYDROcodone-acetaminophen (NORCO/VICODIN) 5-325 MG per tablet Take 1 tablet by mouth every 6 (six) hours as needed. Pain      . HYDROcodone-acetaminophen (NORCO/VICODIN) 5-325 MG per tablet Take 1-2 tablets by mouth every 6 (six) hours as needed for pain.  30 tablet  1  . dexamethasone (DECADRON) 4 MG tablet Take 2 tablets by mouth once a day on the day  after chemotherapy and then take 2 tablets two times a day for 2 days. Take with food.  30 tablet  1  . lidocaine-prilocaine (EMLA) cream Apply topically as needed.  30 g  6  . LORazepam (ATIVAN) 0.5 MG tablet Take 1 tablet (0.5 mg total) by mouth every 6 (six) hours as needed (Nausea or vomiting).  30 tablet  0  . ondansetron (ZOFRAN) 8 MG tablet  Take 1 tablet (8 mg total) by mouth 2 (two) times daily as needed. Take two times a day as needed for nausea or vomiting starting on the third day after chemotherapy.  30 tablet  1  . prochlorperazine (COMPAZINE) 10 MG tablet Take 1 tablet (10 mg total) by mouth every 6 (six) hours as needed (Nausea or vomiting).  30 tablet  1  . prochlorperazine (COMPAZINE) 25 MG suppository Place 1 suppository (25 mg total) rectally every 12 (twelve) hours as needed for nausea.  12 suppository  3    SURGICAL HISTORY:  Past Surgical History  Procedure Date  . Simple mastectomy w/ sentinel node biopsy 11/03/2012  . Simple mastectomy with axillary sentinel node biopsy 11/03/2012    Procedure: SIMPLE MASTECTOMY WITH AXILLARY SENTINEL NODE BIOPSY;  Surgeon: Kandis Cocking, MD;  Location: [facility];  Service: General;  Laterality: Left;  . Portacath placement 12/08/2012    Procedure: INSERTION PORT-A-CATH;  Surgeon: Kandis Cocking, MD;  Location: [facility];  Service: General;  Laterality: N/A;  Power Port Placment and Left Axillary Node Dissection  . Axillary lymph node dissection 12/08/2012    Procedure: AXILLARY LYMPH NODE DISSECTION;  Surgeon: Kandis Cocking, MD;  Location: [facility];  Service: General;  Laterality: Left;    REVIEW OF SYSTEMS:  Pertinent items are noted in HPI.   HEALTH MAINTENANCE:   PHYSICAL EXAMINATION: Blood pressure 153/85, pulse 85, temperature 97.7 F (36.5 C), resp. rate 20, height 5\' 4"  (1.626 m), weight 207 lb 11.2 oz (94.212 kg), last menstrual period 11/28/2012. Body mass index is 35.65 kg/(m^2). ECOG PERFORMANCE STATUS: 0 - Asymptomatic   General appearance: alert, cooperative and appears stated age Lymph nodes: Cervical, supraclavicular, and axillary nodes normal. Resp: clear to auscultation bilaterally Back: symmetric, no curvature. ROM normal. No CVA tenderness. Cardio: regular rate and rhythm GI: soft, non-tender; bowel sounds normal; no masses,  no organomegaly Extremities:  extremities normal, atraumatic, no cyanosis or edema Neurologic: Grossly normal   LABORATORY DATA: Lab Results  Component Value Date   WBC 11.2* 12/20/2012   HGB 11.4* 12/20/2012   HCT 35.9 12/20/2012   MCV 83.3 12/20/2012   PLT 280 12/20/2012      Chemistry      Component Value Date/Time   NA 141 12/20/2012 1503   NA 136 11/04/2012 0637   K 4.2 12/20/2012 1503   K 3.5 11/04/2012 0637   CL 112* 12/20/2012 1503   CL 103 11/04/2012 0637   CO2 23 12/20/2012 1503   CO2 22 11/04/2012 0637   BUN 10.0 12/20/2012 1503   BUN 6 11/04/2012 0637   CREATININE 0.8 12/20/2012 1503   CREATININE 0.59 11/04/2012 0637      Component Value Date/Time   CALCIUM 9.3 12/20/2012 1503   CALCIUM 8.4 11/04/2012 0637   ALKPHOS 125 12/20/2012 1503   AST 19 12/20/2012 1503   ALT 15 12/20/2012 1503   BILITOT <0.20 Repeated and Verified 12/20/2012 1503       RADIOGRAPHIC STUDIES:  Dg Chest Port 1 View  12/08/2012  *RADIOLOGY REPORT*  Clinical Data: Post line placement.  Rule out pneumothorax.  The  PORTABLE CHEST - 1 VIEW  Comparison: 11/25/2012 PET CT.  10/26/2012 chest x-ray.  Findings: Curvilinear appearance right apex along the undersurface of the posterior aspect of the right second rib probably related to rib rather than pneumothorax.   This can be assessed on follow-up examination if there are any progressive symptoms to suggest pneumothorax.  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  Cardiomegaly.  Mild central pulmonary vascular prominence.  Prior left breast surgery and left axillary lymph node dissection.  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  No definitive pneumothorax.  Curvilinear structure right lung apex probably associated with the undersurface of rib as noted above.  This is a call report.   Original Report Authenticated By: Lacy Duverney, M.D.    Dg C-arm 1-60 Min-no Report  12/08/2012  CLINICAL DATA: port-a-cath insertion   C-ARM 1-60 MINUTES  Fluoroscopy was utilized by the  requesting physician.  No radiographic  interpretation.      ASSESSMENT: 47 year old female with   #1stage III a( T3 N1) invasive ductal carcinoma she is status post mastectomy with axillary lymph node dissection with one of 12 lymph nodes positive for metastatic disease. Tumor was ER negative PR negative HER-2/neu negative. She had her mastectomy on 11/03/2012 with axillary lymph node dissection performed on 12/08/2012 and she had a Port-A-Cath placed at that time as well. He should have no evidence of metastatic disease elsewhere distally.  #2 patient is a good candidate for adjuvant chemotherapy. She will proceed with FEC every 2 weeks with day 2 Neulasta for 4 cycles. After that is completed then she will receive Taxol and carboplatinum on a weekly basis for 12 weeks. I discussed the rationale for this.  #3 patient does need echocardiogram and I will get this scheduled. She already has had chemotherapy teaching class.My hope is to get her started on chemotherapy on  /121/2014.  #4 at some point I will also refer her back to the genetic counselor for genetic testing   PLAN:   #1 proceed with echocardiogram.  #2 she will receive FEC chemotherapy starting on Jan 03 2013. I have gone ahead and written her orders as well as Center anti-emetics and Emla cream to her pharmacy.We discussed the risks and benefits and rationale for adjuvant therapy.  #3 she will be seen back on January 21 for cycle 1 day 1 of her chemotherapy.   All questions were answered. The patient knows to call the clinic with any problems, questions or concerns. We can certainly see the patient much sooner if necessary.  I spent 55 minutes counseling the patient face to face. The total time spent in the appointment was 60 minutes.    Drue Second, MD Medical/Oncology [facility] [phone] (beeper) [phone] (Office)

## 2013-01-03 ENCOUNTER — Encounter: Payer: Self-pay | Admitting: Oncology

## 2013-01-03 ENCOUNTER — Other Ambulatory Visit: Payer: Self-pay | Admitting: Certified Registered Nurse Anesthetist

## 2013-01-03 ENCOUNTER — Encounter: Payer: Self-pay | Admitting: Adult Health

## 2013-01-03 ENCOUNTER — Other Ambulatory Visit (HOSPITAL_BASED_OUTPATIENT_CLINIC_OR_DEPARTMENT_OTHER): Payer: 59 | Admitting: Lab

## 2013-01-03 ENCOUNTER — Ambulatory Visit (HOSPITAL_BASED_OUTPATIENT_CLINIC_OR_DEPARTMENT_OTHER): Payer: 59 | Admitting: Adult Health

## 2013-01-03 ENCOUNTER — Ambulatory Visit: Payer: 59

## 2013-01-03 VITALS — BP 144/85 | HR 83 | Temp 98.6°F | Resp 20 | Ht 64.0 in | Wt 209.5 lb

## 2013-01-03 DIAGNOSIS — C50419 Malignant neoplasm of upper-outer quadrant of unspecified female breast: Secondary | ICD-10-CM

## 2013-01-03 DIAGNOSIS — Z171 Estrogen receptor negative status [ER-]: Secondary | ICD-10-CM

## 2013-01-03 DIAGNOSIS — C773 Secondary and unspecified malignant neoplasm of axilla and upper limb lymph nodes: Secondary | ICD-10-CM

## 2013-01-03 LAB — CBC WITH DIFFERENTIAL/PLATELET
Basophils Absolute: 0.2 10*3/uL — ABNORMAL HIGH (ref 0.0–0.1)
Eosinophils Absolute: 0.3 10*3/uL (ref 0.0–0.5)
HCT: 36.1 % (ref 34.8–46.6)
HGB: 11.8 g/dL (ref 11.6–15.9)
MONO#: 0.7 10*3/uL (ref 0.1–0.9)
NEUT#: 6.6 10*3/uL — ABNORMAL HIGH (ref 1.5–6.5)
NEUT%: 64.7 % (ref 38.4–76.8)
RDW: 31.2 % — ABNORMAL HIGH (ref 11.2–14.5)
lymph#: 2.5 10*3/uL (ref 0.9–3.3)

## 2013-01-03 LAB — COMPREHENSIVE METABOLIC PANEL (CC13)
AST: 16 U/L (ref 5–34)
Albumin: 3.4 g/dL — ABNORMAL LOW (ref 3.5–5.0)
BUN: 10 mg/dL (ref 7.0–26.0)
CO2: 24 mEq/L (ref 22–29)
Calcium: 9.5 mg/dL (ref 8.4–10.4)
Chloride: 106 mEq/L (ref 98–107)
Creatinine: 0.7 mg/dL (ref 0.6–1.1)
Glucose: 98 mg/dl (ref 70–99)
Potassium: 4.3 mEq/L (ref 3.5–5.1)

## 2013-01-03 LAB — CANCER ANTIGEN 27.29: CA 27.29: 28 U/mL (ref 0–39)

## 2013-01-03 NOTE — Patient Instructions (Addendum)
We will get you assistance via financial care counseling and delay your chemotherapy for one week.  Once you get your assistance card, call us [phone] and we will prescribe your medications to the [facility].  Please call us if you have any questions or concerns.

## 2013-01-03 NOTE — Progress Notes (Signed)
OFFICE PROGRESS NOTE  CC Dr. Ovidio Kin Dr. 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 Pierce Crane Dr. Ovidio Kin and Dr. Lurline Hare.Patient originally felt lumpiness in her left breast. This prompted a mammogram.Her mammogram was performed on 08/25/2012 showed pleomorphic calcifications of the entire upper outer left breast measuring 11 x 12 x 15 cm. On exam she had a for a multinodular mass like area over the left upper quadrant. Ultrasound confirmed multiple ill-defined hypoechoic areas. Ultrasound of the left axilla showed a few abnormal appearing lymph nodes. Biopsy of the lymph nodes and the breast was performed on 09/01/2012. 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 The ER was 2% PR 0%.   #2Patient was recommended A mastectomy. 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 Lymphovascular invasion was identified all resection margins were negative the 2 sentinel nodes were removed one was positive for metastatic disease. Patient then went on to have a full axillary lymph node dissection performed the pathology showed 10 lymph nodes negative for metastatic disease therefore 1 of 12 lymph nodes were positive. Final pathologic staging was T T3 N1. Tumor was ER negative PR negative HER-2/neu negative.  #3 patient did have genetic counseling performed but she declined genetic testing at that time. We discussed genetic counseling and testing again in patient is now agreeable and we will get this taken care of.  #4 patient was seen by  Dr. Pierce Crane on 11/17/2012 he recommended PET scan which was negative for metastatic disease. He also recommended adjuvant chemotherapy consisting of FEC given every 2 weeks for a total of 4 cycles. I have recommended Weekly Taxol and carboplatinum For a total of 12 weeks after completion of FEC.  CURRENT THERAPY:Patient will proceed with cycle 1 day 1 of FEC on 01/10/2013  INTERVAL HISTORY: JEFFRIE STANDER 47 y.o. female returns for Followup visit.  She is here to start her chemotherapy, however she did not get her anti-nausea medication or emla cream filled due to the inability to afford her copays at [facility].  She has not filed for financial assistance.  Otherwise, she's doing well and w/o questions/concerns.   MEDICAL HISTORY: Past Medical History  Diagnosis Date  . Coughing     cold recent   . Breast cancer     left, Sept 20 2013    ALLERGIES:   has no known allergies.  MEDICATIONS:  Current Outpatient Prescriptions  Medication Sig Dispense Refill  . dexamethasone (DECADRON) 4 MG tablet Take 2 tablets by mouth once a day on the day after chemotherapy and then take 2 tablets two times a day for 2 days. Take with food.  30 tablet  1  . diphenhydrAMINE (SOMINEX) 25 MG tablet Take 50 mg by mouth 2 (two) times daily as needed. For itching      . HYDROcodone-acetaminophen (NORCO/VICODIN) 5-325 MG per tablet Take 1 tablet by mouth every 6 (six) hours as needed. Pain      . HYDROcodone-acetaminophen (NORCO/VICODIN) 5-325 MG per tablet Take 1-2 tablets by mouth every 6 (six) hours as needed for  pain.  30 tablet  1  . lidocaine-prilocaine (EMLA) cream Apply topically as needed.  30 g  6  . LORazepam (ATIVAN) 0.5 MG tablet Take 1 tablet (0.5 mg total) by mouth every 6 (six) hours as needed (Nausea or vomiting).  30 tablet  0  . ondansetron (ZOFRAN) 8 MG tablet Take 1 tablet (8 mg total) by mouth 2 (two) times daily as needed. Take two times a day as needed for nausea or vomiting starting on  the third day after chemotherapy.  30 tablet  1  . prochlorperazine (COMPAZINE) 10 MG tablet Take 1 tablet (10 mg total) by mouth every 6 (six) hours as needed (Nausea or vomiting).  30 tablet  1  . prochlorperazine (COMPAZINE) 25 MG suppository Place 1 suppository (25 mg total) rectally every 12 (twelve) hours as needed for nausea.  12 suppository  3    SURGICAL HISTORY:  Past Surgical History  Procedure Date  . Simple mastectomy w/ sentinel node biopsy 11/03/2012  . Simple mastectomy with axillary sentinel node biopsy 11/03/2012    Procedure: SIMPLE MASTECTOMY WITH AXILLARY SENTINEL NODE BIOPSY;  Surgeon: Kandis Cocking, MD;  Location: [facility] OR;  Service: General;  Laterality: Left;  . Portacath placement 12/08/2012    Procedure: INSERTION PORT-A-CATH;  Surgeon: Kandis Cocking, MD;  Location: [facility];  Service: General;  Laterality: N/A;  Power Port Placment and Left Axillary Node Dissection  . Axillary lymph node dissection 12/08/2012    Procedure: AXILLARY LYMPH NODE DISSECTION;  Surgeon: Kandis Cocking, MD;  Location: [facility];  Service: General;  Laterality: Left;    REVIEW OF SYSTEMS:  General: fatigue (-), night sweats (-), fever (-), pain (-) Lymph: palpable nodes (-) HEENT: vision changes (-), mucositis (-), gum bleeding (-), epistaxis (-) Cardiovascular: chest pain (-), palpitations (-) Pulmonary: shortness of breath (-), dyspnea on exertion (-), cough (-), hemoptysis (-) GI:  Early satiety (-), melena (-), dysphagia (-), nausea/vomiting (-), diarrhea (-) GU: dysuria (-), hematuria (-), incontinence (-) Musculoskeletal: joint swelling (-), joint pain (-), back pain (-) Neuro: weakness (-), numbness (-), headache (-), confusion (-) Skin: Rash (-), lesions (-), dryness (-) Psych: depression (-), suicidal/homicidal ideation (-), feeling of hopelessness (-)   PHYSICAL EXAMINATION: Blood pressure 144/85, pulse 83, temperature 98.6 F (37 C), temperature source Oral, resp. rate 20,  height 5\' 4"  (1.626 m), weight 209 lb 8 oz (95.029 kg), last menstrual period 11/28/2012. Body mass index is 35.96 kg/(m^2). General: Patient is a well appearing female in no acute distress HEENT: PERRLA, sclerae anicteric no conjunctival pallor, MMM Neck: supple, no palpable adenopathy Lungs: clear to auscultation bilaterally, no wheezes, rhonchi, or rales Cardiovascular: regular rate rhythm, S1, S2, no murmurs, rubs or gallops Abdomen: Soft, non-tender, non-distended, normoactive bowel sounds, no HSM Extremities: warm and well perfused, no clubbing, cyanosis, or edema Skin: No rashes or lesions Neuro: Non-focal ECOG PERFORMANCE STATUS: 0 - Asymptomatic   LABORATORY DATA: Lab Results  Component Value Date   WBC 10.2 01/03/2013   HGB 11.8 01/03/2013   HCT 36.1 01/03/2013   MCV 84.8 01/03/2013   PLT 268 01/03/2013      Chemistry      Component Value Date/Time   NA 139 01/03/2013 1331   NA 136 11/04/2012 0637   K 4.3 01/03/2013 1331   K 3.5 11/04/2012 0637   CL 106 01/03/2013 1331   CL 103 11/04/2012 0637   CO2 24 01/03/2013 1331   CO2 22 11/04/2012 5284  BUN 10.0 01/03/2013 1331   BUN 6 11/04/2012 0637   CREATININE 0.7 01/03/2013 1331   CREATININE 0.59 11/04/2012 0637      Component Value Date/Time   CALCIUM 9.5 01/03/2013 1331   CALCIUM 8.4 11/04/2012 0637   ALKPHOS 129 01/03/2013 1331   AST 16 01/03/2013 1331   ALT 12 01/03/2013 1331   BILITOT 0.22 01/03/2013 1331       RADIOGRAPHIC STUDIES:  Dg Chest Port 1 View  12/08/2012  *RADIOLOGY REPORT*  Clinical Data: Post line placement.  Rule out pneumothorax.  The  PORTABLE CHEST - 1 VIEW  Comparison: 11/25/2012 PET CT.  10/26/2012 chest x-ray.  Findings: Curvilinear appearance right apex along the undersurface of the posterior aspect of the right second rib probably related to rib rather than pneumothorax.   This can be assessed on follow-up examination if there are any progressive symptoms to suggest pneumothorax.  Right central  line tip distal superior vena cava.  Cardiomegaly.  Mild central pulmonary vascular prominence.  Prior left breast surgery and left axillary lymph node dissection.  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  No definitive pneumothorax.  Curvilinear structure right lung apex probably associated with the undersurface of rib as noted above.  This is a call report.   Original Report Authenticated By: Lacy Duverney, M.D.    Dg C-arm 1-60 Min-no Report  12/08/2012  CLINICAL DATA: port-a-cath insertion   C-ARM 1-60 MINUTES  Fluoroscopy was utilized by the requesting physician.  No radiographic  interpretation.      ASSESSMENT: 47 year old female with   #1stage III a( T3 N1) invasive ductal carcinoma she is status post mastectomy with axillary lymph node dissection with one of 12 lymph nodes positive for metastatic disease. Tumor was ER negative PR negative HER-2/neu negative. She had her mastectomy on 11/03/2012 with axillary lymph node dissection performed on 12/08/2012 and she had a Port-A-Cath placed at that time as well. He should have no evidence of metastatic disease elsewhere distally.  #2 patient is a good candidate for adjuvant chemotherapy. She will proceed with FEC every 2 weeks with day 2 Neulasta for 4 cycles. After that is completed then she will receive Taxol and carboplatinum on a weekly basis for 12 weeks. I discussed the rationale for this.  #3 patient does need echocardiogram and I will get this scheduled. She already has had chemotherapy teaching class.My hope is to get her started on chemotherapy on  /121/2014.  #4 at some point I will also refer her back to the genetic counselor for genetic testing   PLAN:   #1 Ms. Goldsmith will delay her FEC chemo x 1 week since she doesn't have her anti-emetics to take after he chemotherapy.  I brought her over to the financial care counselor and they are starting paperwork for financial assistance.    #2 she will be seen back  on January 28 for cycle 1 day 1 of her chemotherapy.   All questions were answered. The patient knows to call the clinic with any problems, questions or concerns. We can certainly see the patient much sooner if necessary.  I spent 15 minutes counseling the patient face to face. The total time spent in the appointment was 30 minutes.  Cherie Ouch Lyn Hollingshead, NP Medical Oncology [facility] Phone: [phone]

## 2013-01-03 NOTE — Progress Notes (Signed)
Gave patient an application for assistance. It is just her and hubby and he gets disability.They will bring the bank statement and proof of disability back to me on tomorrow. She is trying to get some meds prior to her taking chemo.

## 2013-01-04 ENCOUNTER — Other Ambulatory Visit: Payer: Self-pay | Admitting: Certified Registered Nurse Anesthetist

## 2013-01-04 ENCOUNTER — Ambulatory Visit: Payer: 59

## 2013-01-06 ENCOUNTER — Telehealth: Payer: Self-pay | Admitting: Oncology

## 2013-01-06 ENCOUNTER — Other Ambulatory Visit: Payer: Self-pay | Admitting: Medical Oncology

## 2013-01-06 ENCOUNTER — Telehealth: Payer: Self-pay | Admitting: Medical Oncology

## 2013-01-06 NOTE — Telephone Encounter (Signed)
Patient called with concern regarding upcoming tx appt, states she is waiting to hear from financial services regarding preauthorization for medication. Per patient she has been speaking with Requel in financial services, will forward pts concerns to her.

## 2013-01-06 NOTE — Telephone Encounter (Signed)
Per 1/24 pof added inj for 1/29. Pt's tx's have been delayed to 1/28 and the tx is q2w other inj appts adjusted. S/w pt re next appts for 1/28 and 1/29. Pt to get schedule when she comes in.

## 2013-01-09 ENCOUNTER — Encounter: Payer: Self-pay | Admitting: Oncology

## 2013-01-09 ENCOUNTER — Other Ambulatory Visit: Payer: Self-pay | Admitting: Emergency Medicine

## 2013-01-09 NOTE — Progress Notes (Signed)
Patient also qualifies for 600.00 [employer] and 400.00 [facility]. I will have the patient sign letters when she comes in the morning.

## 2013-01-09 NOTE — Progress Notes (Signed)
Patient called and came by for the processing of her application. She needs her meds prior to chemo on tomorrow. She gets help from her dad. I advised I needed letter of support from dad. Her dad Jake Shark called and said he helps them out with bills. I told her I would process with his verbal consent of helping, but she needs to bring in letter of support from him on tomorrow. I will call and let the pharmacy know of her 400.00 grant for the meds. 45family income [id number] and letter of support. 100% ind 01/09/13-07/09/13. I will give her letter and card when she comes in in the morning. Will scan all documents and then add letter of support in the morning.

## 2013-01-10 ENCOUNTER — Telehealth: Payer: Self-pay | Admitting: Oncology

## 2013-01-10 ENCOUNTER — Telehealth (INDEPENDENT_AMBULATORY_CARE_PROVIDER_SITE_OTHER): Payer: Self-pay

## 2013-01-10 ENCOUNTER — Ambulatory Visit (HOSPITAL_BASED_OUTPATIENT_CLINIC_OR_DEPARTMENT_OTHER): Payer: 59 | Admitting: Oncology

## 2013-01-10 ENCOUNTER — Other Ambulatory Visit (HOSPITAL_BASED_OUTPATIENT_CLINIC_OR_DEPARTMENT_OTHER): Payer: 59 | Admitting: Lab

## 2013-01-10 ENCOUNTER — Ambulatory Visit (HOSPITAL_BASED_OUTPATIENT_CLINIC_OR_DEPARTMENT_OTHER): Payer: 59

## 2013-01-10 ENCOUNTER — Other Ambulatory Visit: Payer: Self-pay | Admitting: Emergency Medicine

## 2013-01-10 VITALS — BP 156/96 | HR 83 | Temp 98.3°F | Resp 20 | Ht 64.0 in | Wt 212.9 lb

## 2013-01-10 DIAGNOSIS — C50419 Malignant neoplasm of upper-outer quadrant of unspecified female breast: Secondary | ICD-10-CM

## 2013-01-10 DIAGNOSIS — Z9010 Acquired absence of unspecified breast and nipple: Secondary | ICD-10-CM

## 2013-01-10 DIAGNOSIS — Z171 Estrogen receptor negative status [ER-]: Secondary | ICD-10-CM

## 2013-01-10 DIAGNOSIS — C50919 Malignant neoplasm of unspecified site of unspecified female breast: Secondary | ICD-10-CM

## 2013-01-10 DIAGNOSIS — Z5111 Encounter for antineoplastic chemotherapy: Secondary | ICD-10-CM

## 2013-01-10 LAB — CBC WITH DIFFERENTIAL/PLATELET
Basophils Absolute: 0.1 10*3/uL (ref 0.0–0.1)
EOS%: 3.1 % (ref 0.0–7.0)
HGB: 12.5 g/dL (ref 11.6–15.9)
MCH: 28.3 pg (ref 25.1–34.0)
MONO#: 0.6 10*3/uL (ref 0.1–0.9)
NEUT#: 5 10*3/uL (ref 1.5–6.5)
RDW: 29.4 % — ABNORMAL HIGH (ref 11.2–14.5)
WBC: 8.5 10*3/uL (ref 3.9–10.3)
lymph#: 2.5 10*3/uL (ref 0.9–3.3)

## 2013-01-10 LAB — COMPREHENSIVE METABOLIC PANEL (CC13)
ALT: 12 U/L (ref 0–55)
AST: 14 U/L (ref 5–34)
Albumin: 3.4 g/dL — ABNORMAL LOW (ref 3.5–5.0)
BUN: 9.2 mg/dL (ref 7.0–26.0)
CO2: 23 mEq/L (ref 22–29)
Calcium: 9.2 mg/dL (ref 8.4–10.4)
Chloride: 108 mEq/L — ABNORMAL HIGH (ref 98–107)
Potassium: 4.1 mEq/L (ref 3.5–5.1)

## 2013-01-10 MED ORDER — HEPARIN SOD (PORK) LOCK FLUSH 100 UNIT/ML IV SOLN
500.0000 [IU] | Freq: Once | INTRAVENOUS | Status: AC | PRN
  Administered 2013-01-10: 500 [IU]
  Filled 2013-01-10: qty 5

## 2013-01-10 MED ORDER — PALONOSETRON HCL INJECTION 0.25 MG/5ML
0.2500 mg | Freq: Once | INTRAVENOUS | Status: AC
  Administered 2013-01-10: 0.25 mg via INTRAVENOUS

## 2013-01-10 MED ORDER — SODIUM CHLORIDE 0.9 % IV SOLN
500.0000 mg/m2 | Freq: Once | INTRAVENOUS | Status: AC
  Administered 2013-01-10: 1040 mg via INTRAVENOUS
  Filled 2013-01-10: qty 52

## 2013-01-10 MED ORDER — SODIUM CHLORIDE 0.9 % IV SOLN
150.0000 mg | Freq: Once | INTRAVENOUS | Status: AC
  Administered 2013-01-10: 150 mg via INTRAVENOUS
  Filled 2013-01-10: qty 5

## 2013-01-10 MED ORDER — FLUOROURACIL CHEMO INJECTION 2.5 GM/50ML
500.0000 mg/m2 | Freq: Once | INTRAVENOUS | Status: AC
  Administered 2013-01-10: 1050 mg via INTRAVENOUS
  Filled 2013-01-10: qty 21

## 2013-01-10 MED ORDER — SODIUM CHLORIDE 0.9 % IV SOLN
Freq: Once | INTRAVENOUS | Status: AC
  Administered 2013-01-10: 13:00:00 via INTRAVENOUS

## 2013-01-10 MED ORDER — EPIRUBICIN HCL CHEMO IV INJECTION 200 MG/100ML
100.0000 mg/m2 | Freq: Once | INTRAVENOUS | Status: AC
  Administered 2013-01-10: 206 mg via INTRAVENOUS
  Filled 2013-01-10: qty 103

## 2013-01-10 MED ORDER — SODIUM CHLORIDE 0.9 % IJ SOLN
10.0000 mL | INTRAMUSCULAR | Status: DC | PRN
  Administered 2013-01-10: 10 mL
  Filled 2013-01-10: qty 10

## 2013-01-10 MED ORDER — DEXAMETHASONE SODIUM PHOSPHATE 4 MG/ML IJ SOLN: 12.0000 mg | Freq: Once | INTRAMUSCULAR | Status: DC

## 2013-01-10 MED ORDER — LORAZEPAM 2 MG/ML IJ SOLN
0.5000 mg | Freq: Once | INTRAMUSCULAR | Status: AC
  Administered 2013-01-10: 0.5 mg via INTRAVENOUS

## 2013-01-10 NOTE — Telephone Encounter (Signed)
Printed pt appt schedule for Jan thru March

## 2013-01-10 NOTE — Telephone Encounter (Signed)
Pt aware of appt 01/20/13@ 2:30

## 2013-01-10 NOTE — Patient Instructions (Addendum)
Proceed with chemotherapy today

## 2013-01-10 NOTE — Patient Instructions (Addendum)
[facility] Discharge Instructions for Patients Receiving Chemotherapy  Today you received the following chemotherapy agents Ellence, 5 FU, Cytoxan To help prevent nausea and vomiting after your treatment, we encourage you to take your nausea medication as directed.   If you develop nausea and vomiting that is not controlled by your nausea medication, call the clinic. If it is after clinic hours your family physician or the after hours number for the clinic or go to the Emergency Department.   BELOW ARE SYMPTOMS THAT SHOULD BE REPORTED IMMEDIATELY:  *FEVER GREATER THAN 100.5 F  *CHILLS WITH OR WITHOUT FEVER  NAUSEA AND VOMITING THAT IS NOT CONTROLLED WITH YOUR NAUSEA MEDICATION  *UNUSUAL SHORTNESS OF BREATH  *UNUSUAL BRUISING OR BLEEDING  TENDERNESS IN MOUTH AND THROAT WITH OR WITHOUT PRESENCE OF ULCERS  *URINARY PROBLEMS  *BOWEL PROBLEMS  UNUSUAL RASH Items with * indicate a potential emergency and should be followed up as soon as possible.  One of the nurses will contact you 24 hours after your treatment. Please let the nurse know about any problems that you may have experienced. Feel free to call the clinic you have any questions or concerns. The clinic phone number is [phone].   I have been informed and understand all the instructions given to me. I know to contact the clinic, my physician, or go to the Emergency Department if any problems should occur. I do not have any questions at this time, but understand that I may call the clinic during office hours   should I have any questions or need assistance in obtaining follow up care.    __________________________________________  _____________  __________ Signature of Patient or Authorized Representative            Date                   Time    __________________________________________ Nurse's Signature

## 2013-01-10 NOTE — Progress Notes (Signed)
OFFICE PROGRESS NOTE  CC Dr. Ovidio Kin Dr. 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 Pierce Crane Dr. Ovidio Kin and Dr. Lurline Hare.Patient originally felt lumpiness in her left breast. This prompted a mammogram.Her mammogram was performed on 08/25/2012 showed pleomorphic calcifications of the entire upper outer left breast measuring 11 x 12 x 15 cm. On exam she had a for a multinodular mass like area over the left upper quadrant. Ultrasound confirmed multiple ill-defined hypoechoic areas. Ultrasound of the left axilla showed a few abnormal appearing lymph nodes. Biopsy of the lymph nodes and the breast was performed on 09/01/2012. 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 The ER was 2% PR 0%.   #2Patient was recommended A mastectomy. 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 Lymphovascular invasion was identified all resection margins were negative the 2 sentinel nodes were removed one was positive for metastatic disease. Patient then went on to have a full axillary lymph node dissection performed the pathology showed 10 lymph nodes negative for metastatic disease therefore 1 of 12 lymph nodes were positive. Final pathologic staging was T T3 N1. Tumor was ER negative PR negative HER-2/neu negative.  #3 patient did have genetic counseling performed but she declined genetic testing at that time. We discussed genetic counseling and testing again in patient is now agreeable and we will get this taken care of.  #4 patient was seen by  Dr. Pierce Crane on 11/17/2012 he recommended PET scan which was negative for metastatic disease. He also recommended adjuvant chemotherapy consisting of FEC given every 2 weeks for a total of 4 cycles. I have recommended Weekly Taxol and carboplatinum For a total of 12 weeks after completion of FEC.  CURRENT THERAPY:Patient will proceed with cycle 1 day 1 of FEC on 01/03/2013  INTERVAL HISTORY: Hannah Hale 47 y.o. female returns for Followup visit.clinically she seems to be doing well. She is status post cycle 1 of FEC. She had some nausea with discussed at length regarding this. But no vomiting. She denies any headaches double vision blurring of vision no fevers chills or night sweats. Remainder of the 10 point review of systems is negative to  MEDICAL HISTORY: Past Medical History  Diagnosis Date  . Coughing     cold recent   . Breast cancer     left, Sept 20 2013    ALLERGIES:   has no known allergies.  MEDICATIONS:  Current Outpatient Prescriptions  Medication Sig Dispense Refill  . dexamethasone (DECADRON) 4 MG tablet Take 2 tablets by mouth once a day on the day after chemotherapy and then take 2 tablets two times a day for 2 days. Take with food.  30 tablet  1  . diphenhydrAMINE (SOMINEX) 25 MG tablet Take 50 mg by mouth 2 (two) times daily as needed. For itching      . lidocaine-prilocaine (EMLA) cream Apply topically as needed.  30 g  6  . HYDROcodone-acetaminophen (NORCO/VICODIN) 5-325 MG per tablet Take 1-2 tablets by mouth every 6 (six) hours as needed for pain.  30 tablet  1  . LORazepam (ATIVAN) 0.5 MG tablet Take 1 tablet (0.5 mg total) by mouth every 6 (six) hours as needed (Nausea or vomiting).  30 tablet  0  . ondansetron (ZOFRAN) 8 MG tablet Take 1 tablet (8 mg total) by mouth 2 (two) times daily as needed. Take two times a day as needed for nausea or vomiting starting on the third day after chemotherapy.  30 tablet  1  . prochlorperazine (COMPAZINE) 10 MG tablet  Take 1 tablet (10 mg total) by mouth every 6 (six) hours as needed (Nausea or vomiting).  30 tablet  1  . prochlorperazine (COMPAZINE) 25 MG suppository Place 1 suppository (25 mg total) rectally every 12 (twelve) hours as needed for nausea.  12 suppository  3    SURGICAL HISTORY:  Past Surgical History  Procedure Date  . Simple mastectomy w/ sentinel node biopsy 11/03/2012  . Simple mastectomy with axillary sentinel node biopsy 11/03/2012    Procedure: SIMPLE MASTECTOMY WITH AXILLARY SENTINEL NODE BIOPSY;  Surgeon: Kandis Cocking, MD;  Location: [facility];  Service: General;  Laterality: Left;  . Portacath placement 12/08/2012    Procedure: INSERTION PORT-A-CATH;  Surgeon: Kandis Cocking, MD;  Location: [facility];  Service: General;  Laterality: N/A;  Power Port Placment and Left Axillary Node Dissection  . Axillary lymph node dissection 12/08/2012    Procedure: AXILLARY LYMPH NODE DISSECTION;  Surgeon: Kandis Cocking, MD;  Location: [facility];  Service: General;  Laterality: Left;    REVIEW OF SYSTEMS:  Pertinent items are noted in HPI.   HEALTH MAINTENANCE:   PHYSICAL EXAMINATION: Blood pressure 156/96, pulse 83, temperature 98.3 F (36.8 C), resp. rate 20, height 5\' 4"  (1.626 m), weight 212 lb 14.4 oz (96.571 kg). Body mass index is 36.54 kg/(m^2). ECOG PERFORMANCE STATUS: 0 - Asymptomatic   General appearance: alert, cooperative and appears stated age Lymph nodes: Cervical, supraclavicular, and axillary nodes normal. Resp: clear to auscultation bilaterally Back: symmetric, no curvature. ROM normal. No CVA tenderness. Cardio: regular rate and rhythm GI: soft, non-tender; bowel sounds normal; no masses,  no organomegaly Extremities: extremities normal, atraumatic, no cyanosis or edema Neurologic: Grossly normal   LABORATORY DATA: Lab Results  Component Value Date   WBC 8.5 01/10/2013   HGB 12.5 01/10/2013   HCT 38.1 01/10/2013   MCV 86.1 01/10/2013   PLT 261 01/10/2013       Chemistry      Component Value Date/Time   NA 139 01/03/2013 1331   NA 136 11/04/2012 0637   K 4.3 01/03/2013 1331   K 3.5 11/04/2012 0637   CL 106 01/03/2013 1331   CL 103 11/04/2012 0637   CO2 24 01/03/2013 1331   CO2 22 11/04/2012 0637   BUN 10.0 01/03/2013 1331   BUN 6 11/04/2012 0637   CREATININE 0.7 01/03/2013 1331   CREATININE 0.59 11/04/2012 0637      Component Value Date/Time   CALCIUM 9.5 01/03/2013 1331   CALCIUM 8.4 11/04/2012 0637   ALKPHOS 129 01/03/2013 1331   AST 16 01/03/2013 1331   ALT 12 01/03/2013 1331   BILITOT 0.22 01/03/2013 1331       RADIOGRAPHIC STUDIES:  Dg Chest Port 1 View  12/08/2012  *RADIOLOGY REPORT*  Clinical Data: Post line placement.  Rule out pneumothorax.  The  PORTABLE CHEST - 1 VIEW  Comparison: 11/25/2012 PET CT.  10/26/2012 chest x-ray.  Findings: Curvilinear appearance right apex along the undersurface of the posterior aspect of the  right second rib probably related to rib rather than pneumothorax.   This can be assessed on follow-up examination if there are any progressive symptoms to suggest pneumothorax.  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  Cardiomegaly.  Mild central pulmonary vascular prominence.  Prior left breast surgery and left axillary lymph node dissection.  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  No definitive pneumothorax.  Curvilinear structure right lung apex probably associated with the undersurface of rib as noted above.  This is a call report.   Original Report Authenticated By: Lacy Duverney, M.D.    Dg C-arm 1-60 Min-no Report  12/08/2012  CLINICAL DATA: port-a-cath insertion   C-ARM 1-60 MINUTES  Fluoroscopy was utilized by the requesting physician.  No radiographic  interpretation.      ASSESSMENT: 47 year old female with   #1stage III a( T3 N1) invasive ductal carcinoma she is status post mastectomy with axillary lymph node dissection with one of 12 lymph nodes positive for metastatic  disease. Tumor was ER negative PR negative HER-2/neu negative. She had her mastectomy on 11/03/2012 with axillary lymph node dissection performed on 12/08/2012 and she had a Port-A-Cath placed at that time as well. He should have no evidence of metastatic disease elsewhere distally.  #2 patient is a good candidate for adjuvant chemotherapy. She will proceed with FEC every 2 weeks with day 2 Neulasta for 4 cycles. After that is completed then she will receive Taxol and carboplatinum on a weekly basis for 12 weeks. I discussed the rationale for this.  #3 patient does need echocardiogram and I will get this scheduled. She already has had chemotherapy teaching class.My hope is to get her started on chemotherapy on  /121/2014.  #4 at some point I will also refer her back to the genetic counselor for genetic testing   PLAN:   #1 overall patient did well with her cycle 1 of FEC. We discussed anti-emetic usage.  #2 she will return on 01/24/2013 for cycle #2 of FEC.  All questions were answered. The patient knows to call the clinic with any problems, questions or concerns. We can certainly see the patient much sooner if necessary.  I spent 25 minutes counseling the patient face to face. The total time spent in the appointment was 30 minutes.    Drue Second, MD Medical/Oncology [facility] [phone] (beeper) [phone] (Office)

## 2013-01-11 ENCOUNTER — Ambulatory Visit (HOSPITAL_BASED_OUTPATIENT_CLINIC_OR_DEPARTMENT_OTHER): Payer: 59

## 2013-01-11 ENCOUNTER — Telehealth: Payer: Self-pay | Admitting: *Deleted

## 2013-01-11 VITALS — BP 156/87 | HR 107 | Temp 98.4°F

## 2013-01-11 DIAGNOSIS — Z5189 Encounter for other specified aftercare: Secondary | ICD-10-CM

## 2013-01-11 DIAGNOSIS — C50419 Malignant neoplasm of upper-outer quadrant of unspecified female breast: Secondary | ICD-10-CM

## 2013-01-11 MED ORDER — PEGFILGRASTIM INJECTION 6 MG/0.6ML
6.0000 mg | Freq: Once | SUBCUTANEOUS | Status: AC
  Administered 2013-01-11: 6 mg via SUBCUTANEOUS
  Filled 2013-01-11: qty 0.6

## 2013-01-11 NOTE — Telephone Encounter (Signed)
Patient here for Neulasta injection following 1st [facility] chemotherapy treatment.  States that she is doing well.  No nausea, vomiting, or diarrhea.  Did have some heartburn last night and this morning.  Is using Tums and she has had this in the past, so she know what to do for it.  Knows to call the clinic if she has any problems or concerns.

## 2013-01-12 ENCOUNTER — Encounter: Payer: Self-pay | Admitting: Oncology

## 2013-01-12 NOTE — Progress Notes (Signed)
Faxed disability form to [employer] @ [phone]; put original in registration desk.

## 2013-01-17 ENCOUNTER — Encounter: Payer: Self-pay | Admitting: Adult Health

## 2013-01-17 ENCOUNTER — Ambulatory Visit: Payer: 59

## 2013-01-17 ENCOUNTER — Ambulatory Visit (HOSPITAL_BASED_OUTPATIENT_CLINIC_OR_DEPARTMENT_OTHER): Payer: 59 | Admitting: Adult Health

## 2013-01-17 ENCOUNTER — Other Ambulatory Visit (HOSPITAL_BASED_OUTPATIENT_CLINIC_OR_DEPARTMENT_OTHER): Payer: 59 | Admitting: Lab

## 2013-01-17 VITALS — BP 145/86 | HR 85 | Temp 98.0°F | Resp 20 | Ht 64.0 in | Wt 211.4 lb

## 2013-01-17 DIAGNOSIS — Z9010 Acquired absence of unspecified breast and nipple: Secondary | ICD-10-CM

## 2013-01-17 DIAGNOSIS — Z171 Estrogen receptor negative status [ER-]: Secondary | ICD-10-CM

## 2013-01-17 DIAGNOSIS — C50919 Malignant neoplasm of unspecified site of unspecified female breast: Secondary | ICD-10-CM

## 2013-01-17 DIAGNOSIS — C50419 Malignant neoplasm of upper-outer quadrant of unspecified female breast: Secondary | ICD-10-CM

## 2013-01-17 LAB — CBC WITH DIFFERENTIAL/PLATELET
BASO%: 1 % (ref 0.0–2.0)
Basophils Absolute: 0 10*3/uL (ref 0.0–0.1)
EOS%: 5.8 % (ref 0.0–7.0)
MCH: 28.4 pg (ref 25.1–34.0)
MCHC: 32.1 g/dL (ref 31.5–36.0)
MCV: 88.6 fL (ref 79.5–101.0)
MONO%: 3.7 % (ref 0.0–14.0)
RBC: 3.87 10*6/uL (ref 3.70–5.45)
RDW: 24 % — ABNORMAL HIGH (ref 11.2–14.5)
nRBC: 0 % (ref 0–0)

## 2013-01-17 LAB — COMPREHENSIVE METABOLIC PANEL (CC13)
AST: 11 U/L (ref 5–34)
Alkaline Phosphatase: 133 U/L (ref 40–150)
Glucose: 97 mg/dl (ref 70–99)
Sodium: 140 mEq/L (ref 136–145)
Total Bilirubin: <0.2 mg/dL (ref 0.20–1.20)
Total Protein: 6.5 g/dL (ref 6.4–8.3)

## 2013-01-17 NOTE — Progress Notes (Signed)
OFFICE PROGRESS NOTE  CC Dr. Ovidio Kin Dr. 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 Pierce Crane Dr. Ovidio Kin and Dr. Lurline Hare.Patient originally felt lumpiness in her left breast. This prompted a mammogram.Her mammogram was performed on 08/25/2012 showed pleomorphic calcifications of the entire upper outer left breast measuring 11 x 12 x 15 cm. On exam she had a for a multinodular mass like area over the left upper quadrant. Ultrasound confirmed multiple ill-defined hypoechoic areas. Ultrasound of the left axilla showed a few abnormal appearing lymph nodes. Biopsy of the lymph nodes and the breast was performed on 09/01/2012. 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 The ER was 2% PR 0%.   #2Patient was recommended A mastectomy. 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 Lymphovascular invasion was identified all resection margins were negative the 2 sentinel nodes were removed one was positive for metastatic disease. Patient then went on to have a full axillary lymph node dissection performed the pathology showed 10 lymph nodes negative for metastatic disease therefore 1 of 12 lymph nodes were positive. Final pathologic staging was T T3 N1. Tumor was ER negative PR negative HER-2/neu negative.  #3 patient did have genetic counseling performed but she declined genetic testing at that time. We discussed genetic counseling and testing again in patient is now agreeable and we will get this taken care of.  #4 patient was seen by  Dr. Pierce Crane on 11/17/2012 he recommended PET scan which was negative for metastatic disease. He also recommended adjuvant chemotherapy consisting of FEC given every 2 weeks for a total of 4 cycles. I have recommended Weekly Taxol and carboplatinum For a total of 12 weeks after completion of FEC.  CURRENT THERAPY:Patient will proceed with cycle 1 day 8 of FEC  INTERVAL HISTORY: LANA FLAIM 47 y.o. female returns for Followup after her first cycle of FEC.  She is doing well.  She has tolerated the chemotherapy very well and her labs are stable.  She denies fevers, chills, nausea, vomiting or any other concerns.  A 10 point ROS is neg.   MEDICAL HISTORY: Past Medical History  Diagnosis Date  . Coughing     cold recent   . Breast cancer     left, Sept 20 2013    ALLERGIES:   has no known allergies.  MEDICATIONS:  Current Outpatient Prescriptions  Medication Sig Dispense Refill  . dexamethasone (DECADRON) 4 MG tablet Take 2 tablets by mouth once a day on the day after chemotherapy and then take 2 tablets two times a day for 2 days. Take with food.  30 tablet  1  . diphenhydrAMINE (SOMINEX) 25 MG tablet Take 50 mg by mouth 2 (two) times daily as needed. For itching      . HYDROcodone-acetaminophen (NORCO/VICODIN) 5-325 MG per tablet Take 1-2 tablets by mouth every 6 (six) hours as needed for pain.  30 tablet  1  . lidocaine-prilocaine (EMLA) cream Apply topically as needed.  30 g  6  . LORazepam (ATIVAN) 0.5 MG tablet Take 1 tablet (0.5  mg total) by mouth every 6 (six) hours as needed (Nausea or vomiting).  30 tablet  0  . ondansetron (ZOFRAN) 8 MG tablet Take 1 tablet (8 mg total) by mouth 2 (two) times daily as needed. Take two times a day as needed for nausea or vomiting starting on the third day after chemotherapy.  30 tablet  1  . prochlorperazine (COMPAZINE) 10 MG tablet Take 1 tablet (10 mg total) by mouth every 6 (six) hours as needed (Nausea or vomiting).  30 tablet  1  .  prochlorperazine (COMPAZINE) 25 MG suppository Place 1 suppository (25 mg total) rectally every 12 (twelve) hours as needed for nausea.  12 suppository  3    SURGICAL HISTORY:  Past Surgical History  Procedure Date  . Simple mastectomy w/ sentinel node biopsy 11/03/2012  . Simple mastectomy with axillary sentinel node biopsy 11/03/2012    Procedure: SIMPLE MASTECTOMY WITH AXILLARY SENTINEL NODE BIOPSY;  Surgeon: Kandis Cocking, MD;  Location: [facility] OR;  Service: General;  Laterality: Left;  . Portacath placement 12/08/2012    Procedure: INSERTION PORT-A-CATH;  Surgeon: Kandis Cocking, MD;  Location: [facility];  Service: General;  Laterality: N/A;  Power Port Placment and Left Axillary Node Dissection  . Axillary lymph node dissection 12/08/2012    Procedure: AXILLARY LYMPH NODE DISSECTION;  Surgeon: Kandis Cocking, MD;  Location: [facility];  Service: General;  Laterality: Left;    REVIEW OF SYSTEMS:  General: fatigue (-), night sweats (-), fever (-), pain (-) Lymph: palpable nodes (-) HEENT: vision changes (-), mucositis (-), gum bleeding (-), epistaxis (-) Cardiovascular: chest pain (-), palpitations (-) Pulmonary: shortness of breath (-), dyspnea on exertion (-), cough (-), hemoptysis (-) GI:  Early satiety (-), melena (-), dysphagia (-), nausea/vomiting (-), diarrhea (-) GU: dysuria (-), hematuria (-), incontinence (-) Musculoskeletal: joint swelling (-), joint pain (-), back pain (-) Neuro: weakness (-), numbness (-), headache (-), confusion (-) Skin: Rash (-), lesions (-), dryness (-) Psych: depression (-), suicidal/homicidal ideation (-), feeling of hopelessness (-)  PHYSICAL EXAMINATION: Blood pressure 145/86, pulse 85, temperature 98 F (36.7 C), temperature source Oral, resp. rate 20, height 5\' 4"  (1.626 m), weight 211 lb 6.4 oz (95.89 kg). Body mass index is 36.29 kg/(m^2). General: Patient is a well appearing female in no acute distress HEENT: PERRLA, sclerae anicteric no  conjunctival pallor, MMM Neck: supple, no palpable adenopathy Lungs: clear to auscultation bilaterally, no wheezes, rhonchi, or rales Cardiovascular: regular rate rhythm, S1, S2, no murmurs, rubs or gallops Abdomen: Soft, non-tender, non-distended, normoactive bowel sounds, no HSM Extremities: warm and well perfused, no clubbing, cyanosis, or edema Skin: No rashes or lesions Neuro: Non-focal ECOG PERFORMANCE STATUS: 0 - Asymptomatic   LABORATORY DATA: Lab Results  Component Value Date   WBC 3.8* 01/17/2013   HGB 11.0* 01/17/2013   HCT 34.3* 01/17/2013   MCV 88.6 01/17/2013   PLT 182 01/17/2013      Chemistry      Component Value Date/Time   NA 140 01/17/2013 1317   NA 136 11/04/2012 0637   K 4.2 01/17/2013 1317   K 3.5 11/04/2012 0637   CL 106 01/17/2013 1317   CL 103 11/04/2012 0637   CO2 24 01/17/2013 1317   CO2 22 11/04/2012 0637   BUN 9.0 01/17/2013 1317   BUN 6 11/04/2012 0637   CREATININE 0.7 01/17/2013 1317   CREATININE 0.59 11/04/2012 0637      Component Value Date/Time   CALCIUM 8.6 01/17/2013  1317   CALCIUM 8.4 11/04/2012 0637   ALKPHOS 133 01/17/2013 1317   AST 11 01/17/2013 1317   ALT 8 01/17/2013 1317   BILITOT <0.20 Repeated and Verified 01/17/2013 1317       RADIOGRAPHIC STUDIES:  Dg Chest Port 1 View  12/08/2012  *RADIOLOGY REPORT*  Clinical Data: Post line placement.  Rule out pneumothorax.  The  PORTABLE CHEST - 1 VIEW  Comparison: 11/25/2012 PET CT.  10/26/2012 chest x-ray.  Findings: Curvilinear appearance right apex along the undersurface of the posterior aspect of the right second rib probably related to rib rather than pneumothorax.   This can be assessed on follow-up examination if there are any progressive symptoms to suggest pneumothorax.  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  Cardiomegaly.  Mild central pulmonary vascular prominence.  Prior left breast surgery and left axillary lymph node dissection.  IMPRESSION: Right central line tip distal superior vena  cava.  No definitive pneumothorax.  Curvilinear structure right lung apex probably associated with the undersurface of rib as noted above.  This is a call report.   Original Report Authenticated By: Lacy Duverney, M.D.    Dg C-arm 1-60 Min-no Report  12/08/2012  CLINICAL DATA: port-a-cath insertion   C-ARM 1-60 MINUTES  Fluoroscopy was utilized by the requesting physician.  No radiographic  interpretation.      ASSESSMENT: 47 year old female with   #1stage III a( T3 N1) invasive ductal carcinoma she is status post mastectomy with axillary lymph node dissection with one of 12 lymph nodes positive for metastatic disease. Tumor was ER negative PR negative HER-2/neu negative. She had her mastectomy on 11/03/2012 with axillary lymph node dissection performed on 12/08/2012 and she had a Port-A-Cath placed at that time as well. He should have no evidence of metastatic disease elsewhere distally.  #2 patient is a good candidate for adjuvant chemotherapy. She will proceed with FEC every 2 weeks with day 2 Neulasta for 4 cycles. After that is completed then she will receive Taxol and carboplatinum on a weekly basis for 12 weeks. I discussed the rationale for this.  #3 patient does need echocardiogram and I will get this scheduled. She already has had chemotherapy teaching class.  #4 at some point I will also refer her back to the genetic counselor for genetic testing-she declined this at her previous appt.    PLAN:   #1 Ms Weilbacher has tolerated her chemotherapy well.  She knows to call with any problems.  Her labs are stable.    #2 I will see her back next week for cycle 2 of FEC.   All questions were answered. The patient knows to call the clinic with any problems, questions or concerns. We can certainly see the patient much sooner if necessary.  I spent 15 minutes counseling the patient face to face. The total time spent in the appointment was 30 minutes.  Cherie Ouch Lyn Hollingshead, NP Medical  Oncology [facility] Phone: [phone]

## 2013-01-17 NOTE — Patient Instructions (Addendum)
Doing well.  Labs look good.  We will see you next week for your chemo.  Please call us if you have any questions or concerns.

## 2013-01-18 ENCOUNTER — Encounter: Payer: Self-pay | Admitting: Oncology

## 2013-01-18 ENCOUNTER — Ambulatory Visit: Payer: 59

## 2013-01-18 NOTE — Progress Notes (Signed)
Patient left a message. She wants a bill at her job paid with [employer]. I called her back to advise that can not be paid. I am unable to leave her a message at [phone].

## 2013-01-19 ENCOUNTER — Encounter (INDEPENDENT_AMBULATORY_CARE_PROVIDER_SITE_OTHER): Payer: Self-pay | Admitting: Surgery

## 2013-01-19 ENCOUNTER — Encounter (INDEPENDENT_AMBULATORY_CARE_PROVIDER_SITE_OTHER): Payer: 59 | Admitting: Surgery

## 2013-01-19 ENCOUNTER — Other Ambulatory Visit (INDEPENDENT_AMBULATORY_CARE_PROVIDER_SITE_OTHER): Payer: Self-pay

## 2013-01-19 ENCOUNTER — Ambulatory Visit (INDEPENDENT_AMBULATORY_CARE_PROVIDER_SITE_OTHER): Payer: 59 | Admitting: Surgery

## 2013-01-19 VITALS — BP 120/78 | HR 92 | Temp 98.3°F | Resp 18 | Ht 64.0 in | Wt 211.8 lb

## 2013-01-19 DIAGNOSIS — C50419 Malignant neoplasm of upper-outer quadrant of unspecified female breast: Secondary | ICD-10-CM

## 2013-01-19 NOTE — Progress Notes (Signed)
Re:   Hannah Hale DOB:   May 17, 1966 MRN:   [id number]  [facility]  ASSESSMENT AND PLAN: 1.  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  LVI identified.  1/2 lymph nodes involved.  T3, N1., ER - neg, PR - neg., Ki67 - 30% (Stage IIIA)  Saw Hannah Hale (did see Dr. Rubin)/Dr. Michell Hale.  Left completion axillary node dissection/right subclavian power port - 12/08/2012  Final path - 0/10 nodes (so final total was 1/12 nodes)   She is now on her chemotx - FEC, then Taxol/carboplantinum.  I will see her back in 3 months (because of the left arm lymphedema)  1a. 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  She has some upper arm tenderness, but no redness.  Though I do not think that an infection is the source of this, I will empirically give her 1 week of Kelex.  I have referred her to the lymphedema clinic.  2.  Smokes cigarettes - 1/2 pack per day   3.  Chronic anemia.  REFERRING PHYSICIAN: Provider Not In Hale  HISTORY OF PRESENT ILLNESS: Hannah Hale is a 47 y.o. (DOB: 06/16/1966)  AA female whose primary care physician is Hannah Hale and through the [facility] for left breast cancer.  She sees Dr. Mitchel Hale at [facility].  Her mother is with her.  She comes back for a check of her left chest/axilla.  And actually things look good.  Unfortunately, she has developed swelling of her left arm.  She complains of some mild tenderness in the upper left arm.  She's had no fever.  History of left breast cancer: Ms. Hale felt a lumpiness in her left breast recently.  This prompted a mammogram.  She has had no prior breast problems or surgery.  This was her first mammogram. She still is having regular periods. LMP was 3 weeks.  Has no children.  No family history of breast cancer. MRI - 09/08/2012.  The abnormal area in total measures 17.6(AP) x 8.8 (trv) x 9.4 (CC) cm.   Past Medical History  Diagnosis Date  . Coughing     cold recent   .  Breast cancer     left, Sept 20 2013     Current Outpatient Prescriptions  Medication Sig Dispense Refill  . dexamethasone (DECADRON) 4 MG tablet Take 2 tablets by mouth once a day on the day after chemotherapy and then take 2 tablets two times a day for 2 days. Take with food.  30 tablet  1  . diphenhydrAMINE (SOMINEX) 25 MG tablet Take 50 mg by mouth 2 (two) times daily as needed. For itching      . HYDROcodone-acetaminophen (NORCO/VICODIN) 5-325 MG per tablet Take 1-2 tablets by mouth every 6 (six) hours as needed for pain.  30 tablet  1  . lidocaine-prilocaine (EMLA) cream Apply topically as needed.  30 g  6  . LORazepam (ATIVAN) 0.5 MG tablet Take 1 tablet (0.5 mg total) by mouth every 6 (six) hours as needed (Nausea or vomiting).  30 tablet  0  . ondansetron (ZOFRAN) 8 MG tablet Take 1 tablet (8 mg total) by mouth 2 (two) times daily as needed. Take two times a day as needed for nausea or vomiting starting on the third day after chemotherapy.  30 tablet  1  . prochlorperazine (COMPAZINE) 10 MG tablet Take 1 tablet (10 mg total) by mouth every 6 (six) hours as needed (  Nausea or vomiting).  30 tablet  1  . prochlorperazine (COMPAZINE) 25 MG suppository Place 1 suppository (25 mg total) rectally every 12 (twelve) hours as needed for nausea.  12 suppository  3    No Known Allergies  REVIEW OF SYSTEMS: Pulmonary:  Smokes 1/2 ppd. She knows this is bad for her health.  SOCIAL and FAMILY HISTORY: Married.   She works at [employer] as a Scientist, clinical (histocompatibility and immunogenetics). She has a sister, Hannah Hale.  PHYSICAL EXAM: BP 120/78  Pulse 92  Temp 98.3 F (36.8 C) (Temporal)  Resp 18  Ht 5\' 4"  (1.626 m)  Wt 211 lb 12.8 oz (96.072 kg)  BMI 36.36 kg/m2   General: AA F who is alert and generally healthy appearing.  Breasts:  Left:  Absent.  Wound looks good.  Her left axilla looks much better than when I last saw her.  Right:  No mass.. Chest:  Right subclavian power port. Extremities:  Soreness in the upper left  arm with some moderate left lymphedema.  DATA REVIEWED: No new data.  Hannah Kin, MD,  [facility] [address] [place]  [address]   [place], [place]    [address] Phone:  [phone] FAX:  [phone]

## 2013-01-20 ENCOUNTER — Encounter (INDEPENDENT_AMBULATORY_CARE_PROVIDER_SITE_OTHER): Payer: 59 | Admitting: Surgery

## 2013-01-24 ENCOUNTER — Ambulatory Visit (HOSPITAL_BASED_OUTPATIENT_CLINIC_OR_DEPARTMENT_OTHER): Payer: 59

## 2013-01-24 ENCOUNTER — Encounter: Payer: Self-pay | Admitting: Adult Health

## 2013-01-24 ENCOUNTER — Ambulatory Visit (HOSPITAL_BASED_OUTPATIENT_CLINIC_OR_DEPARTMENT_OTHER): Payer: 59 | Admitting: Adult Health

## 2013-01-24 ENCOUNTER — Other Ambulatory Visit (HOSPITAL_BASED_OUTPATIENT_CLINIC_OR_DEPARTMENT_OTHER): Payer: 59 | Admitting: Lab

## 2013-01-24 ENCOUNTER — Encounter: Payer: Self-pay | Admitting: Oncology

## 2013-01-24 VITALS — BP 147/81 | HR 80 | Temp 98.5°F | Resp 20 | Ht 64.0 in | Wt 211.3 lb

## 2013-01-24 DIAGNOSIS — C50919 Malignant neoplasm of unspecified site of unspecified female breast: Secondary | ICD-10-CM

## 2013-01-24 DIAGNOSIS — C50419 Malignant neoplasm of upper-outer quadrant of unspecified female breast: Secondary | ICD-10-CM

## 2013-01-24 DIAGNOSIS — Z5111 Encounter for antineoplastic chemotherapy: Secondary | ICD-10-CM

## 2013-01-24 DIAGNOSIS — Z171 Estrogen receptor negative status [ER-]: Secondary | ICD-10-CM

## 2013-01-24 LAB — CBC WITH DIFFERENTIAL/PLATELET
Basophils Absolute: 0.1 10*3/uL (ref 0.0–0.1)
Eosinophils Absolute: 0.1 10*3/uL (ref 0.0–0.5)
LYMPH%: 22.7 % (ref 14.0–49.7)
MCV: 89.1 fL (ref 79.5–101.0)
MONO%: 7.1 % (ref 0.0–14.0)
NEUT#: 7 10*3/uL — ABNORMAL HIGH (ref 1.5–6.5)
Platelets: 231 10*3/uL (ref 145–400)
RBC: 3.84 10*6/uL (ref 3.70–5.45)

## 2013-01-24 LAB — COMPREHENSIVE METABOLIC PANEL (CC13)
Alkaline Phosphatase: 144 U/L (ref 40–150)
BUN: 9.2 mg/dL (ref 7.0–26.0)
Glucose: 119 mg/dl — ABNORMAL HIGH (ref 70–99)
Sodium: 139 mEq/L (ref 136–145)
Total Bilirubin: <0.2 mg/dL (ref 0.20–1.20)

## 2013-01-24 MED ORDER — SODIUM CHLORIDE 0.9 % IV SOLN
500.0000 mg/m2 | Freq: Once | INTRAVENOUS | Status: AC
  Administered 2013-01-24: 1040 mg via INTRAVENOUS
  Filled 2013-01-24: qty 52

## 2013-01-24 MED ORDER — DEXAMETHASONE SODIUM PHOSPHATE 4 MG/ML IJ SOLN
12.0000 mg | Freq: Once | INTRAMUSCULAR | Status: AC
  Administered 2013-01-24: 12 mg via INTRAVENOUS

## 2013-01-24 MED ORDER — PALONOSETRON HCL INJECTION 0.25 MG/5ML
0.2500 mg | Freq: Once | INTRAVENOUS | Status: AC
  Administered 2013-01-24: 0.25 mg via INTRAVENOUS

## 2013-01-24 MED ORDER — HEPARIN SOD (PORK) LOCK FLUSH 100 UNIT/ML IV SOLN
500.0000 [IU] | Freq: Once | INTRAVENOUS | Status: AC | PRN
  Administered 2013-01-24: 500 [IU]
  Filled 2013-01-24: qty 5

## 2013-01-24 MED ORDER — EPIRUBICIN HCL CHEMO IV INJECTION 200 MG/100ML
100.0000 mg/m2 | Freq: Once | INTRAVENOUS | Status: AC
  Administered 2013-01-24: 206 mg via INTRAVENOUS
  Filled 2013-01-24: qty 103

## 2013-01-24 MED ORDER — FLUOROURACIL CHEMO INJECTION 2.5 GM/50ML
500.0000 mg/m2 | Freq: Once | INTRAVENOUS | Status: AC
  Administered 2013-01-24: 1050 mg via INTRAVENOUS
  Filled 2013-01-24: qty 21

## 2013-01-24 MED ORDER — LORAZEPAM 2 MG/ML IJ SOLN
0.5000 mg | Freq: Once | INTRAMUSCULAR | Status: AC
  Administered 2013-01-24: 0.5 mg via INTRAVENOUS

## 2013-01-24 MED ORDER — SODIUM CHLORIDE 0.9 % IV SOLN
150.0000 mg | Freq: Once | INTRAVENOUS | Status: AC
  Administered 2013-01-24: 150 mg via INTRAVENOUS
  Filled 2013-01-24: qty 5

## 2013-01-24 MED ORDER — SODIUM CHLORIDE 0.9 % IV SOLN
Freq: Once | INTRAVENOUS | Status: AC
  Administered 2013-01-24: 15:00:00 via INTRAVENOUS

## 2013-01-24 MED ORDER — SODIUM CHLORIDE 0.9 % IJ SOLN
10.0000 mL | INTRAMUSCULAR | Status: DC | PRN
  Administered 2013-01-24: 10 mL
  Filled 2013-01-24: qty 10

## 2013-01-24 NOTE — Progress Notes (Signed)
Patient came in and gave PNG bill to be paid 370.97. I will forward to Tami. I told her it would come from [employer] of [phone] would be balance. She said she was ok with using and the balance.

## 2013-01-24 NOTE — Patient Instructions (Addendum)
Doing well.  Labs look good, proceed with chemotherapy.  Please call us if you have any questions or concerns.     [phone] in case you need to delay your neulasta appointment to Thursday.

## 2013-01-24 NOTE — Progress Notes (Signed)
OFFICE PROGRESS NOTE  CC Dr. Ovidio Kin Dr. 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 Pierce Crane Dr. Ovidio Kin and Dr. Lurline Hare.Patient originally felt lumpiness in her left breast. This prompted a mammogram.Her mammogram was performed on 08/25/2012 showed pleomorphic calcifications of the entire upper outer left breast measuring 11 x 12 x 15 cm. On exam she had a for a multinodular mass like area over the left upper quadrant. Ultrasound confirmed multiple ill-defined hypoechoic areas. Ultrasound of the left axilla showed a few abnormal appearing lymph nodes. Biopsy of the lymph nodes and the breast was performed on 09/01/2012. 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 The ER was 2% PR 0%.   #2Patient was recommended A mastectomy. 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 Lymphovascular invasion was identified all resection margins were negative the 2 sentinel nodes were removed one was positive for metastatic disease. Patient then went on to have a full axillary lymph node dissection performed the pathology showed 10 lymph nodes negative for metastatic disease therefore 1 of 12 lymph nodes were positive. Final pathologic staging was T T3 N1. Tumor was ER negative PR negative HER-2/neu negative.  #3 patient did have genetic counseling performed but she declined genetic testing at that time. We discussed genetic counseling and testing again in patient is now agreeable and we will get this taken care of.  #4 patient was seen by  Dr. Pierce Crane on 11/17/2012 he recommended PET scan which was negative for metastatic disease. He also recommended adjuvant chemotherapy consisting of FEC given every 2 weeks for a total of 4 cycles. I have recommended Weekly Taxol and carboplatinum For a total of 12 weeks after completion of FEC.  CURRENT THERAPY:Patient will proceed with cycle 2 day 1 of FEC  INTERVAL HISTORY: Hannah Hale 47 y.o. female returns for evaluation for her second cycle of FEC.  She is doing well.  She denies fevers, chills, pain, or any other concerns. A 10 point ROS is neg.  MEDICAL HISTORY: Past Medical History  Diagnosis Date  . Coughing     cold recent   . Breast cancer     left, Sept 20 2013    ALLERGIES:  has No Known Allergies.  MEDICATIONS:  Current Outpatient Prescriptions  Medication Sig Dispense Refill  . dexamethasone (DECADRON) 4 MG tablet Take 2 tablets by mouth once a day on the day after chemotherapy and then take 2 tablets two times a day for 2 days. Take with food.  30 tablet  1  . diphenhydrAMINE (SOMINEX) 25 MG tablet Take 50 mg by mouth 2 (two) times daily as needed. For itching      . HYDROcodone-acetaminophen (NORCO/VICODIN) 5-325 MG per tablet Take 1-2 tablets by mouth every 6 (six) hours as needed for pain.  30 tablet  1  . lidocaine-prilocaine (EMLA) cream Apply topically as needed.  30 g  6  . LORazepam (ATIVAN) 0.5 MG tablet Take 1 tablet (0.5 mg total) by mouth every 6 (six) hours as needed (Nausea or vomiting).  30 tablet  0  .  ondansetron (ZOFRAN) 8 MG tablet Take 1 tablet (8 mg total) by mouth 2 (two) times daily as needed. Take two times a day as needed for nausea or vomiting starting on the third day after chemotherapy.  30 tablet  1  . prochlorperazine (COMPAZINE) 10 MG tablet Take 1 tablet (10 mg total) by mouth every 6 (six) hours as needed (Nausea or vomiting).  30 tablet  1  . prochlorperazine (COMPAZINE) 25 MG suppository Place 1 suppository (25 mg total) rectally  every 12 (twelve) hours as needed for nausea.  12 suppository  3   No current facility-administered medications for this visit.   Facility-Administered Medications Ordered in Other Visits  Medication Dose Route Frequency Provider Last Rate Last Dose  . cyclophosphamide (CYTOXAN) 1,040 mg in sodium chloride 0.9 % 250 mL chemo infusion  500 mg/m2 (Treatment Plan Actual) Intravenous Once Victorino December, MD      . epirubicin [facility]) chemo injection 206 mg  100 mg/m2 (Treatment Plan Actual) Intravenous Once Victorino December, MD      . fluorouracil (ADRUCIL) chemo injection 1,050 mg  500 mg/m2 (Treatment Plan Actual) Intravenous Once Victorino December, MD      . fosaprepitant (EMEND) 150 mg in sodium chloride 0.9 % 145 mL IVPB  150 mg Intravenous Once Victorino December, MD 300 mL/hr at 01/24/13 1536 150 mg at 01/24/13 1536  . heparin lock flush 100 unit/mL  500 Units Intracatheter Once PRN Victorino December, MD      . sodium chloride 0.9 % injection 10 mL  10 mL Intracatheter PRN Victorino December, MD        SURGICAL HISTORY:  Past Surgical History  Procedure Laterality Date  . Simple mastectomy w/ sentinel node biopsy  11/03/2012  . Simple mastectomy with axillary sentinel node biopsy  11/03/2012    Procedure: SIMPLE MASTECTOMY WITH AXILLARY SENTINEL NODE BIOPSY;  Surgeon: Kandis Cocking, MD;  Location: [facility] OR;  Service: General;  Laterality: Left;  . Portacath placement  12/08/2012    Procedure: INSERTION PORT-A-CATH;  Surgeon: Kandis Cocking, MD;  Location: [facility];  Service: General;  Laterality: N/A;  Power Port Placment and Left Axillary Node Dissection  . Axillary lymph node dissection  12/08/2012    Procedure: AXILLARY LYMPH NODE DISSECTION;  Surgeon: Kandis Cocking, MD;  Location: [facility] OR[facility];  Service: General;  Laterality: Left;    REVIEW OF SYSTEMS:  General: fatigue (-), night sweats (-), fever (-), pain (-) Lymph: palpable nodes (-) HEENT: vision changes (-), mucositis (-), gum bleeding (-),  epistaxis (-) Cardiovascular: chest pain (-), palpitations (-) Pulmonary: shortness of breath (-), dyspnea on exertion (-), cough (-), hemoptysis (-) GI:  Early satiety (-), melena (-), dysphagia (-), nausea/vomiting (-), diarrhea (-) GU: dysuria (-), hematuria (-), incontinence (-) Musculoskeletal: joint swelling (-), joint pain (-), back pain (-) Neuro: weakness (-), numbness (-), headache (-), confusion (-) Skin: Rash (-), lesions (-), dryness (-) Psych: depression (-), suicidal/homicidal ideation (-), feeling of hopelessness (-)  PHYSICAL EXAMINATION: Blood pressure 147/81, pulse 80, temperature 98.5 F (36.9 C), temperature source Oral, resp. rate 20, height 5\' 4"  (1.626 m), weight 211 lb 4.8 oz (95.845 kg). Body mass index is 36.25 kg/(m^2). General: Patient is a well appearing female in no acute distress HEENT: PERRLA, sclerae anicteric no conjunctival pallor, MMM Neck: supple, no palpable adenopathy Lungs: clear to auscultation bilaterally, no wheezes, rhonchi, or rales Cardiovascular: regular rate rhythm, S1, S2, no murmurs, rubs or  gallops Abdomen: Soft, non-tender, non-distended, normoactive bowel sounds, no HSM Extremities: warm and well perfused, no clubbing, cyanosis, or edema Skin: No rashes or lesions Neuro: Non-focal ECOG PERFORMANCE STATUS: 0 - Asymptomatic   LABORATORY DATA: Lab Results  Component Value Date   WBC 10.3 01/24/2013   HGB 11.0* 01/24/2013   HCT 34.2* 01/24/2013   MCV 89.1 01/24/2013   PLT 231 01/24/2013      Chemistry      Component Value Date/Time   NA 139 01/24/2013 1321   NA 136 11/04/2012 0637   K 4.2 01/24/2013 1321   K 3.5 11/04/2012 0637   CL 106 01/24/2013 1321   CL 103 11/04/2012 0637   CO2 24 01/24/2013 1321   CO2 22 11/04/2012 0637   BUN 9.2 01/24/2013 1321   BUN 6 11/04/2012 0637   CREATININE 0.8 01/24/2013 1321   CREATININE 0.59 11/04/2012 0637      Component Value Date/Time   CALCIUM 8.7 01/24/2013 1321   CALCIUM 8.4 11/04/2012  0637   ALKPHOS 144 01/24/2013 1321   AST 21 01/24/2013 1321   ALT 16 01/24/2013 1321   BILITOT <0.20 Repeated and Verified 01/24/2013 1321       RADIOGRAPHIC STUDIES:  Dg Chest Port 1 View  12/08/2012  *RADIOLOGY REPORT*  Clinical Data: Post line placement.  Rule out pneumothorax.  The  PORTABLE CHEST - 1 VIEW  Comparison: 11/25/2012 PET CT.  10/26/2012 chest x-ray.  Findings: Curvilinear appearance right apex along the undersurface of the posterior aspect of the right second rib probably related to rib rather than pneumothorax.   This can be assessed on follow-up examination if there are any progressive symptoms to suggest pneumothorax.  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  Cardiomegaly.  Mild central pulmonary vascular prominence.  Prior left breast surgery and left axillary lymph node dissection.  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  No definitive pneumothorax.  Curvilinear structure right lung apex probably associated with the undersurface of rib as noted above.  This is a call report.   Original Report Authenticated By: Lacy Duverney, M.D.    Dg C-arm 1-60 Min-no Report  12/08/2012  CLINICAL DATA: port-a-cath insertion   C-ARM 1-60 MINUTES  Fluoroscopy was utilized by the requesting physician.  No radiographic  interpretation.      ASSESSMENT: 47 year old female with   #1stage III a( T3 N1) invasive ductal carcinoma she is status post mastectomy with axillary lymph node dissection with one of 12 lymph nodes positive for metastatic disease. Tumor was ER negative PR negative HER-2/neu negative. She had her mastectomy on 11/03/2012 with axillary lymph node dissection performed on 12/08/2012 and she had a Port-A-Cath placed at that time as well. He should have no evidence of metastatic disease elsewhere distally.  #2 patient is a good candidate for adjuvant chemotherapy. She will proceed with FEC every 2 weeks with day 2 Neulasta for 4 cycles. After that is  completed then she will receive Taxol and carboplatinum on a weekly basis for 12 weeks. I discussed the rationale for this.  #3 patient does need echocardiogram and I will get this scheduled. She already has had chemotherapy teaching class.  #4 at some point I will also refer her back to the genetic counselor for genetic testing-she declined this at her previous appt.    PLAN:   #1 Ms Kluesner will proceed with chemotherapy today.   #2 I will see her back next week for labs and an  appointment.    All questions were answered. The patient knows to call the clinic with any problems, questions or concerns. We can certainly see the patient much sooner if necessary.  I spent 15 minutes counseling the patient face to face. The total time spent in the appointment was 30 minutes.  Cherie Ouch Lyn Hollingshead, NP Medical Oncology [facility] Phone: [phone]

## 2013-01-24 NOTE — Patient Instructions (Addendum)
[facility] Discharge Instructions for Patients Receiving Chemotherapy  Today you received the following chemotherapy agents epirubicin/ 5FU/ cytoxan  To help prevent nausea and vomiting after your treatment, we encourage you to take your nausea medication  and take it as often as prescribed   If you develop nausea and vomiting that is not controlled by your nausea medication, call the clinic. If it is after clinic hours your family physician or the after hours number for the clinic or go to the Emergency Department.   BELOW ARE SYMPTOMS THAT SHOULD BE REPORTED IMMEDIATELY:  *FEVER GREATER THAN 100.5 F  *CHILLS WITH OR WITHOUT FEVER  NAUSEA AND VOMITING THAT IS NOT CONTROLLED WITH YOUR NAUSEA MEDICATION  *UNUSUAL SHORTNESS OF BREATH  *UNUSUAL BRUISING OR BLEEDING  TENDERNESS IN MOUTH AND THROAT WITH OR WITHOUT PRESENCE OF ULCERS  *URINARY PROBLEMS  *BOWEL PROBLEMS  UNUSUAL RASH Items with * indicate a potential emergency and should be followed up as soon as possible.  One of the nurses will contact you 24 hours after your treatment. Please let the nurse know about any problems that you may have experienced. Feel free to call the clinic you have any questions or concerns. The clinic phone number is [phone].   I have been informed and understand all the instructions given to me. I know to contact the clinic, my physician, or go to the Emergency Department if any problems should occur. I do not have any questions at this time, but understand that I may call the clinic during office hours   should I have any questions or need assistance in obtaining follow up care.    __________________________________________  _____________  __________ Signature of Patient or Authorized Representative            Date                   Time    __________________________________________ Nurse's Signature

## 2013-01-25 ENCOUNTER — Ambulatory Visit (HOSPITAL_BASED_OUTPATIENT_CLINIC_OR_DEPARTMENT_OTHER): Payer: 59

## 2013-01-25 VITALS — BP 144/99 | HR 118 | Temp 98.4°F

## 2013-01-25 DIAGNOSIS — Z5189 Encounter for other specified aftercare: Secondary | ICD-10-CM

## 2013-01-25 DIAGNOSIS — C50419 Malignant neoplasm of upper-outer quadrant of unspecified female breast: Secondary | ICD-10-CM

## 2013-01-25 MED ORDER — PEGFILGRASTIM INJECTION 6 MG/0.6ML
6.0000 mg | Freq: Once | SUBCUTANEOUS | Status: AC
  Administered 2013-01-25: 6 mg via SUBCUTANEOUS
  Filled 2013-01-25: qty 0.6

## 2013-01-26 ENCOUNTER — Ambulatory Visit: Payer: 59 | Admitting: Physical Therapy

## 2013-01-26 ENCOUNTER — Other Ambulatory Visit: Payer: 59 | Admitting: Lab

## 2013-01-26 ENCOUNTER — Encounter: Payer: 59 | Admitting: Genetic Counselor

## 2013-01-31 ENCOUNTER — Ambulatory Visit (HOSPITAL_BASED_OUTPATIENT_CLINIC_OR_DEPARTMENT_OTHER): Payer: 59 | Admitting: Adult Health

## 2013-01-31 ENCOUNTER — Ambulatory Visit: Payer: 59

## 2013-01-31 ENCOUNTER — Encounter: Payer: Self-pay | Admitting: Adult Health

## 2013-01-31 ENCOUNTER — Other Ambulatory Visit (HOSPITAL_BASED_OUTPATIENT_CLINIC_OR_DEPARTMENT_OTHER): Payer: 59 | Admitting: Lab

## 2013-01-31 VITALS — BP 117/75 | HR 92 | Temp 98.3°F | Resp 20 | Ht 64.0 in | Wt 210.8 lb

## 2013-01-31 DIAGNOSIS — Z171 Estrogen receptor negative status [ER-]: Secondary | ICD-10-CM

## 2013-01-31 DIAGNOSIS — C50919 Malignant neoplasm of unspecified site of unspecified female breast: Secondary | ICD-10-CM

## 2013-01-31 DIAGNOSIS — C773 Secondary and unspecified malignant neoplasm of axilla and upper limb lymph nodes: Secondary | ICD-10-CM

## 2013-01-31 DIAGNOSIS — C50419 Malignant neoplasm of upper-outer quadrant of unspecified female breast: Secondary | ICD-10-CM

## 2013-01-31 LAB — COMPREHENSIVE METABOLIC PANEL (CC13)
ALT: 13 U/L (ref 0–55)
AST: 12 U/L (ref 5–34)
Albumin: 3.3 g/dL — ABNORMAL LOW (ref 3.5–5.0)
Alkaline Phosphatase: 167 U/L — ABNORMAL HIGH (ref 40–150)
BUN: 11.4 mg/dL (ref 7.0–26.0)
CO2: 25 mEq/L (ref 22–29)
Calcium: 9.4 mg/dL (ref 8.4–10.4)
Chloride: 108 mEq/L — ABNORMAL HIGH (ref 98–107)
Creatinine: 0.8 mg/dL (ref 0.6–1.1)
Glucose: 122 mg/dl — ABNORMAL HIGH (ref 70–99)
Potassium: 4.1 mEq/L (ref 3.5–5.1)
Sodium: 140 mEq/L (ref 136–145)
Total Bilirubin: <0.2 mg/dL (ref 0.20–1.20)
Total Protein: 6.7 g/dL (ref 6.4–8.3)

## 2013-01-31 LAB — CBC WITH DIFFERENTIAL/PLATELET
Eosinophils Absolute: 0 10*3/uL (ref 0.0–0.5)
LYMPH%: 23.1 % (ref 14.0–49.7)
MCH: 29 pg (ref 25.1–34.0)
MCHC: 33.1 g/dL (ref 31.5–36.0)
MCV: 87.5 fL (ref 79.5–101.0)
MONO%: 5.6 % (ref 0.0–14.0)
NEUT#: 5.1 10*3/uL (ref 1.5–6.5)
Platelets: 278 10*3/uL (ref 145–400)
RBC: 3.65 10*6/uL — ABNORMAL LOW (ref 3.70–5.45)

## 2013-01-31 NOTE — Progress Notes (Signed)
OFFICE PROGRESS NOTE  CC Dr. Ovidio Kin Dr. 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 Pierce Crane Dr. Ovidio Kin and Dr. Lurline Hare.Patient originally felt lumpiness in her left breast. This prompted a mammogram.Her mammogram was performed on 08/25/2012 showed pleomorphic calcifications of the entire upper outer left breast measuring 11 x 12 x 15 cm. On exam she had a for a multinodular mass like area over the left upper quadrant. Ultrasound confirmed multiple ill-defined hypoechoic areas. Ultrasound of the left axilla showed a few abnormal appearing lymph nodes. Biopsy of the lymph nodes and the breast was performed on 09/01/2012. 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 The ER was 2% PR 0%.   #2Patient was recommended A mastectomy. 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 Lymphovascular invasion was identified all resection margins were negative the 2 sentinel nodes were removed one was positive for metastatic disease. Patient then went on to have a full axillary lymph node dissection performed the pathology showed 10 lymph nodes negative for metastatic disease therefore 1 of 12 lymph nodes were positive. Final pathologic staging was T T3 N1. Tumor was ER negative PR negative HER-2/neu negative.  #3 patient did have genetic counseling performed but she declined genetic testing at that time. We discussed genetic counseling and testing again in patient is now agreeable and we will get this taken care of.  #4 patient was seen by  Dr. Pierce Crane on 11/17/2012 he recommended PET scan which was negative for metastatic disease. He also recommended adjuvant chemotherapy consisting of FEC given every 2 weeks for a total of 4 cycles. I have recommended Weekly Taxol and carboplatinum For a total of 12 weeks after completion of FEC.  CURRENT THERAPY:Patient will proceed with cycle 2 day 8 of FEC  INTERVAL HISTORY: Hannah Hale 47 y.o. female returns for evaluation after her second cycle of FEC.  She is doing well.  Her labs are stable.  She is having indigestion that she noticed that was worse on the days she took Decadron.  Otherwise she's feeling well and a 10 point ROS is neg.   MEDICAL HISTORY: Past Medical History  Diagnosis Date  . Coughing     cold recent   . Breast cancer     left, Sept 20 2013    ALLERGIES:  has No Known Allergies.  MEDICATIONS:  Current Outpatient Prescriptions  Medication Sig Dispense Refill  . dexamethasone (DECADRON) 4 MG tablet Take 2 tablets by mouth once a day on the day after chemotherapy and then take 2 tablets two times a day for 2 days. Take with food.  30 tablet  1  . diphenhydrAMINE (SOMINEX) 25 MG tablet Take 50 mg by mouth 2 (two) times daily as needed. For itching      . HYDROcodone-acetaminophen (NORCO/VICODIN) 5-325 MG per tablet Take 1-2 tablets by mouth every 6 (six) hours as needed for pain.  30 tablet  1  . lidocaine-prilocaine (EMLA) cream Apply topically as needed.  30 g  6  . LORazepam (ATIVAN) 0.5 MG tablet Take 1 tablet (  0.5 mg total) by mouth every 6 (six) hours as needed (Nausea or vomiting).  30 tablet  0  . ondansetron (ZOFRAN) 8 MG tablet Take 1 tablet (8 mg total) by mouth 2 (two) times daily as needed. Take two times a day as needed for nausea or vomiting starting on the third day after chemotherapy.  30 tablet  1  . prochlorperazine (COMPAZINE) 10 MG tablet Take 1 tablet (10 mg total) by mouth every 6 (six) hours as needed (Nausea or vomiting).  30 tablet  1   . prochlorperazine (COMPAZINE) 25 MG suppository Place 1 suppository (25 mg total) rectally every 12 (twelve) hours as needed for nausea.  12 suppository  3   No current facility-administered medications for this visit.    SURGICAL HISTORY:  Past Surgical History  Procedure Laterality Date  . Simple mastectomy w/ sentinel node biopsy  11/03/2012  . Simple mastectomy with axillary sentinel node biopsy  11/03/2012    Procedure: SIMPLE MASTECTOMY WITH AXILLARY SENTINEL NODE BIOPSY;  Surgeon: Kandis Cocking, MD;  Location: [facility] OR;  Service: General;  Laterality: Left;  . Portacath placement  12/08/2012    Procedure: INSERTION PORT-A-CATH;  Surgeon: Kandis Cocking, MD;  Location: [facility] OR[facility];  Service: General;  Laterality: N/A;  Power Port Placment and Left Axillary Node Dissection  . Axillary lymph node dissection  12/08/2012    Procedure: AXILLARY LYMPH NODE DISSECTION;  Surgeon: Kandis Cocking, MD;  Location: [facility] OR[facility];  Service: General;  Laterality: Left;    REVIEW OF SYSTEMS:  General: fatigue (-), night sweats (-), fever (-), pain (-) Lymph: palpable nodes (-) HEENT: vision changes (-), mucositis (-), gum bleeding (-), epistaxis (-) Cardiovascular: chest pain (-), palpitations (-) Pulmonary: shortness of breath (-), dyspnea on exertion (-), cough (-), hemoptysis (-) GI:  Early satiety (-), melena (-), dysphagia (-), nausea/vomiting (-), diarrhea (-) GU: dysuria (-), hematuria (-), incontinence (-) Musculoskeletal: joint swelling (-), joint pain (-), back pain (-) Neuro: weakness (-), numbness (-), headache (-), confusion (-) Skin: Rash (-), lesions (-), dryness (-) Psych: depression (-), suicidal/homicidal ideation (-), feeling of hopelessness (-)  PHYSICAL EXAMINATION: Blood pressure 117/75, pulse 92, temperature 98.3 F (36.8 C), temperature source Oral, resp. rate 20, height 5\' 4"  (1.626 m), weight 210 lb 12.8 oz (95.618 kg). Body mass index is 36.17 kg/(m^2). General: Patient is a  well appearing female in no acute distress HEENT: PERRLA, sclerae anicteric no conjunctival pallor, MMM Neck: supple, no palpable adenopathy Lungs: clear to auscultation bilaterally, no wheezes, rhonchi, or rales Cardiovascular: regular rate rhythm, S1, S2, no murmurs, rubs or gallops Abdomen: Soft, non-tender, non-distended, normoactive bowel sounds, no HSM Extremities: warm and well perfused, no clubbing, cyanosis, or edema Skin: No rashes or lesions Neuro: Non-focal ECOG PERFORMANCE STATUS: 0 - Asymptomatic   LABORATORY DATA: Lab Results  Component Value Date   WBC 7.2 01/31/2013   HGB 10.6* 01/31/2013   HCT 31.9* 01/31/2013   MCV 87.5 01/31/2013   PLT 278 01/31/2013      Chemistry      Component Value Date/Time   NA 140 01/31/2013 1305   NA 136 11/04/2012 0637   K 4.1 01/31/2013 1305   K 3.5 11/04/2012 0637   CL 108* 01/31/2013 1305   CL 103 11/04/2012 0637   CO2 25 01/31/2013 1305   CO2 22 11/04/2012 0637   BUN 11.4 01/31/2013 1305   BUN 6 11/04/2012 0637   CREATININE 0.8 01/31/2013 1305   CREATININE 0.59  11/04/2012 0637      Component Value Date/Time   CALCIUM 9.4 01/31/2013 1305   CALCIUM 8.4 11/04/2012 0637   ALKPHOS 167* 01/31/2013 1305   AST 12 01/31/2013 1305   ALT 13 01/31/2013 1305   BILITOT <0.20 Repeated and Verified 01/31/2013 1305       RADIOGRAPHIC STUDIES:  Dg Chest Port 1 View  12/08/2012  *RADIOLOGY REPORT*  Clinical Data: Post line placement.  Rule out pneumothorax.  The  PORTABLE CHEST - 1 VIEW  Comparison: 11/25/2012 PET CT.  10/26/2012 chest x-ray.  Findings: Curvilinear appearance right apex along the undersurface of the posterior aspect of the right second rib probably related to rib rather than pneumothorax.   This can be assessed on follow-up examination if there are any progressive symptoms to suggest pneumothorax.  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  Cardiomegaly.  Mild central pulmonary vascular prominence.  Prior left breast surgery  and left axillary lymph node dissection.  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  No definitive pneumothorax.  Curvilinear structure right lung apex probably associated with the undersurface of rib as noted above.  This is a call report.   Original Report Authenticated By: Lacy Duverney, M.D.    Dg C-arm 1-60 Min-no Report  12/08/2012  CLINICAL DATA: port-a-cath insertion   C-ARM 1-60 MINUTES  Fluoroscopy was utilized by the requesting physician.  No radiographic  interpretation.      ASSESSMENT: 47 year old female with   #1stage III a( T3 N1) invasive ductal carcinoma she is status post mastectomy with axillary lymph node dissection with one of 12 lymph nodes positive for metastatic disease. Tumor was ER negative PR negative HER-2/neu negative. She had her mastectomy on 11/03/2012 with axillary lymph node dissection performed on 12/08/2012 and she had a Port-A-Cath placed at that time as well. He should have no evidence of metastatic disease elsewhere distally.  #2 patient is a good candidate for adjuvant chemotherapy. She will proceed with FEC every 2 weeks with day 2 Neulasta for 4 cycles. After that is completed then she will receive Taxol and carboplatinum on a weekly basis for 12 weeks. I discussed the rationale for this.  #3 patient does need echocardiogram and I will get this scheduled. She already has had chemotherapy teaching class.  #4 at some point I will also refer her back to the genetic counselor for genetic testing-she declined this at her previous appt.    PLAN:   #1 Ms Ahart labs are stable.  I recommended she take Ranitidine/Zantac 150 mg PO daily for her indigestion.    #2 I will see her back next week for labs, appt, chemo.   All questions were answered. The patient knows to call the clinic with any problems, questions or concerns. We can certainly see the patient much sooner if necessary.  I spent 15 minutes counseling the patient face to face.  The total time spent in the appointment was 30 minutes.  Cherie Ouch Lyn Hollingshead, NP Medical Oncology [facility] Phone: [phone]

## 2013-01-31 NOTE — Patient Instructions (Addendum)
Doing well.  Take Zantac/Ranitidine 150mg  every morning for your indigestion. Please call us if you have any questions or concerns.  We will see you back next week for your next cycle of chemo.

## 2013-02-01 ENCOUNTER — Ambulatory Visit: Payer: 59

## 2013-02-01 ENCOUNTER — Ambulatory Visit: Payer: 59 | Attending: Surgery | Admitting: Physical Therapy

## 2013-02-01 DIAGNOSIS — I890 Lymphedema, not elsewhere classified: Secondary | ICD-10-CM | POA: Insufficient documentation

## 2013-02-01 DIAGNOSIS — IMO0001 Reserved for inherently not codable concepts without codable children: Secondary | ICD-10-CM | POA: Insufficient documentation

## 2013-02-01 DIAGNOSIS — M24519 Contracture, unspecified shoulder: Secondary | ICD-10-CM | POA: Insufficient documentation

## 2013-02-02 ENCOUNTER — Ambulatory Visit: Payer: 59 | Admitting: Physical Therapy

## 2013-02-06 ENCOUNTER — Ambulatory Visit: Payer: 59 | Admitting: *Deleted

## 2013-02-07 ENCOUNTER — Other Ambulatory Visit (HOSPITAL_BASED_OUTPATIENT_CLINIC_OR_DEPARTMENT_OTHER): Payer: 59 | Admitting: Lab

## 2013-02-07 ENCOUNTER — Ambulatory Visit (HOSPITAL_BASED_OUTPATIENT_CLINIC_OR_DEPARTMENT_OTHER): Payer: 59

## 2013-02-07 ENCOUNTER — Encounter: Payer: Self-pay | Admitting: Adult Health

## 2013-02-07 ENCOUNTER — Ambulatory Visit (HOSPITAL_BASED_OUTPATIENT_CLINIC_OR_DEPARTMENT_OTHER): Payer: 59 | Admitting: Adult Health

## 2013-02-07 VITALS — BP 140/83 | HR 82 | Temp 98.6°F | Resp 20 | Ht 64.0 in | Wt 213.1 lb

## 2013-02-07 DIAGNOSIS — C50419 Malignant neoplasm of upper-outer quadrant of unspecified female breast: Secondary | ICD-10-CM

## 2013-02-07 DIAGNOSIS — C50919 Malignant neoplasm of unspecified site of unspecified female breast: Secondary | ICD-10-CM

## 2013-02-07 DIAGNOSIS — Z5111 Encounter for antineoplastic chemotherapy: Secondary | ICD-10-CM

## 2013-02-07 DIAGNOSIS — C50412 Malignant neoplasm of upper-outer quadrant of left female breast: Secondary | ICD-10-CM

## 2013-02-07 DIAGNOSIS — C773 Secondary and unspecified malignant neoplasm of axilla and upper limb lymph nodes: Secondary | ICD-10-CM

## 2013-02-07 DIAGNOSIS — Z171 Estrogen receptor negative status [ER-]: Secondary | ICD-10-CM

## 2013-02-07 LAB — CBC WITH DIFFERENTIAL/PLATELET
Basophils Absolute: 0.2 10*3/uL — ABNORMAL HIGH (ref 0.0–0.1)
Eosinophils Absolute: 0 10*3/uL (ref 0.0–0.5)
HGB: 11.2 g/dL — ABNORMAL LOW (ref 11.6–15.9)
LYMPH%: 10.8 % — ABNORMAL LOW (ref 14.0–49.7)
MCV: 91.8 fL (ref 79.5–101.0)
MONO%: 4.2 % (ref 0.0–14.0)
NEUT#: 11.3 10*3/uL — ABNORMAL HIGH (ref 1.5–6.5)
NEUT%: 83.8 % — ABNORMAL HIGH (ref 38.4–76.8)
Platelets: 166 10*3/uL (ref 145–400)
RBC: 3.78 10*6/uL (ref 3.70–5.45)

## 2013-02-07 LAB — COMPREHENSIVE METABOLIC PANEL (CC13)
Alkaline Phosphatase: 147 U/L (ref 40–150)
BUN: 7.8 mg/dL (ref 7.0–26.0)
Creatinine: 0.8 mg/dL (ref 0.6–1.1)
Glucose: 125 mg/dl — ABNORMAL HIGH (ref 70–99)
Total Bilirubin: <0.2 mg/dL (ref 0.20–1.20)

## 2013-02-07 MED ORDER — EPIRUBICIN HCL CHEMO IV INJECTION 200 MG/100ML
100.0000 mg/m2 | Freq: Once | INTRAVENOUS | Status: AC
  Administered 2013-02-07: 206 mg via INTRAVENOUS
  Filled 2013-02-07: qty 103

## 2013-02-07 MED ORDER — FLUOROURACIL CHEMO INJECTION 2.5 GM/50ML
500.0000 mg/m2 | Freq: Once | INTRAVENOUS | Status: AC
  Administered 2013-02-07: 1050 mg via INTRAVENOUS
  Filled 2013-02-07: qty 21

## 2013-02-07 MED ORDER — SODIUM CHLORIDE 0.9 % IV SOLN
500.0000 mg/m2 | Freq: Once | INTRAVENOUS | Status: AC
  Administered 2013-02-07: 1040 mg via INTRAVENOUS
  Filled 2013-02-07: qty 52

## 2013-02-07 MED ORDER — PALONOSETRON HCL INJECTION 0.25 MG/5ML
0.2500 mg | Freq: Once | INTRAVENOUS | Status: AC
  Administered 2013-02-07: 0.25 mg via INTRAVENOUS

## 2013-02-07 MED ORDER — SODIUM CHLORIDE 0.9 % IV SOLN
Freq: Once | INTRAVENOUS | Status: AC
  Administered 2013-02-07: 15:00:00 via INTRAVENOUS

## 2013-02-07 MED ORDER — HEPARIN SOD (PORK) LOCK FLUSH 100 UNIT/ML IV SOLN
500.0000 [IU] | Freq: Once | INTRAVENOUS | Status: AC | PRN
  Administered 2013-02-07: 500 [IU]
  Filled 2013-02-07: qty 5

## 2013-02-07 MED ORDER — SODIUM CHLORIDE 0.9 % IV SOLN
150.0000 mg | Freq: Once | INTRAVENOUS | Status: AC
  Administered 2013-02-07: 150 mg via INTRAVENOUS
  Filled 2013-02-07: qty 5

## 2013-02-07 MED ORDER — SODIUM CHLORIDE 0.9 % IJ SOLN
10.0000 mL | INTRAMUSCULAR | Status: DC | PRN
  Administered 2013-02-07: 10 mL
  Filled 2013-02-07: qty 10

## 2013-02-07 MED ORDER — DEXAMETHASONE SODIUM PHOSPHATE 4 MG/ML IJ SOLN
12.0000 mg | Freq: Once | INTRAMUSCULAR | Status: AC
  Administered 2013-02-07: 12 mg via INTRAVENOUS

## 2013-02-07 MED ORDER — LORAZEPAM 2 MG/ML IJ SOLN
0.5000 mg | Freq: Once | INTRAMUSCULAR | Status: AC
  Administered 2013-02-07: 0.5 mg via INTRAVENOUS

## 2013-02-07 NOTE — Patient Instructions (Addendum)
Doing well Proceed with chemotherapy.

## 2013-02-07 NOTE — Progress Notes (Signed)
OFFICE PROGRESS NOTE  CC Dr. Ovidio Kin Dr. 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 Pierce Crane Dr. Ovidio Kin and Dr. Lurline Hare.Patient originally felt lumpiness in her left breast. This prompted a mammogram.Her mammogram was performed on 08/25/2012 showed pleomorphic calcifications of the entire upper outer left breast measuring 11 x 12 x 15 cm. On exam she had a for a multinodular mass like area over the left upper quadrant. Ultrasound confirmed multiple ill-defined hypoechoic areas. Ultrasound of the left axilla showed a few abnormal appearing lymph nodes. Biopsy of the lymph nodes and the breast was performed on 09/01/2012. 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 The ER was 2% PR 0%.   #2Patient was recommended A mastectomy. 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 Lymphovascular invasion was identified all resection margins were negative the 2 sentinel nodes were removed one was positive for metastatic disease. Patient then went on to have a full axillary lymph node dissection performed the pathology showed 10 lymph nodes negative for metastatic disease therefore 1 of 12 lymph nodes were positive. Final pathologic staging was T T3 N1. Tumor was ER negative PR negative HER-2/neu negative.  #3 patient did have genetic counseling performed but she declined genetic testing at that time. We discussed genetic counseling and testing again in patient is now agreeable and we will get this taken care of.  #4 patient was seen by  Dr. Pierce Crane on 11/17/2012 he recommended PET scan which was negative for metastatic disease. He also recommended adjuvant chemotherapy consisting of FEC given every 2 weeks for a total of 4 cycles. I have recommended Weekly Taxol and carboplatinum For a total of 12 weeks after completion of FEC.  CURRENT THERAPY:Patient will proceed with cycle 3 day 1 of FEC  INTERVAL HISTORY: Hannah Hale 47 y.o. female returns for evaluation for before her third cycle of chemotherapy.  She is feeling well today.  Her labs remain stable.  She denies fatigue, nausea/vomitng, fevers, chills, or any other concerns.    MEDICAL HISTORY: Past Medical History  Diagnosis Date  . Coughing     cold recent   . Breast cancer     left, Sept 20 2013    ALLERGIES:  has No Known Allergies.  MEDICATIONS:  Current Outpatient Prescriptions  Medication Sig Dispense Refill  . dexamethasone (DECADRON) 4 MG tablet Take 2 tablets by mouth once a day on the day after chemotherapy and then take 2 tablets two times a day for 2 days. Take with food.  30 tablet  1  . diphenhydrAMINE (SOMINEX) 25 MG tablet Take 50 mg by mouth 2 (two) times daily as needed. For itching      . HYDROcodone-acetaminophen (NORCO/VICODIN) 5-325 MG per tablet Take 1-2 tablets by mouth every 6 (six) hours as needed for pain.  30 tablet  1  . lidocaine-prilocaine (EMLA) cream Apply topically as needed.  30 g  6  . LORazepam (ATIVAN) 0.5 MG tablet Take 1 tablet (0.5 mg total) by mouth every 6 (six) hours as needed (Nausea or vomiting).  30 tablet  0  . ondansetron (ZOFRAN) 8 MG tablet Take 1 tablet (8 mg total) by mouth 2 (two) times daily as needed. Take two times a day as needed for nausea or vomiting starting on the third day after chemotherapy.  30 tablet  1  . prochlorperazine (COMPAZINE) 10 MG tablet Take 1 tablet (10 mg total) by mouth every 6 (six) hours as needed (Nausea or vomiting).  30 tablet  1  . prochlorperazine (COMPAZINE) 25 MG  suppository Place 1 suppository (25 mg total) rectally every 12 (twelve) hours as needed for nausea.  12 suppository  3   No current facility-administered medications for this visit.   Facility-Administered Medications Ordered in Other Visits  Medication Dose Route Frequency Provider Last Rate Last Dose  . cyclophosphamide (CYTOXAN) 1,040 mg in sodium chloride 0.9 % 250 mL chemo infusion  500 mg/m2 (Treatment Plan Actual) Intravenous Once Victorino December, MD      . epirubicin [facility]) chemo injection 206 mg  100 mg/m2 (Treatment Plan Actual) Intravenous Once Victorino December, MD 400 mL/hr at 02/07/13 1539 206 mg at 02/07/13 1539  . fluorouracil (ADRUCIL) chemo injection 1,050 mg  500 mg/m2 (Treatment Plan Actual) Intravenous Once Victorino December, MD      . heparin lock flush 100 unit/mL  500 Units Intracatheter Once PRN Victorino December, MD      . sodium chloride 0.9 % injection 10 mL  10 mL Intracatheter PRN Victorino December, MD        SURGICAL HISTORY:  Past Surgical History  Procedure Laterality Date  . Simple mastectomy w/ sentinel node biopsy  11/03/2012  . Simple mastectomy with axillary sentinel node biopsy  11/03/2012    Procedure: SIMPLE MASTECTOMY WITH AXILLARY SENTINEL NODE BIOPSY;  Surgeon: Kandis Cocking, MD;  Location: [facility] OR;  Service: General;  Laterality: Left;  . Portacath placement  12/08/2012    Procedure: INSERTION PORT-A-CATH;  Surgeon: Kandis Cocking, MD;  Location: [facility];  Service: General;  Laterality: N/A;  Power Port Placment and Left Axillary Node Dissection  . Axillary lymph node dissection  12/08/2012    Procedure: AXILLARY LYMPH NODE DISSECTION;  Surgeon: Kandis Cocking, MD;  Location: [facility];  Service: General;  Laterality: Left;    REVIEW OF SYSTEMS:  General: fatigue (-), night sweats (-), fever (-), pain (-) Lymph: palpable nodes (-) HEENT: vision changes (-), mucositis (-), gum bleeding (-), epistaxis (-) Cardiovascular: chest pain (-), palpitations  (-) Pulmonary: shortness of breath (-), dyspnea on exertion (-), cough (-), hemoptysis (-) GI:  Early satiety (-), melena (-), dysphagia (-), nausea/vomiting (-), diarrhea (-) GU: dysuria (-), hematuria (-), incontinence (-) Musculoskeletal: joint swelling (-), joint pain (-), back pain (-) Neuro: weakness (-), numbness (-), headache (-), confusion (-) Skin: Rash (-), lesions (-), dryness (-) Psych: depression (-), suicidal/homicidal ideation (-), feeling of hopelessness (-)  PHYSICAL EXAMINATION: Blood pressure 140/83, pulse 82, temperature 98.6 F (37 C), temperature source Oral, resp. rate 20, height 5\' 4"  (1.626 m), weight 213 lb 1.6 oz (96.662 kg). Body mass index is 36.56 kg/(m^2). General: Patient is a well appearing female in no acute distress HEENT: PERRLA, sclerae anicteric no conjunctival pallor, MMM Neck: supple, no palpable adenopathy Lungs: clear to auscultation bilaterally, no wheezes, rhonchi, or rales Cardiovascular: regular rate rhythm, S1, S2, no murmurs, rubs or gallops Abdomen: Soft, non-tender, non-distended, normoactive bowel sounds, no HSM Extremities: warm and well perfused, no clubbing, cyanosis, or edema Skin:  No rashes or lesions Neuro: Non-focal ECOG PERFORMANCE STATUS: 0 - Asymptomatic   LABORATORY DATA: Lab Results  Component Value Date   WBC 13.5* 02/07/2013   HGB 11.2* 02/07/2013   HCT 34.7* 02/07/2013   MCV 91.8 02/07/2013   PLT 166 02/07/2013      Chemistry      Component Value Date/Time   NA 139 02/07/2013 1321   NA 136 11/04/2012 0637   K 4.5 02/07/2013 1321   K 3.5 11/04/2012 0637   CL 107 02/07/2013 1321   CL 103 11/04/2012 0637   CO2 24 02/07/2013 1321   CO2 22 11/04/2012 0637   BUN 7.8 02/07/2013 1321   BUN 6 11/04/2012 0637   CREATININE 0.8 02/07/2013 1321   CREATININE 0.59 11/04/2012 0637      Component Value Date/Time   CALCIUM 9.0 02/07/2013 1321   CALCIUM 8.4 11/04/2012 0637   ALKPHOS 147 02/07/2013 1321   AST 13 02/07/2013 1321    ALT 14 02/07/2013 1321   BILITOT <0.20 Repeated and Verified 02/07/2013 1321       RADIOGRAPHIC STUDIES:  Dg Chest Port 1 View  12/08/2012  *RADIOLOGY REPORT*  Clinical Data: Post line placement.  Rule out pneumothorax.  The  PORTABLE CHEST - 1 VIEW  Comparison: 11/25/2012 PET CT.  10/26/2012 chest x-ray.  Findings: Curvilinear appearance right apex along the undersurface of the posterior aspect of the right second rib probably related to rib rather than pneumothorax.   This can be assessed on follow-up examination if there are any progressive symptoms to suggest pneumothorax.  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  Cardiomegaly.  Mild central pulmonary vascular prominence.  Prior left breast surgery and left axillary lymph node dissection.  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  No definitive pneumothorax.  Curvilinear structure right lung apex probably associated with the undersurface of rib as noted above.  This is a call report.   Original Report Authenticated By: Lacy Duverney, M.D.    Dg C-arm 1-60 Min-no Report  12/08/2012  CLINICAL DATA: port-a-cath insertion   C-ARM 1-60 MINUTES  Fluoroscopy was utilized by the requesting physician.  No radiographic  interpretation.      ASSESSMENT: 47 year old female with   #1stage III a( T3 N1) invasive ductal carcinoma she is status post mastectomy with axillary lymph node dissection with one of 12 lymph nodes positive for metastatic disease. Tumor was ER negative PR negative HER-2/neu negative. She had her mastectomy on 11/03/2012 with axillary lymph node dissection performed on 12/08/2012 and she had a Port-A-Cath placed at that time as well. He should have no evidence of metastatic disease elsewhere distally.  #2 patient is a good candidate for adjuvant chemotherapy. She will proceed with FEC every 2 weeks with day 2 Neulasta for 4 cycles. After that is completed then she will receive Taxol and carboplatinum on a  weekly basis for 12 weeks. I discussed the rationale for this.  #3 patient does need echocardiogram and I will get this scheduled. She already has had chemotherapy teaching class.  #4 at some point I will also refer her back to the genetic counselor for genetic testing-she declined this at her previous appt.    PLAN:   #1 Hannah Hale labs are stable. She will proceed with chemotherapy today.  #2 I will see her back next week for labs, appt.   All questions were answered. The patient knows to call the clinic with any problems, questions or concerns.  We can certainly see the patient much sooner if necessary.  I spent 15 minutes counseling the patient face to face. The total time spent in the appointment was 30 minutes.  Cherie Ouch Lyn Hollingshead, NP Medical Oncology [facility] Phone: [phone]

## 2013-02-07 NOTE — Patient Instructions (Signed)
[facility] Discharge Instructions for Patients Receiving Chemotherapy  Today you received the following chemotherapy agents: Epirubicin, 5 FU, Cytoxan  To help prevent nausea and vomiting after your treatment, we encourage you to take your nausea medications:  Ativan 0.5 mg every 6 hours as needed for nausea  Decadron 4 mg on 2/26, then twice daily x 2 days  Zofran 8 mg twice daily as needed for nausea (do not start until 3rd day after chemo)  Compazine 10 mg every 6 hours as needed for nausea Begin taking it at 1st sign of nausea and take it as often as prescribed.   If you develop nausea and vomiting that is not controlled by your nausea medication, call the clinic. If it is after clinic hours your family physician or the after hours number for the clinic or go to the Emergency Department.  Push po fluids for the next 48 hours.   BELOW ARE SYMPTOMS THAT SHOULD BE REPORTED IMMEDIATELY:  *FEVER GREATER THAN 100.5 F  *CHILLS WITH OR WITHOUT FEVER  NAUSEA AND VOMITING THAT IS NOT CONTROLLED WITH YOUR NAUSEA MEDICATION  *UNUSUAL SHORTNESS OF BREATH  *UNUSUAL BRUISING OR BLEEDING  TENDERNESS IN MOUTH AND THROAT WITH OR WITHOUT PRESENCE OF ULCERS  *URINARY PROBLEMS  *BOWEL PROBLEMS  UNUSUAL RASH Items with * indicate a potential emergency and should be followed up as soon as possible.  Feel free to call the clinic you have any questions or concerns. The clinic phone number is [phone].   I have been informed and understand all the instructions given to me. I know to contact the clinic, my physician, or go to the Emergency Department if any problems should occur. I do not have any questions at this time, but understand that I may call the clinic during office hours   should I have any questions or need assistance in obtaining follow up care.    __________________________________________  _____________  __________ Signature of Patient or Authorized  Representative            Date                   Time    __________________________________________ Nurse's Signature

## 2013-02-08 ENCOUNTER — Ambulatory Visit (HOSPITAL_BASED_OUTPATIENT_CLINIC_OR_DEPARTMENT_OTHER): Payer: 59

## 2013-02-08 VITALS — BP 136/88 | HR 92 | Temp 98.5°F

## 2013-02-08 DIAGNOSIS — Z5189 Encounter for other specified aftercare: Secondary | ICD-10-CM

## 2013-02-08 DIAGNOSIS — C50419 Malignant neoplasm of upper-outer quadrant of unspecified female breast: Secondary | ICD-10-CM

## 2013-02-08 MED ORDER — PEGFILGRASTIM INJECTION 6 MG/0.6ML
6.0000 mg | Freq: Once | SUBCUTANEOUS | Status: AC
  Administered 2013-02-08: 6 mg via SUBCUTANEOUS
  Filled 2013-02-08: qty 0.6

## 2013-02-14 ENCOUNTER — Telehealth: Payer: Self-pay | Admitting: Oncology

## 2013-02-14 ENCOUNTER — Telehealth: Payer: Self-pay | Admitting: *Deleted

## 2013-02-14 ENCOUNTER — Ambulatory Visit: Payer: 59

## 2013-02-14 ENCOUNTER — Other Ambulatory Visit (HOSPITAL_BASED_OUTPATIENT_CLINIC_OR_DEPARTMENT_OTHER): Payer: 59 | Admitting: Lab

## 2013-02-14 ENCOUNTER — Encounter: Payer: Self-pay | Admitting: Adult Health

## 2013-02-14 ENCOUNTER — Ambulatory Visit (HOSPITAL_BASED_OUTPATIENT_CLINIC_OR_DEPARTMENT_OTHER): Payer: 59 | Admitting: Adult Health

## 2013-02-14 VITALS — BP 144/88 | HR 94 | Temp 98.6°F | Resp 20 | Ht 64.0 in | Wt 211.3 lb

## 2013-02-14 DIAGNOSIS — C50419 Malignant neoplasm of upper-outer quadrant of unspecified female breast: Secondary | ICD-10-CM

## 2013-02-14 DIAGNOSIS — C50412 Malignant neoplasm of upper-outer quadrant of left female breast: Secondary | ICD-10-CM

## 2013-02-14 DIAGNOSIS — C50919 Malignant neoplasm of unspecified site of unspecified female breast: Secondary | ICD-10-CM

## 2013-02-14 LAB — COMPREHENSIVE METABOLIC PANEL (CC13)
Albumin: 3.4 g/dL — ABNORMAL LOW (ref 3.5–5.0)
Alkaline Phosphatase: 174 U/L — ABNORMAL HIGH (ref 40–150)
Glucose: 128 mg/dl — ABNORMAL HIGH (ref 70–99)
Potassium: 3.7 mEq/L (ref 3.5–5.1)
Sodium: 137 mEq/L (ref 136–145)
Total Protein: 6.6 g/dL (ref 6.4–8.3)

## 2013-02-14 LAB — CBC WITH DIFFERENTIAL/PLATELET
Eosinophils Absolute: 0.1 10*3/uL (ref 0.0–0.5)
MCV: 90.3 fL (ref 79.5–101.0)
MONO#: 0.3 10*3/uL (ref 0.1–0.9)
MONO%: 6 % (ref 0.0–14.0)
NEUT#: 4.1 10*3/uL (ref 1.5–6.5)
RBC: 3.59 10*6/uL — ABNORMAL LOW (ref 3.70–5.45)
RDW: 20.8 % — ABNORMAL HIGH (ref 11.2–14.5)
WBC: 5.7 10*3/uL (ref 3.9–10.3)

## 2013-02-14 NOTE — Progress Notes (Signed)
OFFICE PROGRESS NOTE  CC Dr. Ovidio Kin Dr. 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 Pierce Crane Dr. Ovidio Kin and Dr. Lurline Hare.Patient originally felt lumpiness in her left breast. This prompted a mammogram.Her mammogram was performed on 08/25/2012 showed pleomorphic calcifications of the entire upper outer left breast measuring 11 x 12 x 15 cm. On exam she had a for a multinodular mass like area over the left upper quadrant. Ultrasound confirmed multiple ill-defined hypoechoic areas. Ultrasound of the left axilla showed a few abnormal appearing lymph nodes. Biopsy of the lymph nodes and the breast was performed on 09/01/2012. 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 The ER was 2% PR 0%.   #2Patient was recommended A mastectomy. 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 Lymphovascular invasion was identified all resection margins were negative the 2 sentinel nodes were removed one was positive for metastatic disease. Patient then went on to have a full axillary lymph node dissection performed the pathology showed 10 lymph nodes negative for metastatic disease therefore 1 of 12 lymph nodes were positive. Final pathologic staging was T T3 N1. Tumor was ER negative PR negative HER-2/neu negative.  #3 patient did have genetic counseling performed but she declined genetic testing at that time. We discussed genetic counseling and testing again in patient is now agreeable and we will get this taken care of.  #4 patient was seen by  Dr. Pierce Crane on 11/17/2012 he recommended PET scan which was negative for metastatic disease. He also recommended adjuvant chemotherapy consisting of FEC given every 2 weeks for a total of 4 cycles. I have recommended Weekly Taxol and carboplatinum For a total of 12 weeks after completion of FEC.  CURRENT THERAPY: Cycle 3 day 8 of FEC  INTERVAL HISTORY: Gunda Maqueda 47 y.o. female returns for evaluation for after her third cycle of chemotherapy.  She is feeling well today.  She has tolerated her chemotherapy very well.  She has some mild nasal drainage that's improving, but otherwise denies fevers, chills, nausea, vomiting, pain or any other concerns.    MEDICAL HISTORY: Past Medical History  Diagnosis Date  . Coughing     cold recent   . Breast cancer     left, Sept 20 2013    ALLERGIES:  has No Known Allergies.  MEDICATIONS:  Current Outpatient Prescriptions  Medication Sig Dispense Refill  . dexamethasone (DECADRON) 4 MG tablet Take 2 tablets by mouth once a day on the day after chemotherapy and then take 2 tablets two times a day for 2 days. Take with food.  30 tablet  1  . diphenhydrAMINE (SOMINEX) 25 MG tablet Take 50 mg by mouth 2 (two) times daily as needed. For itching      . HYDROcodone-acetaminophen (NORCO/VICODIN) 5-325 MG per tablet Take 1-2 tablets by mouth every 6 (six) hours as needed for pain.  30 tablet  1  . lidocaine-prilocaine (EMLA) cream Apply topically as needed.  30 g  6  . LORazepam (ATIVAN) 0.5 MG tablet Take 1 tablet (0.5 mg total) by mouth  every 6 (six) hours as needed (Nausea or vomiting).  30 tablet  0  . ondansetron (ZOFRAN) 8 MG tablet Take 1 tablet (8 mg total) by mouth 2 (two) times daily as needed. Take two times a day as needed for nausea or vomiting starting on the third day after chemotherapy.  30 tablet  1  . prochlorperazine (COMPAZINE) 10 MG tablet Take 1 tablet (10 mg total) by mouth every 6 (six) hours as needed (Nausea or vomiting).  30  tablet  1  . prochlorperazine (COMPAZINE) 25 MG suppository Place 1 suppository (25 mg total) rectally every 12 (twelve) hours as needed for nausea.  12 suppository  3   No current facility-administered medications for this visit.    SURGICAL HISTORY:  Past Surgical History  Procedure Laterality Date  . Simple mastectomy w/ sentinel node biopsy  11/03/2012  . Simple mastectomy with axillary sentinel node biopsy  11/03/2012    Procedure: SIMPLE MASTECTOMY WITH AXILLARY SENTINEL NODE BIOPSY;  Surgeon: Kandis Cocking, MD;  Location: [facility] OR;  Service: General;  Laterality: Left;  . Portacath placement  12/08/2012    Procedure: INSERTION PORT-A-CATH;  Surgeon: Kandis Cocking, MD;  Location: [facility];  Service: General;  Laterality: N/A;  Power Port Placment and Left Axillary Node Dissection  . Axillary lymph node dissection  12/08/2012    Procedure: AXILLARY LYMPH NODE DISSECTION;  Surgeon: Kandis Cocking, MD;  Location: [facility];  Service: General;  Laterality: Left;    REVIEW OF SYSTEMS:  General: fatigue (-), night sweats (-), fever (-), pain (-) Lymph: palpable nodes (-) HEENT: vision changes (-), mucositis (-), gum bleeding (-), epistaxis (-) Cardiovascular: chest pain (-), palpitations (-) Pulmonary: shortness of breath (-), dyspnea on exertion (-), cough (-), hemoptysis (-) GI:  Early satiety (-), melena (-), dysphagia (-), nausea/vomiting (-), diarrhea (-) GU: dysuria (-), hematuria (-), incontinence (-) Musculoskeletal: joint swelling (-), joint pain (-), back pain (-) Neuro: weakness (-), numbness (-), headache (-), confusion (-) Skin: Rash (-), lesions (-), dryness (-) Psych: depression (-), suicidal/homicidal ideation (-), feeling of hopelessness (-)  PHYSICAL EXAMINATION: Blood pressure 144/88, pulse 94, temperature 98.6 F (37 C), temperature source Oral, resp. rate 20, height 5\' 4"  (1.626 m), weight 211 lb 4.8 oz (95.845 kg). Body mass index is 36.25 kg/(m^2). General: Patient  is a well appearing female in no acute distress HEENT: PERRLA, sclerae anicteric no conjunctival pallor, MMM Neck: supple, no palpable adenopathy Lungs: clear to auscultation bilaterally, no wheezes, rhonchi, or rales Cardiovascular: regular rate rhythm, S1, S2, no murmurs, rubs or gallops Abdomen: Soft, non-tender, non-distended, normoactive bowel sounds, no HSM Extremities: warm and well perfused, no clubbing, cyanosis, or edema Skin: No rashes or lesions Neuro: Non-focal ECOG PERFORMANCE STATUS: 0 - Asymptomatic   LABORATORY DATA: Lab Results  Component Value Date   WBC 5.7 02/14/2013   HGB 10.6* 02/14/2013   HCT 32.4* 02/14/2013   MCV 90.3 02/14/2013   PLT 206 02/14/2013      Chemistry      Component Value Date/Time   NA 139 02/07/2013 1321   NA 136 11/04/2012 0637   K 4.5 02/07/2013 1321   K 3.5 11/04/2012 0637   CL 107 02/07/2013 1321   CL 103 11/04/2012 0637   CO2 24 02/07/2013 1321   CO2 22 11/04/2012 0637   BUN 7.8 02/07/2013 1321   BUN 6 11/04/2012 0637   CREATININE 0.8 02/07/2013 1321   CREATININE 0.59 11/04/2012 1610  Component Value Date/Time   CALCIUM 9.0 02/07/2013 1321   CALCIUM 8.4 11/04/2012 0637   ALKPHOS 147 02/07/2013 1321   AST 13 02/07/2013 1321   ALT 14 02/07/2013 1321   BILITOT <0.20 Repeated and Verified 02/07/2013 1321       RADIOGRAPHIC STUDIES:  Dg Chest Port 1 View  12/08/2012  *RADIOLOGY REPORT*  Clinical Data: Post line placement.  Rule out pneumothorax.  The  PORTABLE CHEST - 1 VIEW  Comparison: 11/25/2012 PET CT.  10/26/2012 chest x-ray.  Findings: Curvilinear appearance right apex along the undersurface of the posterior aspect of the right second rib probably related to rib rather than pneumothorax.   This can be assessed on follow-up examination if there are any progressive symptoms to suggest pneumothorax.  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  Cardiomegaly.  Mild central pulmonary vascular prominence.  Prior left breast surgery and  left axillary lymph node dissection.  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  No definitive pneumothorax.  Curvilinear structure right lung apex probably associated with the undersurface of rib as noted above.  This is a call report.   Original Report Authenticated By: Lacy Duverney, M.D.    Dg C-arm 1-60 Min-no Report  12/08/2012  CLINICAL DATA: port-a-cath insertion   C-ARM 1-60 MINUTES  Fluoroscopy was utilized by the requesting physician.  No radiographic  interpretation.      ASSESSMENT: 47 year old female with   #1stage III a( T3 N1) invasive ductal carcinoma she is status post mastectomy with axillary lymph node dissection with one of 12 lymph nodes positive for metastatic disease. Tumor was ER negative PR negative HER-2/neu negative. She had her mastectomy on 11/03/2012 with axillary lymph node dissection performed on 12/08/2012 and she had a Port-A-Cath placed at that time as well. She should have no evidence of metastatic disease elsewhere distally.  #2 patient is a good candidate for adjuvant chemotherapy. She will proceed with FEC every 2 weeks with day 2 Neulasta for 4 cycles. After that is completed then she will receive Taxol and carboplatinum on a weekly basis for 12 weeks. I discussed the rationale for this.  #3 patient does need echocardiogram and I will get this scheduled. She already has had chemotherapy teaching class.  #4 at some point I will also refer her back to the genetic counselor for genetic testing-she declined this at her previous appt.    PLAN:   #1 Ms Hogans labs are stable. She continues to tolerate her chemotherapy very well.  She will schedule her appointments for her next regimen of chemotherapy.  I reviewed the basics of Taxol/Carbo with the patient and her husband and gave her handouts.  She will read over the handouts and bring anymore questions back to me next week at her next appt.   #2 I will see her back next week for labs, appt  and her final cycle of FEC.   All questions were answered. The patient knows to call the clinic with any problems, questions or concerns. We can certainly see the patient much sooner if necessary.  I spent 25 minutes counseling the patient face to face. The total time spent in the appointment was 30 minutes.  Cherie Ouch Lyn Hollingshead, NP Medical Oncology [facility] Phone: [phone]

## 2013-02-14 NOTE — Patient Instructions (Addendum)
Doing well.  Should your nasal drainage and cough get worse, please call us and we will give you an antibiotic.  We will see you next week for your last cycle of chemotherapy.   Paclitaxel injection What is this medicine? PACLITAXEL (PAK li TAX el) is a chemotherapy drug. It targets fast dividing cells, like cancer cells, and causes these cells to die. This medicine is used to treat ovarian cancer, breast cancer, and other cancers. This medicine may be used for other purposes; ask your health care provider or pharmacist if you have questions. What should I tell my health care provider before I take this medicine? They need to know if you have any of these conditions: -blood disorders -irregular heartbeat -infection (especially a virus infection such as chickenpox, cold sores, or herpes) -liver disease -previous or ongoing radiation therapy -an unusual or allergic reaction to paclitaxel, alcohol, polyoxyethylated castor oil, other chemotherapy agents, other medicines, foods, dyes, or preservatives -pregnant or trying to get pregnant -breast-feeding How should I use this medicine? This drug is given as an infusion into a vein. It is administered in a hospital or clinic by a specially trained health care professional. Talk to your pediatrician regarding the use of this medicine in children. Special care may be needed. Overdosage: If you think you have taken too much of this medicine contact a poison control center or emergency room at once. NOTE: This medicine is only for you. Do not share this medicine with others. What if I miss a dose? It is important not to miss your dose. Call your doctor or health care professional if you are unable to keep an appointment. What may interact with this medicine? Do not take this medicine with any of the following medications: -disulfiram -metronidazole This medicine may also interact with the following  medications: -cyclosporine -dexamethasone -diazepam -ketoconazole -medicines to increase blood counts like filgrastim, pegfilgrastim, sargramostim -other chemotherapy drugs like cisplatin, doxorubicin, epirubicin, etoposide, teniposide, vincristine -quinidine -testosterone -vaccines -verapamil Talk to your doctor or health care professional before taking any of these medicines: -acetaminophen -aspirin -ibuprofen -ketoprofen -naproxen This list may not describe all possible interactions. Give your health care provider a list of all the medicines, herbs, non-prescription drugs, or dietary supplements you use. Also tell them if you smoke, drink alcohol, or use illegal drugs. Some items may interact with your medicine. What should I watch for while using this medicine? Your condition will be monitored carefully while you are receiving this medicine. You will need important blood work done while you are taking this medicine. This drug may make you feel generally unwell. This is not uncommon, as chemotherapy can affect healthy cells as well as cancer cells. Report any side effects. Continue your course of treatment even though you feel ill unless your doctor tells you to stop. In some cases, you may be given additional medicines to help with side effects. Follow all directions for their use. Call your doctor or health care professional for advice if you get a fever, chills or sore throat, or other symptoms of a cold or flu. Do not treat yourself. This drug decreases your body's ability to fight infections. Try to avoid being around people who are sick. This medicine may increase your risk to bruise or bleed. Call your doctor or health care professional if you notice any unusual bleeding. Be careful brushing and flossing your teeth or using a toothpick because you may get an infection or bleed more easily. If you have any dental  work done, tell your dentist you are receiving this medicine. Avoid  taking products that contain aspirin, acetaminophen, ibuprofen, naproxen, or ketoprofen unless instructed by your doctor. These medicines may hide a fever. Do not become pregnant while taking this medicine. Women should inform their doctor if they wish to become pregnant or think they might be pregnant. There is a potential for serious side effects to an unborn child. Talk to your health care professional or pharmacist for more information. Do not breast-feed an infant while taking this medicine. Men are advised not to father a child while receiving this medicine. What side effects may I notice from receiving this medicine? Side effects that you should report to your doctor or health care professional as soon as possible: -allergic reactions like skin rash, itching or hives, swelling of the face, lips, or tongue -low blood counts - This drug may decrease the number of white blood cells, red blood cells and platelets. You may be at increased risk for infections and bleeding. -signs of infection - fever or chills, cough, sore throat, pain or difficulty passing urine -signs of decreased platelets or bleeding - bruising, pinpoint red spots on the skin, black, tarry stools, nosebleeds -signs of decreased red blood cells - unusually weak or tired, fainting spells, lightheadedness -breathing problems -chest pain -high or low blood pressure -mouth sores -nausea and vomiting -pain, swelling, redness or irritation at the injection site -pain, tingling, numbness in the hands or feet -slow or irregular heartbeat -swelling of the ankle, feet, hands Side effects that usually do not require medical attention (report to your doctor or health care professional if they continue or are bothersome): -bone pain -complete hair loss including hair on your head, underarms, pubic hair, eyebrows, and eyelashes -changes in the color of fingernails -diarrhea -loosening of the fingernails -loss of appetite -muscle or  joint pain -red flush to skin -sweating This list may not describe all possible side effects. Call your doctor for medical advice about side effects. You may report side effects to FDA at 1-800-FDA-1088. Where should I keep my medicine? This drug is given in a hospital or clinic and will not be stored at home. NOTE: This sheet is a summary. It may not cover all possible information. If you have questions about this medicine, talk to your doctor, pharmacist, or health care provider.  2012, [employer]. (11/12/2008 11:54:26 AM)  Carboplatin injection What is this medicine? CARBOPLATIN (KAR boe pla tin) is a chemotherapy drug. It targets fast dividing cells, like cancer cells, and causes these cells to die. This medicine is used to treat ovarian cancer and many other cancers. This medicine may be used for other purposes; ask your health care provider or pharmacist if you have questions. What should I tell my health care provider before I take this medicine? They need to know if you have any of these conditions: -blood disorders -hearing problems -kidney disease -recent or ongoing radiation therapy -an unusual or allergic reaction to carboplatin, cisplatin, other chemotherapy, other medicines, foods, dyes, or preservatives -pregnant or trying to get pregnant -breast-feeding How should I use this medicine? This drug is usually given as an infusion into a vein. It is administered in a hospital or clinic by a specially trained health care professional. Talk to your pediatrician regarding the use of this medicine in children. Special care may be needed. Overdosage: If you think you have taken too much of this medicine contact a poison control center or emergency room at once. NOTE:  This medicine is only for you. Do not share this medicine with others. What if I miss a dose? It is important not to miss a dose. Call your doctor or health care professional if you are unable to keep an  appointment. What may interact with this medicine? -medicines for seizures -medicines to increase blood counts like filgrastim, pegfilgrastim, sargramostim -some antibiotics like amikacin, gentamicin, neomycin, streptomycin, tobramycin -vaccines Talk to your doctor or health care professional before taking any of these medicines: -acetaminophen -aspirin -ibuprofen -ketoprofen -naproxen This list may not describe all possible interactions. Give your health care provider a list of all the medicines, herbs, non-prescription drugs, or dietary supplements you use. Also tell them if you smoke, drink alcohol, or use illegal drugs. Some items may interact with your medicine. What should I watch for while using this medicine? Your condition will be monitored carefully while you are receiving this medicine. You will need important blood work done while you are taking this medicine. This drug may make you feel generally unwell. This is not uncommon, as chemotherapy can affect healthy cells as well as cancer cells. Report any side effects. Continue your course of treatment even though you feel ill unless your doctor tells you to stop. In some cases, you may be given additional medicines to help with side effects. Follow all directions for their use. Call your doctor or health care professional for advice if you get a fever, chills or sore throat, or other symptoms of a cold or flu. Do not treat yourself. This drug decreases your body's ability to fight infections. Try to avoid being around people who are sick. This medicine may increase your risk to bruise or bleed. Call your doctor or health care professional if you notice any unusual bleeding. Be careful brushing and flossing your teeth or using a toothpick because you may get an infection or bleed more easily. If you have any dental work done, tell your dentist you are receiving this medicine. Avoid taking products that contain aspirin, acetaminophen,  ibuprofen, naproxen, or ketoprofen unless instructed by your doctor. These medicines may hide a fever. Do not become pregnant while taking this medicine. Women should inform their doctor if they wish to become pregnant or think they might be pregnant. There is a potential for serious side effects to an unborn child. Talk to your health care professional or pharmacist for more information. Do not breast-feed an infant while taking this medicine. What side effects may I notice from receiving this medicine? Side effects that you should report to your doctor or health care professional as soon as possible: -allergic reactions like skin rash, itching or hives, swelling of the face, lips, or tongue -signs of infection - fever or chills, cough, sore throat, pain or difficulty passing urine -signs of decreased platelets or bleeding - bruising, pinpoint red spots on the skin, black, tarry stools, nosebleeds -signs of decreased red blood cells - unusually weak or tired, fainting spells, lightheadedness -breathing problems -changes in hearing -changes in vision -chest pain -high blood pressure -low blood counts - This drug may decrease the number of white blood cells, red blood cells and platelets. You may be at increased risk for infections and bleeding. -nausea and vomiting -pain, swelling, redness or irritation at the injection site -pain, tingling, numbness in the hands or feet -problems with balance, talking, walking -trouble passing urine or change in the amount of urine Side effects that usually do not require medical attention (report to your doctor or  health care professional if they continue or are bothersome): -hair loss -loss of appetite -metallic taste in the mouth or changes in taste This list may not describe all possible side effects. Call your doctor for medical advice about side effects. You may report side effects to FDA at 1-800-FDA-1088. Where should I keep my medicine? This drug  is given in a hospital or clinic and will not be stored at home. NOTE: This sheet is a summary. It may not cover all possible information. If you have questions about this medicine, talk to your doctor, pharmacist, or health care provider.  2012, [employer]. (03/06/2008 2:38:05 PM)

## 2013-02-14 NOTE — Telephone Encounter (Signed)
lmonvm advising the pt to pick up her April and may 2014 appt calendar the next time she comes in the bldg.

## 2013-02-14 NOTE — Telephone Encounter (Signed)
Sent michele a staff message to add the pt's chemo appts in for April and may.

## 2013-02-14 NOTE — Telephone Encounter (Signed)
gv pt appt schedule for March thru May.

## 2013-02-14 NOTE — Telephone Encounter (Signed)
Per staff message and POF I have scheduled appts.  JMW  

## 2013-02-15 ENCOUNTER — Ambulatory Visit: Payer: 59

## 2013-02-15 ENCOUNTER — Other Ambulatory Visit: Payer: Self-pay | Admitting: Medical Oncology

## 2013-02-15 ENCOUNTER — Ambulatory Visit: Payer: 59 | Attending: Surgery

## 2013-02-15 DIAGNOSIS — IMO0001 Reserved for inherently not codable concepts without codable children: Secondary | ICD-10-CM | POA: Insufficient documentation

## 2013-02-15 DIAGNOSIS — I890 Lymphedema, not elsewhere classified: Secondary | ICD-10-CM | POA: Insufficient documentation

## 2013-02-15 DIAGNOSIS — M24519 Contracture, unspecified shoulder: Secondary | ICD-10-CM | POA: Insufficient documentation

## 2013-02-15 MED ORDER — AZITHROMYCIN 250 MG PO TABS: ORAL_TABLET | ORAL | Status: DC

## 2013-02-15 NOTE — Telephone Encounter (Signed)
Pt LVMOM stating her sniffles turned into a cough. Review pt symptoms with Augustin Schooling, NP, zpack 250 mg PO ordered for pt to take 2 tablets by mouth on day one and 1 tablet daily till complete, disp #6. Prescription sent to pt's pharmacy of choice, [facility]. Patient informed and gave verbal understanding. No further questions at this time.

## 2013-02-20 ENCOUNTER — Telehealth: Payer: Self-pay | Admitting: Emergency Medicine

## 2013-02-20 NOTE — Telephone Encounter (Signed)
Patient called stating she has a "boil under right breast". Patient states that there is some drainage and it is covered with a gauze. Denies any fever. States it is "sore". Patient scheduled for lab/[facility]sey/chemo on 3/11. Reminded patient of appointment times for 3/11.

## 2013-02-21 ENCOUNTER — Ambulatory Visit (HOSPITAL_BASED_OUTPATIENT_CLINIC_OR_DEPARTMENT_OTHER): Payer: 59 | Admitting: Adult Health

## 2013-02-21 ENCOUNTER — Ambulatory Visit: Payer: 59

## 2013-02-21 ENCOUNTER — Encounter: Payer: Self-pay | Admitting: Adult Health

## 2013-02-21 ENCOUNTER — Ambulatory Visit (HOSPITAL_BASED_OUTPATIENT_CLINIC_OR_DEPARTMENT_OTHER): Payer: 59

## 2013-02-21 ENCOUNTER — Other Ambulatory Visit (HOSPITAL_BASED_OUTPATIENT_CLINIC_OR_DEPARTMENT_OTHER): Payer: 59 | Admitting: Lab

## 2013-02-21 VITALS — BP 149/90 | HR 87 | Temp 98.3°F | Resp 20 | Ht 64.0 in | Wt 212.6 lb

## 2013-02-21 DIAGNOSIS — C50412 Malignant neoplasm of upper-outer quadrant of left female breast: Secondary | ICD-10-CM

## 2013-02-21 DIAGNOSIS — C50419 Malignant neoplasm of upper-outer quadrant of unspecified female breast: Secondary | ICD-10-CM

## 2013-02-21 DIAGNOSIS — Z171 Estrogen receptor negative status [ER-]: Secondary | ICD-10-CM

## 2013-02-21 DIAGNOSIS — Z5111 Encounter for antineoplastic chemotherapy: Secondary | ICD-10-CM

## 2013-02-21 DIAGNOSIS — N649 Disorder of breast, unspecified: Secondary | ICD-10-CM

## 2013-02-21 DIAGNOSIS — C50919 Malignant neoplasm of unspecified site of unspecified female breast: Secondary | ICD-10-CM

## 2013-02-21 LAB — CBC WITH DIFFERENTIAL/PLATELET
BASO%: 1.2 % (ref 0.0–2.0)
EOS%: 0.4 % (ref 0.0–7.0)
Eosinophils Absolute: 0 10*3/uL (ref 0.0–0.5)
MCH: 29.7 pg (ref 25.1–34.0)
MCHC: 32.3 g/dL (ref 31.5–36.0)
MCV: 91.9 fL (ref 79.5–101.0)
MONO%: 9 % (ref 0.0–14.0)
NEUT#: 7.1 10*3/uL — ABNORMAL HIGH (ref 1.5–6.5)
RBC: 3.6 10*6/uL — ABNORMAL LOW (ref 3.70–5.45)
RDW: 20.8 % — ABNORMAL HIGH (ref 11.2–14.5)
nRBC: 4 % — ABNORMAL HIGH (ref 0–0)

## 2013-02-21 LAB — COMPREHENSIVE METABOLIC PANEL (CC13)
ALT: 13 U/L (ref 0–55)
AST: 15 U/L (ref 5–34)
Alkaline Phosphatase: 158 U/L — ABNORMAL HIGH (ref 40–150)
Sodium: 142 mEq/L (ref 136–145)
Total Bilirubin: <0.2 mg/dL (ref 0.20–1.20)
Total Protein: 6.5 g/dL (ref 6.4–8.3)

## 2013-02-21 MED ORDER — LORAZEPAM 2 MG/ML IJ SOLN
0.5000 mg | Freq: Once | INTRAMUSCULAR | Status: AC
  Administered 2013-02-21: 0.5 mg via INTRAVENOUS

## 2013-02-21 MED ORDER — SODIUM CHLORIDE 0.9 % IV SOLN
150.0000 mg | Freq: Once | INTRAVENOUS | Status: AC
  Administered 2013-02-21: 150 mg via INTRAVENOUS
  Filled 2013-02-21: qty 5

## 2013-02-21 MED ORDER — EPIRUBICIN HCL CHEMO IV INJECTION 200 MG/100ML
100.0000 mg/m2 | Freq: Once | INTRAVENOUS | Status: AC
  Administered 2013-02-21: 206 mg via INTRAVENOUS
  Filled 2013-02-21: qty 103

## 2013-02-21 MED ORDER — SODIUM CHLORIDE 0.9 % IJ SOLN
10.0000 mL | INTRAMUSCULAR | Status: DC | PRN
  Administered 2013-02-21: 10 mL
  Filled 2013-02-21: qty 10

## 2013-02-21 MED ORDER — FLUOROURACIL CHEMO INJECTION 2.5 GM/50ML
500.0000 mg/m2 | Freq: Once | INTRAVENOUS | Status: AC
  Administered 2013-02-21: 1050 mg via INTRAVENOUS
  Filled 2013-02-21: qty 21

## 2013-02-21 MED ORDER — PALONOSETRON HCL INJECTION 0.25 MG/5ML
0.2500 mg | Freq: Once | INTRAVENOUS | Status: AC
  Administered 2013-02-21: 0.25 mg via INTRAVENOUS

## 2013-02-21 MED ORDER — HEPARIN SOD (PORK) LOCK FLUSH 100 UNIT/ML IV SOLN
500.0000 [IU] | Freq: Once | INTRAVENOUS | Status: AC | PRN
  Administered 2013-02-21: 500 [IU]
  Filled 2013-02-21: qty 5

## 2013-02-21 MED ORDER — SODIUM CHLORIDE 0.9 % IV SOLN
500.0000 mg/m2 | Freq: Once | INTRAVENOUS | Status: AC
  Administered 2013-02-21: 1040 mg via INTRAVENOUS
  Filled 2013-02-21: qty 52

## 2013-02-21 MED ORDER — DEXAMETHASONE SODIUM PHOSPHATE 4 MG/ML IJ SOLN
12.0000 mg | Freq: Once | INTRAMUSCULAR | Status: AC
  Administered 2013-02-21: 12 mg via INTRAVENOUS

## 2013-02-21 MED ORDER — CEPHALEXIN 500 MG PO CAPS: 500.0000 mg | ORAL_CAPSULE | Freq: Four times a day (QID) | ORAL | Status: DC

## 2013-02-21 MED ORDER — SODIUM CHLORIDE 0.9 % IV SOLN
Freq: Once | INTRAVENOUS | Status: AC
  Administered 2013-02-21: 16:00:00 via INTRAVENOUS

## 2013-02-21 NOTE — Patient Instructions (Addendum)
[facility] Discharge Instructions for Patients Receiving Chemotherapy  Today you received the following chemotherapy agents epirubicin/ cytoxan/ 5FU  To help prevent nausea and vomiting after your treatment, we encourage you to take your nausea medication and take it as often as prescribed   If you develop nausea and vomiting that is not controlled by your nausea medication, call the clinic. If it is after clinic hours your family physician or the after hours number for the clinic or go to the Emergency Department.   BELOW ARE SYMPTOMS THAT SHOULD BE REPORTED IMMEDIATELY:  *FEVER GREATER THAN 100.5 F  *CHILLS WITH OR WITHOUT FEVER  NAUSEA AND VOMITING THAT IS NOT CONTROLLED WITH YOUR NAUSEA MEDICATION  *UNUSUAL SHORTNESS OF BREATH  *UNUSUAL BRUISING OR BLEEDING  TENDERNESS IN MOUTH AND THROAT WITH OR WITHOUT PRESENCE OF ULCERS  *URINARY PROBLEMS  *BOWEL PROBLEMS  UNUSUAL RASH Items with * indicate a potential emergency and should be followed up as soon as possible.  One of the nurses will contact you 24 hours after your treatment. Please let the nurse know about any problems that you may have experienced. Feel free to call the clinic you have any questions or concerns. The clinic phone number is [phone].   I have been informed and understand all the instructions given to me. I know to contact the clinic, my physician, or go to the Emergency Department if any problems should occur. I do not have any questions at this time, but understand that I may call the clinic during office hours   should I have any questions or need assistance in obtaining follow up care.    __________________________________________  _____________  __________ Signature of Patient or Authorized Representative            Date                   Time    __________________________________________ Nurse's Signature

## 2013-02-21 NOTE — Progress Notes (Signed)
OFFICE PROGRESS NOTE  CC Dr. Ovidio Kin Dr. 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 Pierce Crane Dr. Ovidio Kin and Dr. Lurline Hare.Patient originally felt lumpiness in her left breast. This prompted a mammogram.Her mammogram was performed on 08/25/2012 showed pleomorphic calcifications of the entire upper outer left breast measuring 11 x 12 x 15 cm. On exam she had a for a multinodular mass like area over the left upper quadrant. Ultrasound confirmed multiple ill-defined hypoechoic areas. Ultrasound of the left axilla showed a few abnormal appearing lymph nodes. Biopsy of the lymph nodes and the breast was performed on 09/01/2012. 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 The ER was 2% PR 0%.   #2Patient was recommended A mastectomy. 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 Lymphovascular invasion was identified all resection margins were negative the 2 sentinel nodes were removed one was positive for metastatic disease. Patient then went on to have a full axillary lymph node dissection performed the pathology showed 10 lymph nodes negative for metastatic disease therefore 1 of 12 lymph nodes were positive. Final pathologic staging was T T3 N1. Tumor was ER negative PR negative HER-2/neu negative.  #3 patient did have genetic counseling performed but she declined genetic testing at that time. We discussed genetic counseling and testing again in patient is now agreeable and we will get this taken care of.  #4 patient was seen by  Dr. Pierce Crane on 11/17/2012 he recommended PET scan which was negative for metastatic disease. He also recommended adjuvant chemotherapy consisting of FEC given every 2 weeks for a total of 4 cycles. I have recommended Weekly Taxol and carboplatinum For a total of 12 weeks after completion of FEC.  CURRENT THERAPY: Cycle 4 day 1 of FEC  INTERVAL HISTORY: Jacquelyne Quarry 47 y.o. female returns for evaluation before her fourth cycle of chemotherapy.  She did have a pustular lesion under her right breast that she popped, and is now open.  There is no further foul smell, discharge, or tenderness in this area.  She denies fevers, chills, nausea, vomiting, pain, weakness, numbness or any other concerns.     MEDICAL HISTORY: Past Medical History  Diagnosis Date  . Coughing     cold recent   . Breast cancer     left, Sept 20 2013    ALLERGIES:  has No Known Allergies.  MEDICATIONS:  Current Outpatient Prescriptions  Medication Sig Dispense Refill  . azithromycin (ZITHROMAX Z-PAK) 250 MG tablet Take 2 tablets by mouth on day one, then 1 tablet daily till complete.  6 each  0  . dexamethasone (DECADRON) 4 MG tablet Take 2 tablets by mouth once a day on the day after chemotherapy and then take 2 tablets two times a day for 2 days. Take with food.  30 tablet  1  . diphenhydrAMINE (SOMINEX) 25 MG tablet Take 50 mg by mouth 2 (two) times daily as needed. For itching      . HYDROcodone-acetaminophen (NORCO/VICODIN) 5-325 MG per tablet Take 1-2 tablets by mouth every 6 (six) hours as  needed for pain.  30 tablet  1  . lidocaine-prilocaine (EMLA) cream Apply topically as needed.  30 g  6  . LORazepam (ATIVAN) 0.5 MG tablet Take 1 tablet (0.5 mg total) by mouth every 6 (six) hours as needed (Nausea or vomiting).  30 tablet  0  . ondansetron (ZOFRAN) 8 MG tablet Take 1 tablet (8 mg total) by mouth 2 (two) times daily as needed. Take two times a day as needed for nausea or vomiting starting on the third day  after chemotherapy.  30 tablet  1  . prochlorperazine (COMPAZINE) 10 MG tablet Take 1 tablet (10 mg total) by mouth every 6 (six) hours as needed (Nausea or vomiting).  30 tablet  1  . prochlorperazine (COMPAZINE) 25 MG suppository Place 1 suppository (25 mg total) rectally every 12 (twelve) hours as needed for nausea.  12 suppository  3  . cephALEXin (KEFLEX) 500 MG capsule Take 1 capsule (500 mg total) by mouth 4 (four) times daily.  40 capsule  0   No current facility-administered medications for this visit.    SURGICAL HISTORY:  Past Surgical History  Procedure Laterality Date  . Simple mastectomy w/ sentinel node biopsy  11/03/2012  . Simple mastectomy with axillary sentinel node biopsy  11/03/2012    Procedure: SIMPLE MASTECTOMY WITH AXILLARY SENTINEL NODE BIOPSY;  Surgeon: Kandis Cocking, MD;  Location: [facility] OR;  Service: General;  Laterality: Left;  . Portacath placement  12/08/2012    Procedure: INSERTION PORT-A-CATH;  Surgeon: Kandis Cocking, MD;  Location: [facility];  Service: General;  Laterality: N/A;  Power Port Placment and Left Axillary Node Dissection  . Axillary lymph node dissection  12/08/2012    Procedure: AXILLARY LYMPH NODE DISSECTION;  Surgeon: Kandis Cocking, MD;  Location: [facility];  Service: General;  Laterality: Left;    REVIEW OF SYSTEMS:  General: fatigue (-), night sweats (-), fever (-), pain (-) Lymph: palpable nodes (-) HEENT: vision changes (-), mucositis (-), gum bleeding (-), epistaxis (-) Cardiovascular: chest pain (-), palpitations (-) Pulmonary: shortness of breath (-), dyspnea on exertion (-), cough (-), hemoptysis (-) GI:  Early satiety (-), melena (-), dysphagia (-), nausea/vomiting (-), diarrhea (-) GU: dysuria (-), hematuria (-), incontinence (-) Musculoskeletal: joint swelling (-), joint pain (-), back pain (-) Neuro: weakness (-), numbness (-), headache (-), confusion (-) Skin: Rash (-), lesions (+), dryness (-) Psych: depression (-),  suicidal/homicidal ideation (-), feeling of hopelessness (-)  PHYSICAL EXAMINATION: Blood pressure 149/90, pulse 87, temperature 98.3 F (36.8 C), temperature source Oral, resp. rate 20, height 5\' 4"  (1.626 m), weight 212 lb 9.6 oz (96.435 kg). Body mass index is 36.47 kg/(m^2). General: Patient is a well appearing female in no acute distress HEENT: PERRLA, sclerae anicteric no conjunctival pallor, MMM Neck: supple, no palpable adenopathy Lungs: clear to auscultation bilaterally, no wheezes, rhonchi, or rales Cardiovascular: regular rate rhythm, S1, S2, no murmurs, rubs or gallops Abdomen: Soft, non-tender, non-distended, normoactive bowel sounds, no HSM Extremities: warm and well perfused, no clubbing, cyanosis, or edema Skin: No rashes or lesions Neuro: Non-focal Breasts: small approximately 1cm max in diameter lesion of open skin approximately at 6 o'clock under nipple.  Tissue is without tracking, erythema, red granulation and healing tissue is present.   ECOG PERFORMANCE STATUS: 0 - Asymptomatic   LABORATORY DATA: Lab Results  Component Value Date   WBC 9.9 02/21/2013   HGB 10.7* 02/21/2013   HCT 33.1* 02/21/2013   MCV 91.9 02/21/2013  PLT 197 02/21/2013      Chemistry      Component Value Date/Time   NA 142 02/21/2013 1330   NA 136 11/04/2012 0637   K 4.2 02/21/2013 1330   K 3.5 11/04/2012 0637   CL 109* 02/21/2013 1330   CL 103 11/04/2012 0637   CO2 24 02/21/2013 1330   CO2 22 11/04/2012 0637   BUN 9.5 02/21/2013 1330   BUN 6 11/04/2012 0637   CREATININE 0.8 02/21/2013 1330   CREATININE 0.59 11/04/2012 0637      Component Value Date/Time   CALCIUM 8.8 02/21/2013 1330   CALCIUM 8.4 11/04/2012 0637   ALKPHOS 158* 02/21/2013 1330   AST 15 02/21/2013 1330   ALT 13 02/21/2013 1330   BILITOT <0.20 Repeated and Verified 02/21/2013 1330       RADIOGRAPHIC STUDIES:  Dg Chest Port 1 View  12/08/2012  *RADIOLOGY REPORT*  Clinical Data: Post line placement.  Rule out  pneumothorax.  The  PORTABLE CHEST - 1 VIEW  Comparison: 11/25/2012 PET CT.  10/26/2012 chest x-ray.  Findings: Curvilinear appearance right apex along the undersurface of the posterior aspect of the right second rib probably related to rib rather than pneumothorax.   This can be assessed on follow-up examination if there are any progressive symptoms to suggest pneumothorax.  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  Cardiomegaly.  Mild central pulmonary vascular prominence.  Prior left breast surgery and left axillary lymph node dissection.  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  No definitive pneumothorax.  Curvilinear structure right lung apex probably associated with the undersurface of rib as noted above.  This is a call report.   Original Report Authenticated By: Lacy Duverney, M.D.    Dg C-arm 1-60 Min-no Report  12/08/2012  CLINICAL DATA: port-a-cath insertion   C-ARM 1-60 MINUTES  Fluoroscopy was utilized by the requesting physician.  No radiographic  interpretation.      ASSESSMENT: 47 year old female with   #1stage III a( T3 N1) invasive ductal carcinoma she is status post mastectomy with axillary lymph node dissection with one of 12 lymph nodes positive for metastatic disease. Tumor was ER negative PR negative HER-2/neu negative. She had her mastectomy on 11/03/2012 with axillary lymph node dissection performed on 12/08/2012 and she had a Port-A-Cath placed at that time as well. She should have no evidence of metastatic disease elsewhere distally.  #2 patient is a good candidate for adjuvant chemotherapy. She will proceed with FEC every 2 weeks with day 2 Neulasta for 4 cycles. After that is completed then she will receive Taxol and carboplatinum on a weekly basis for 12 weeks. I discussed the rationale for this.  #3 patient does need echocardiogram and I will get this scheduled. She already has had chemotherapy teaching class.  #4 at some point I will also  refer her back to the genetic counselor for genetic testing-she declined this at her previous appt.    PLAN:   #1 Ms Arciniega labs are stable. She will proceed with chemotherapy today.  She will continue to dress the lesion with a bandaid and we will prescribe Keflex for her x 10 days.    #2 I will see her back next week for labs, appt.   All questions were answered. The patient knows to call the clinic with any problems, questions or concerns. We can certainly see the patient much sooner if necessary.  I spent 25 minutes counseling the patient face to face.  The total time spent in the appointment was 30 minutes.  This case was reviewed with Dr. Welton Flakes.   Cherie Ouch Lyn Hollingshead, NP Medical Oncology [facility] Phone: [phone]

## 2013-02-21 NOTE — Patient Instructions (Addendum)
Doing well.  Proceed with chemotherapy.    Cephalexin tablets or capsules What is this medicine? CEPHALEXIN (sef a LEX in) is a cephalosporin antibiotic. It is used to treat certain kinds of bacterial infections It will not work for colds, flu, or other viral infections. This medicine may be used for other purposes; ask your health care provider or pharmacist if you have questions. What should I tell my health care provider before I take this medicine? They need to know if you have any of these conditions: -kidney disease -stomach or intestine problems, especially colitis -an unusual or allergic reaction to cephalexin, other cephalosporins, penicillins, other antibiotics, medicines, foods, dyes or preservatives -pregnant or trying to get pregnant -breast-feeding How should I use this medicine? Take this medicine by mouth with a full glass of water. Follow the directions on the prescription label. This medicine can be taken with or without food. Take your medicine at regular intervals. Do not take your medicine more often than directed. Take all of your medicine as directed even if you think you are better. Do not skip doses or stop your medicine early. Talk to your pediatrician regarding the use of this medicine in children. While this drug may be prescribed for selected conditions, precautions do apply. Overdosage: If you think you have taken too much of this medicine contact a poison control center or emergency room at once. NOTE: This medicine is only for you. Do not share this medicine with others. What if I miss a dose? If you miss a dose, take it as soon as you can. If it is almost time for your next dose, take only that dose. Do not take double or extra doses. There should be at least 4 to 6 hours between doses. What may interact with this medicine? -probenecid -some other antibiotics This list may not describe all possible interactions. Give your health care provider a list of all the  medicines, herbs, non-prescription drugs, or dietary supplements you use. Also tell them if you smoke, drink alcohol, or use illegal drugs. Some items may interact with your medicine. What should I watch for while using this medicine? Tell your doctor or health care professional if your symptoms do not begin to improve in a few days. Do not treat diarrhea with over the counter products. Contact your doctor if you have diarrhea that lasts more than 2 days or if it is severe and watery. If you have diabetes, you may get a false-positive result for sugar in your urine. Check with your doctor or health care professional. What side effects may I notice from receiving this medicine? Side effects that you should report to your doctor or health care professional as soon as possible: -allergic reactions like skin rash, itching or hives, swelling of the face, lips, or tongue -breathing problems -pain or trouble passing urine -redness, blistering, peeling or loosening of the skin, including inside the mouth -severe or watery diarrhea -unusually weak or tired -yellowing of the eyes, skin Side effects that usually do not require medical attention (report to your doctor or health care professional if they continue or are bothersome): -gas or heartburn -genital or anal irritation -headache -joint or muscle pain -nausea, vomiting This list may not describe all possible side effects. Call your doctor for medical advice about side effects. You may report side effects to FDA at 1-800-FDA-1088. Where should I keep my medicine? Keep out of the reach of children. Store at room temperature between 59 and 86 degrees F (  15 and 30 degrees C). Throw away any unused medicine after the expiration date. NOTE: This sheet is a summary. It may not cover all possible information. If you have questions about this medicine, talk to your doctor, pharmacist, or health care provider.  2013, [employer]. (03/05/2008  5:09:13 PM)

## 2013-02-22 ENCOUNTER — Ambulatory Visit (HOSPITAL_BASED_OUTPATIENT_CLINIC_OR_DEPARTMENT_OTHER): Payer: 59

## 2013-02-22 ENCOUNTER — Ambulatory Visit: Payer: 59 | Admitting: *Deleted

## 2013-02-22 VITALS — BP 139/78 | HR 90 | Temp 98.4°F

## 2013-02-22 DIAGNOSIS — Z5189 Encounter for other specified aftercare: Secondary | ICD-10-CM

## 2013-02-22 DIAGNOSIS — C50419 Malignant neoplasm of upper-outer quadrant of unspecified female breast: Secondary | ICD-10-CM

## 2013-02-22 MED ORDER — PEGFILGRASTIM INJECTION 6 MG/0.6ML
6.0000 mg | Freq: Once | SUBCUTANEOUS | Status: AC
  Administered 2013-02-22: 6 mg via SUBCUTANEOUS
  Filled 2013-02-22: qty 0.6

## 2013-02-24 ENCOUNTER — Ambulatory Visit: Payer: 59

## 2013-02-24 ENCOUNTER — Telehealth (INDEPENDENT_AMBULATORY_CARE_PROVIDER_SITE_OTHER): Payer: Self-pay

## 2013-02-24 ENCOUNTER — Telehealth: Payer: Self-pay | Admitting: *Deleted

## 2013-02-24 ENCOUNTER — Encounter (INDEPENDENT_AMBULATORY_CARE_PROVIDER_SITE_OTHER): Payer: Self-pay

## 2013-02-24 NOTE — Telephone Encounter (Signed)
Patient will pick up return to work release Monday 02/27/13

## 2013-02-24 NOTE — Telephone Encounter (Signed)
sw pt. pt rerquested that i call right back and lm on her vm. Pt is aware of appt d/t on 03/01/13 with a 2pm lab and 2:30pm visit w/kk.

## 2013-02-25 ENCOUNTER — Telehealth: Payer: Self-pay | Admitting: Oncology

## 2013-02-25 NOTE — Telephone Encounter (Signed)
3/19 appt moved to AM due to KK PM [facility]. S/w pt she is aware.  °

## 2013-02-27 ENCOUNTER — Ambulatory Visit (HOSPITAL_COMMUNITY)
Admission: RE | Admit: 2013-02-27 | Discharge: 2013-02-27 | Disposition: A | Discharge status: Home / Self Care | Payer: 59 | Source: Ambulatory Visit | Attending: Oncology | Admitting: Oncology

## 2013-02-27 ENCOUNTER — Other Ambulatory Visit: Payer: Self-pay | Admitting: Physician Assistant

## 2013-02-27 ENCOUNTER — Telehealth: Payer: Self-pay | Admitting: *Deleted

## 2013-02-27 ENCOUNTER — Ambulatory Visit: Payer: 59 | Admitting: Physician Assistant

## 2013-02-27 ENCOUNTER — Ambulatory Visit (HOSPITAL_BASED_OUTPATIENT_CLINIC_OR_DEPARTMENT_OTHER): Payer: 59 | Admitting: Physician Assistant

## 2013-02-27 ENCOUNTER — Other Ambulatory Visit: Payer: Self-pay | Admitting: Emergency Medicine

## 2013-02-27 ENCOUNTER — Encounter: Payer: Self-pay | Admitting: Physician Assistant

## 2013-02-27 ENCOUNTER — Telehealth: Payer: Self-pay | Admitting: Emergency Medicine

## 2013-02-27 ENCOUNTER — Ambulatory Visit: Payer: 59 | Admitting: Physical Therapy

## 2013-02-27 VITALS — BP 152/88 | HR 88 | Temp 98.7°F | Resp 20 | Ht 64.0 in | Wt 215.4 lb

## 2013-02-27 DIAGNOSIS — M79609 Pain in unspecified limb: Secondary | ICD-10-CM

## 2013-02-27 DIAGNOSIS — C50419 Malignant neoplasm of upper-outer quadrant of unspecified female breast: Secondary | ICD-10-CM | POA: Insufficient documentation

## 2013-02-27 DIAGNOSIS — C50412 Malignant neoplasm of upper-outer quadrant of left female breast: Secondary | ICD-10-CM

## 2013-02-27 DIAGNOSIS — M7989 Other specified soft tissue disorders: Secondary | ICD-10-CM

## 2013-02-27 DIAGNOSIS — I82402 Acute embolism and thrombosis of unspecified deep veins of left lower extremity: Secondary | ICD-10-CM | POA: Insufficient documentation

## 2013-02-27 DIAGNOSIS — I82409 Acute embolism and thrombosis of unspecified deep veins of unspecified lower extremity: Secondary | ICD-10-CM

## 2013-02-27 DIAGNOSIS — C773 Secondary and unspecified malignant neoplasm of axilla and upper limb lymph nodes: Secondary | ICD-10-CM

## 2013-02-27 DIAGNOSIS — Z171 Estrogen receptor negative status [ER-]: Secondary | ICD-10-CM

## 2013-02-27 MED ORDER — ENOXAPARIN SODIUM 150 MG/ML ~~LOC~~ SOLN: 145.0000 mg | Freq: Every day | SUBCUTANEOUS | Status: DC

## 2013-02-27 MED ORDER — ENOXAPARIN SODIUM 150 MG/ML ~~LOC~~ SOLN
145.0000 mg | Freq: Once | SUBCUTANEOUS | Status: AC
  Administered 2013-02-27: 145 mg via SUBCUTANEOUS

## 2013-02-27 MED ORDER — WARFARIN SODIUM 5 MG PO TABS: ORAL_TABLET | ORAL | Status: DC

## 2013-02-27 NOTE — Patient Instructions (Signed)
You have a DVT (which is a blood clot) in your left leg.  The treatment for this is blood thinners - initially with injections of Lovenox, then orally with Coumadin.  You will receive your first daily injection of Lovenox today, 02/27/2013.  Tomorrow you will receive an injection, but will also start on oral Coumadin at 5 mg by mouth at night.  You will continue with both the injection AND the pill until your blood is therapeutic.  (This means your INR is between 2.0 and 3.0.  This is a blood test drawn here in our office).  You will THEN stop the injections per our instructions, but will continue the oral medication.  We will continue to check your INR level regularly and will adjust your dose of Coumadin accordingly to make sure your blood doesn't get too thin or too thick.  Do not increase your intake of dark leafy vegetables which are high in vitamin K.  This will thicken your blood.    Do not massage the leg.  If the leg becomes more painful, call us at [phone]  If you have a sudden increase in shortness of breath, experience difficulty taking a deep breath, or have chest pain call 911 or go to the Emergency Dept for further evaluation.

## 2013-02-27 NOTE — Telephone Encounter (Signed)
[place] called reporting pt. Is positive for DVT.  Pt. En route to [facility].  Will notify providers.

## 2013-02-27 NOTE — Progress Notes (Signed)
VASCULAR LAB PRELIMINARY  PRELIMINARY  PRELIMINARY  PRELIMINARY  Left lower extremity venous duplex completed.    Preliminary report:  Positive for DVT of the left lower extremity coursing from the posterior tibial vein through the popliteal vein. There is no evidence of superficial thrombosis or Baker's cyst. There is no propagation to the right side at this time.  Nicky Milhouse, RVS 02/27/2013, 3:33 PM

## 2013-02-27 NOTE — Telephone Encounter (Signed)
Message received from Micheline Maze from Lymphedema clinic; patient in today for therapy. Per Lupita Leash, patient presents with complaints of left lower extremity swelling and calf pain. Per Lupita Leash left lower extremity is firmer than right. States negative Homan's sign.

## 2013-02-27 NOTE — Telephone Encounter (Signed)
Ok per AGB to put pt in 3:15 slot, due to i do not have override access.

## 2013-02-27 NOTE — Addendum Note (Signed)
Addended by: Lorenza Evangelist A on: 02/27/2013 06:02 PM   Modules accepted: Orders

## 2013-02-27 NOTE — Progress Notes (Signed)
OFFICE PROGRESS NOTE  CC Dr. Ovidio Kin Dr. 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 Pierce Crane Dr. Ovidio Kin and Dr. Lurline Hare.Patient originally felt lumpiness in her left breast. This prompted a mammogram.Her mammogram was performed on 08/25/2012 showed pleomorphic calcifications of the entire upper outer left breast measuring 11 x 12 x 15 cm. On exam she had a for a multinodular mass like area over the left upper quadrant. Ultrasound confirmed multiple ill-defined hypoechoic areas. Ultrasound of the left axilla showed a few abnormal appearing lymph nodes. Biopsy of the lymph nodes and the breast was performed on 09/01/2012. 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 The ER was 2% PR 0%.   #2Patient was recommended A mastectomy. 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 Lymphovascular invasion was identified all resection margins were negative the 2 sentinel nodes were removed one was positive for metastatic disease. Patient then went on to have a full axillary lymph node dissection performed the pathology showed 10 lymph nodes negative for metastatic disease therefore 1 of 12 lymph nodes were positive. Final pathologic staging was T T3 N1. Tumor was ER negative PR negative HER-2/neu negative.  #3 patient did have genetic counseling performed but she declined genetic testing at that time. We discussed genetic counseling and testing again in patient is now agreeable and we will get this taken care of.  #4 patient was seen by  Dr. Pierce Crane on 11/17/2012 he recommended PET scan which was negative for metastatic disease. He also recommended adjuvant chemotherapy consisting of FEC given every 2 weeks for a total of 4 cycles. I have recommended Weekly Taxol and carboplatinum For a total of 12 weeks after completion of FEC.  CURRENT THERAPY: Cycle 4 day 7 of FEC  INTERVAL HISTORY: Hannah Hale 47 y.o. female returns for evaluation today, in between normal followup visits. She just completed her fourth and final dose of  FEC one week ago.  She was seen in the lymphedema clinic today, and we were contacted by her therapist, Micheline Maze, reporting that the patient was presenting with complaints of left lower extremity swelling and pain. The patient tells me she has had some swelling now for about 2 weeks, and the left leg has been sore for 2-3 weeks. A venous Doppler of the left lower extremity confirmed a DVT  from the posterior tibial vein through the popliteal vein today. Fortunately, she denies any new cough, shortness of breath, or chest pain.   MEDICAL HISTORY: Past Medical History  Diagnosis Date  . Coughing     cold recent   . Breast cancer     left, Sept 20 2013    ALLERGIES:  has No Known Allergies.  MEDICATIONS:  Current Outpatient Prescriptions  Medication Sig Dispense Refill  . azithromycin (ZITHROMAX Z-PAK) 250 MG tablet Take 2 tablets by mouth on day one, then 1 tablet daily till complete.  6 each  0  . cephALEXin (KEFLEX) 500 MG capsule Take 1 capsule (500 mg total) by mouth 4 (four) times daily.  40 capsule  0  .  dexamethasone (DECADRON) 4 MG tablet Take 2 tablets by mouth once a day on the day after chemotherapy and then take 2 tablets two times a day for 2 days. Take with food.  30 tablet  1  . diphenhydrAMINE (SOMINEX) 25 MG tablet Take 50 mg by mouth 2 (two) times daily as needed. For itching      . HYDROcodone-acetaminophen (NORCO/VICODIN) 5-325 MG per tablet Take 1-2 tablets by mouth  every 6 (six) hours as needed for pain.  30 tablet  1  . lidocaine-prilocaine (EMLA) cream Apply topically as needed.  30 g  6  . LORazepam (ATIVAN) 0.5 MG tablet Take 1 tablet (0.5 mg total) by mouth every 6 (six) hours as needed (Nausea or vomiting).  30 tablet  0  . ondansetron (ZOFRAN) 8 MG tablet Take 1 tablet (8 mg total) by mouth 2 (two) times daily as needed. Take two times a day as needed for nausea or vomiting starting on the third day after chemotherapy.  30 tablet  1  . prochlorperazine (COMPAZINE) 10 MG tablet Take 1 tablet (10 mg total) by mouth every 6 (six) hours as needed (Nausea or vomiting).  30 tablet  1  . prochlorperazine (COMPAZINE) 25 MG suppository Place 1 suppository (25 mg total) rectally every 12 (twelve) hours as needed for nausea.  12 suppository  3  . warfarin (COUMADIN) 5 MG tablet Take 1-2 tablets by mouth daily as directed.  60 tablet  1   No current facility-administered medications for this visit.    SURGICAL HISTORY:  Past Surgical History  Procedure Laterality Date  . Simple mastectomy w/ sentinel node biopsy  11/03/2012  . Simple mastectomy with axillary sentinel node biopsy  11/03/2012    Procedure: SIMPLE MASTECTOMY WITH AXILLARY SENTINEL NODE BIOPSY;  Surgeon: Kandis Cocking, MD;  Location: [facility];  Service: General;  Laterality: Left;  . Portacath placement  12/08/2012    Procedure: INSERTION PORT-A-CATH;  Surgeon: Kandis Cocking, MD;  Location: [facility];  Service: General;  Laterality: N/A;  Power Port Placment and Left Axillary Node Dissection  . Axillary lymph node dissection  12/08/2012    Procedure: AXILLARY LYMPH NODE DISSECTION;  Surgeon: Kandis Cocking, MD;  Location: [facility];  Service: General;  Laterality: Left;    REVIEW OF SYSTEMS:  General: fatigue (-), night sweats (-), fever (-), pain (-) Lymph: palpable nodes (-) HEENT: vision changes (-), mucositis (-), gum bleeding (-), epistaxis (-) Cardiovascular: chest pain (-), palpitations  (-) Pulmonary: shortness of breath (-), dyspnea on exertion (-), cough (-), hemoptysis (-) GI:  Early satiety (-), melena (-), dysphagia (-), nausea/vomiting (-), diarrhea (-) GU: dysuria (-), hematuria (-), incontinence (-) Musculoskeletal: joint swelling (-), joint pain (-), back pain (-) Neuro: weakness (-), numbness (-), headache (-), confusion (-), lower extremity edema (+) Skin: Rash (-), lesions (-), dryness (-) Psych: depression (-), suicidal/homicidal ideation (-), feeling of hopelessness (-)  PHYSICAL EXAMINATION: Blood pressure 152/88, pulse 88, temperature 98.7 F (37.1 C), temperature source Oral, resp. rate 20, height 5\' 4"  (1.626 m), weight 215 lb 6.4 oz (97.705 kg). Body mass index is 36.96 kg/(m^2). General: Patient is a well appearing female who appears slightly nervous, but is in no acute distress.  A truncated physical exam was performed.  Lungs: clear to auscultation bilaterally, no wheezes, rhonchi, or rales Cardiovascular: regular rate rhythm, S1, S2, no murmurs, rubs or gallops Abdomen: Soft, non-tender, non-distended, normoactive bowel sounds Extremities: warm and well perfused, no clubbing,  or cyanosis, 1+ pitting edema in the left lower extremity, but no erythema or palpable cords. No edema in the right lower extremity. Neuro: Non-focal, well oriented   ECOG PERFORMANCE STATUS: 0 - Asymptomatic   LABORATORY DATA: Lab Results  Component Value Date   WBC 9.9 02/21/2013   HGB 10.7* 02/21/2013   HCT 33.1* 02/21/2013   MCV 91.9 02/21/2013   PLT 197 02/21/2013      Chemistry      Component Value Date/Time   NA 142 02/21/2013 1330   NA 136 11/04/2012 0637   K 4.2 02/21/2013 1330   K 3.5 11/04/2012 0637   CL 109* 02/21/2013 1330   CL 103 11/04/2012 0637   CO2 24 02/21/2013 1330   CO2 22 11/04/2012 0637   BUN 9.5 02/21/2013 1330   BUN 6 11/04/2012 0637   CREATININE 0.8 02/21/2013 1330   CREATININE 0.59 11/04/2012 0637      Component Value Date/Time   CALCIUM  8.8 02/21/2013 1330   CALCIUM 8.4 11/04/2012 0637   ALKPHOS 158* 02/21/2013 1330   AST 15 02/21/2013 1330   ALT 13 02/21/2013 1330   BILITOT <0.20 Repeated and Verified 02/21/2013 1330       RADIOGRAPHIC STUDIES:  Preliminary report from the vascular lab today, 02/27/2013, shows that the left lower extremity venous Doppler was positive for DVT in the left lower extremity, coursing from the posterior tibial vein through the popliteal vein. There is no evidence of superficial thrombosis or Baker's cyst. There is no propagation to the right side at this time.   Dg Chest Port 1 View  12/08/2012  *RADIOLOGY REPORT*  Clinical Data: Post line placement.  Rule out pneumothorax.  The  PORTABLE CHEST - 1 VIEW  Comparison: 11/25/2012 PET CT.  10/26/2012 chest x-ray.  Findings: Curvilinear appearance right apex along the undersurface of the posterior aspect of the right second rib probably related to rib rather than pneumothorax.   This can be assessed on follow-up examination if there are any progressive symptoms to suggest pneumothorax.  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  Cardiomegaly.  Mild central pulmonary vascular prominence.  Prior left breast surgery and left axillary lymph node dissection.  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  No definitive pneumothorax.  Curvilinear structure right lung apex probably associated with the undersurface of rib as noted above.  This is a call report.   Original Report Authenticated By: Lacy Duverney, M.D.    Dg C-arm 1-60 Min-no Report  12/08/2012  CLINICAL DATA: port-a-cath insertion   C-ARM 1-60 MINUTES  Fluoroscopy was utilized by the requesting physician.  No radiographic  interpretation.      ASSESSMENT: 47 year old female with   #1stage III a( T3 N1) invasive ductal carcinoma she is status post mastectomy with axillary lymph node dissection with one of 12 lymph nodes positive for metastatic disease. Tumor was ER negative PR  negative HER-2/neu negative. She had her mastectomy on 11/03/2012 with axillary lymph node dissection performed on 12/08/2012 and she had a Port-A-Cath placed at that time as well. She should have no evidence of metastatic disease elsewhere distally.  #2 patient is a good candidate for adjuvant chemotherapy. She will proceed with FEC every 2 weeks with day 2 Neulasta for 4 cycles. After that is completed then she will receive Taxol and carboplatinum on a weekly basis for 12 weeks. I discussed the rationale for this.  #3 patient does need echocardiogram and I will get this  scheduled. She already has had chemotherapy teaching class.  #4 at some point I will also refer her back to the genetic counselor for genetic testing-she declined this at her previous appt.   #5 DVT of the left lower extremity, diagnosed on 02/27/2013   PLAN:   #1 Ms Hill appears to be stable, with no complaints today associated with her recent chemotherapy.  #2 She will be seen back in the office for labs and a followup visit on Wednesday, March 19 for followup with regards to chemotoxicity, day 9 cycle 4 of FEC.  #5  This case was reviewed with Dr. Welton Flakes.  The patient was given written information regarding her diagnosis of DVT, along with information regarding expected treatment. She will receive her first Lovenox injection today (1.5 mg per kilogram equals 145 mg daily) and will continue with Lovenox injections on a daily basis. She will start on oral Coumadin tomorrow night, initially at a dose of 5 mg nightly. She'll continue on both injections and oral medication until her INR is therapeutic between 2.0 and 3.0. At that point, we will discontinue the Lovenox, but continue on Coumadin. She is being scheduled to return on Friday, March 21, for a repeat PT/INR and an evaluation through our Coumadin clinic.   All questions were answered. The patient knows to call the clinic with any problems, questions or concerns. We can  certainly see the patient much sooner if necessary.  I will note that we discussed the risk of pulmonary embolus, and she knows to proceed to the emergency room immediately with any sudden onset of shortness of breath, difficulty with deep inspiration, or chest pain.  I spent 25 minutes counseling the patient face to face. The total time spent in the appointment was 30 minutes.    Dillard Essex 02/27/2013 Medical Oncology [facility] Phone: [phone]

## 2013-02-28 ENCOUNTER — Ambulatory Visit: Payer: 59

## 2013-03-01 ENCOUNTER — Ambulatory Visit: Payer: 59 | Admitting: Physical Therapy

## 2013-03-01 ENCOUNTER — Ambulatory Visit (HOSPITAL_BASED_OUTPATIENT_CLINIC_OR_DEPARTMENT_OTHER): Payer: 59 | Admitting: Oncology

## 2013-03-01 ENCOUNTER — Encounter: Payer: Self-pay | Admitting: Oncology

## 2013-03-01 ENCOUNTER — Other Ambulatory Visit (HOSPITAL_BASED_OUTPATIENT_CLINIC_OR_DEPARTMENT_OTHER): Payer: 59 | Admitting: Lab

## 2013-03-01 VITALS — BP 152/85 | HR 98 | Temp 97.1°F | Resp 20 | Ht 64.0 in | Wt 212.8 lb

## 2013-03-01 DIAGNOSIS — C50412 Malignant neoplasm of upper-outer quadrant of left female breast: Secondary | ICD-10-CM

## 2013-03-01 DIAGNOSIS — C50919 Malignant neoplasm of unspecified site of unspecified female breast: Secondary | ICD-10-CM

## 2013-03-01 DIAGNOSIS — Z171 Estrogen receptor negative status [ER-]: Secondary | ICD-10-CM

## 2013-03-01 DIAGNOSIS — I82409 Acute embolism and thrombosis of unspecified deep veins of unspecified lower extremity: Secondary | ICD-10-CM

## 2013-03-01 DIAGNOSIS — I82402 Acute embolism and thrombosis of unspecified deep veins of left lower extremity: Secondary | ICD-10-CM

## 2013-03-01 DIAGNOSIS — Z9010 Acquired absence of unspecified breast and nipple: Secondary | ICD-10-CM

## 2013-03-01 LAB — CBC WITH DIFFERENTIAL/PLATELET
BASO%: 0.9 % (ref 0.0–2.0)
EOS%: 0.5 % (ref 0.0–7.0)
LYMPH%: 16.8 % (ref 14.0–49.7)
MCH: 30.6 pg (ref 25.1–34.0)
MCHC: 33.8 g/dL (ref 31.5–36.0)
MONO#: 0.5 10*3/uL (ref 0.1–0.9)
MONO%: 8.1 % (ref 0.0–14.0)
Platelets: 205 10*3/uL (ref 145–400)
RBC: 3.38 10*6/uL — ABNORMAL LOW (ref 3.70–5.45)
WBC: 6.3 10*3/uL (ref 3.9–10.3)

## 2013-03-01 LAB — PROTIME-INR: Protime: 12 Seconds (ref 10.6–13.4)

## 2013-03-01 LAB — COMPREHENSIVE METABOLIC PANEL (CC13)
Alkaline Phosphatase: 182 U/L — ABNORMAL HIGH (ref 40–150)
BUN: 7.7 mg/dL (ref 7.0–26.0)
CO2: 23 mEq/L (ref 22–29)
Creatinine: 0.7 mg/dL (ref 0.6–1.1)
Glucose: 168 mg/dl — ABNORMAL HIGH (ref 70–99)
Sodium: 141 mEq/L (ref 136–145)
Total Bilirubin: <0.2 mg/dL (ref 0.20–1.20)

## 2013-03-01 NOTE — Progress Notes (Signed)
OFFICE PROGRESS NOTE  CC Dr. Ovidio Kin Dr. 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 Pierce Crane Dr. Ovidio Kin and Dr. Lurline Hare.Patient originally felt lumpiness in her left breast. This prompted a mammogram.Her mammogram was performed on 08/25/2012 showed pleomorphic calcifications of the entire upper outer left breast measuring 11 x 12 x 15 cm. On exam she had a for a multinodular mass like area over the left upper quadrant. Ultrasound confirmed multiple ill-defined hypoechoic areas. Ultrasound of the left axilla showed a few abnormal appearing lymph nodes. Biopsy of the lymph nodes and the breast was performed on 09/01/2012. 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 The ER was 2% PR 0%.   #2Patient was recommended A mastectomy. 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 Lymphovascular invasion was identified all resection margins were negative the 2 sentinel nodes were removed one was positive for metastatic disease. Patient then went on to have a full axillary lymph node dissection performed the pathology showed 10 lymph nodes negative for metastatic disease therefore 1 of 12 lymph nodes were positive. Final pathologic staging was T T3 N1. Tumor was ER negative PR negative HER-2/neu negative.  #3 patient did have genetic counseling performed but she declined genetic testing at that time. We discussed genetic counseling and testing again in patient is now agreeable and we will get this taken care of.  #4 patient was seen by  Dr. Pierce Crane on 11/17/2012 he recommended PET scan which was negative for metastatic disease. He also recommended adjuvant chemotherapy consisting of FEC given every 2 weeks for a total of 4 cycles. She has now completed the [facility] as of end of March 2014.  #5 on 03/17/2013 patient will begin adjuvant Taxol carboplatinum weekly for a total of 12 weeks.  #5 patient with left lower extremity DVT she is on Lovenox and Coumadin  CURRENT THERAPY: Taxol carboplatinum to begin on 03/17/2013  INTERVAL HISTORY: Hannah Hale 47 y.o. female returns for followup visit today. She was seen 2 days ago with development of left lower extremity pain and subsequent Doppler study revealed a DVT. She was recommended Lovenox and Coumadin. Unfortunately patient did not pick up her Lovenox injections from the pharmacy. She will do so today. She is encouraged to begin these. Otherwise she is doing well she has no fevers chills or night sweats no headaches no shortness of breath no chest pains or palpitations. Remainder of the 10 point review of systems is negative.     MEDICAL HISTORY: Past Medical History  Diagnosis Date  . Coughing     cold recent   . Breast cancer     left, Sept 20 2013    ALLERGIES:  has No Known Allergies.  MEDICATIONS:  Current Outpatient Prescriptions  Medication Sig Dispense Refill  . diphenhydrAMINE (SOMINEX) 25 MG tablet Take 50 mg by mouth 2 (two) times daily as needed. For itching      . cephALEXin (KEFLEX) 500 MG capsule Take 1 capsule (500 mg total) by mouth 4 (four) times daily.  40 capsule  0  .  dexamethasone (DECADRON) 4 MG tablet Take 2 tablets by mouth once a day on the day after chemotherapy and then take 2 tablets two times a day for 2 days. Take with food.  30 tablet  1  . enoxaparin (LOVENOX) 150 MG/ML injection Inject 0.97 mLs (145 mg total) into the skin daily.  10 Syringe  0  . HYDROcodone-acetaminophen (NORCO/VICODIN) 5-325 MG per tablet Take 1-2 tablets by mouth  every 6 (six) hours as needed for pain.  30 tablet  1  . lidocaine-prilocaine (EMLA) cream Apply topically as needed.  30 g  6  . LORazepam (ATIVAN) 0.5 MG tablet Take 1 tablet (0.5 mg total) by mouth every 6 (six) hours as needed (Nausea or vomiting).  30 tablet  0  . ondansetron (ZOFRAN) 8 MG tablet Take 1 tablet (8 mg total) by mouth 2 (two) times daily as needed. Take two times a day as needed for nausea or vomiting starting on the third day after chemotherapy.  30 tablet  1  . prochlorperazine (COMPAZINE) 10 MG tablet Take 1 tablet (10 mg total) by mouth every 6 (six) hours as needed (Nausea or vomiting).  30 tablet  1  . prochlorperazine (COMPAZINE) 25 MG suppository Place 1 suppository (25 mg total) rectally every 12 (twelve) hours as needed for nausea.  12 suppository  3  . warfarin (COUMADIN) 5 MG tablet Take 1-2 tablets by mouth daily as directed.  60 tablet  1   No current facility-administered medications for this visit.    SURGICAL HISTORY:  Past Surgical History  Procedure Laterality Date  . Simple mastectomy w/ sentinel node biopsy  11/03/2012  . Simple mastectomy with axillary sentinel node biopsy  11/03/2012    Procedure: SIMPLE MASTECTOMY WITH AXILLARY SENTINEL NODE BIOPSY;  Surgeon: Kandis Cocking, MD;  Location: [facility] OR;  Service: General;  Laterality: Left;  . Portacath placement  12/08/2012    Procedure: INSERTION PORT-A-CATH;  Surgeon: Kandis Cocking, MD;  Location: [facility];  Service: General;  Laterality: N/A;  Power Port Placment and Left Axillary Node Dissection  . Axillary lymph node dissection  12/08/2012    Procedure: AXILLARY LYMPH NODE DISSECTION;  Surgeon: Kandis Cocking, MD;  Location: [facility];  Service: General;  Laterality: Left;    REVIEW OF SYSTEMS:  General: fatigue (-), night sweats (-), fever (-), pain (-) Lymph: palpable nodes (-) HEENT: vision changes (-), mucositis (-), gum bleeding (-), epistaxis (-) Cardiovascular: chest pain (-), palpitations  (-) Pulmonary: shortness of breath (-), dyspnea on exertion (-), cough (-), hemoptysis (-) GI:  Early satiety (-), melena (-), dysphagia (-), nausea/vomiting (-), diarrhea (-) GU: dysuria (-), hematuria (-), incontinence (-) Musculoskeletal: joint swelling (-), joint pain (-), back pain (-) Neuro: weakness (-), numbness (-), headache (-), confusion (-) Skin: Rash (-), lesions (+), dryness (-) Psych: depression (-), suicidal/homicidal ideation (-), feeling of hopelessness (-)  PHYSICAL EXAMINATION: Blood pressure 152/85, pulse 98, temperature 97.1 F (36.2 C), temperature source Oral, resp. rate 20, height 5\' 4"  (1.626 m), weight 212 lb 12.8 oz (96.525 kg). Body mass index is 36.51 kg/(m^2). General: Patient is a well appearing female in no acute distress HEENT: PERRLA, sclerae anicteric no conjunctival pallor, MMM Neck: supple, no palpable adenopathy Lungs: clear to auscultation bilaterally, no wheezes, rhonchi, or rales Cardiovascular: regular rate rhythm, S1, S2, no murmurs, rubs or gallops Abdomen: Soft, non-tender, non-distended, normoactive bowel sounds, no HSM Extremities: warm and well perfused, no clubbing, cyanosis, or edema Skin: No  rashes or lesions Neuro: Non-focal Breasts: small approximately 1cm max in diameter lesion of open skin approximately at 6 o'clock under nipple.  Tissue is without tracking, erythema, red granulation and healing tissue is present.   ECOG PERFORMANCE STATUS: 0 - Asymptomatic   LABORATORY DATA: Lab Results  Component Value Date   WBC 6.3 03/01/2013   HGB 10.3* 03/01/2013   HCT 30.6* 03/01/2013   MCV 90.4 03/01/2013   PLT 205 03/01/2013      Chemistry      Component Value Date/Time   NA 141 03/01/2013 0958   NA 136 11/04/2012 0637   K 3.8 03/01/2013 0958   K 3.5 11/04/2012 0637   CL 107 03/01/2013 0958   CL 103 11/04/2012 0637   CO2 23 03/01/2013 0958   CO2 22 11/04/2012 0637   BUN 7.7 03/01/2013 0958   BUN 6 11/04/2012 0637   CREATININE 0.7  03/01/2013 0958   CREATININE 0.59 11/04/2012 0637      Component Value Date/Time   CALCIUM 9.2 03/01/2013 0958   CALCIUM 8.4 11/04/2012 0637   ALKPHOS 182* 03/01/2013 0958   AST 10 03/01/2013 0958   ALT 9 03/01/2013 0958   BILITOT <0.20 Repeated and Verified 03/01/2013 0958       RADIOGRAPHIC STUDIES:  Dg Chest Port 1 View  12/08/2012  *RADIOLOGY REPORT*  Clinical Data: Post line placement.  Rule out pneumothorax.  The  PORTABLE CHEST - 1 VIEW  Comparison: 11/25/2012 PET CT.  10/26/2012 chest x-ray.  Findings: Curvilinear appearance right apex along the undersurface of the posterior aspect of the right second rib probably related to rib rather than pneumothorax.   This can be assessed on follow-up examination if there are any progressive symptoms to suggest pneumothorax.  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  Cardiomegaly.  Mild central pulmonary vascular prominence.  Prior left breast surgery and left axillary lymph node dissection.  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  No definitive pneumothorax.  Curvilinear structure right lung apex probably associated with the undersurface of rib as noted above.  This is a call report.   Original Report Authenticated By: Lacy Duverney, M.D.    Dg C-arm 1-60 Min-no Report  12/08/2012  CLINICAL DATA: port-a-cath insertion   C-ARM 1-60 MINUTES  Fluoroscopy was utilized by the requesting physician.  No radiographic  interpretation.      ASSESSMENT: 47 year old female with   #1stage III a( T3 N1) invasive ductal carcinoma she is status post mastectomy with axillary lymph node dissection with one of 12 lymph nodes positive for metastatic disease. Tumor was ER negative PR negative HER-2/neu negative. She had her mastectomy on 11/03/2012 with axillary lymph node dissection performed on 12/08/2012 and she had a Port-A-Cath placed at that time as well. She should have no evidence of metastatic disease elsewhere distally.  #2  patient has completed 4 cycles of FEC that was given dose dense. She reports see this from 01/10/2013 through March 2014.  #3 she will begin Taxol carboplatinum adjuvantly on a weekly basis starting on 03/17/2013 for a total of 12 weeks.  #4 patient now with DVTs she is on Coumadin and Lovenox. She is being followed by the Coumadin clinic.  PLAN:  #1 patient will return on 03/17/2013 for week 1 of Taxol and carboplatinum.  #2 she is recommended to start taking Coumadin and Lovenox together. Apparently she did not pick up her Lovenox prescription yesterday as she was recommended.  All questions were  answered. The patient knows to call the clinic with any problems, questions or concerns. We can certainly see the patient much sooner if necessary.  I spent 25 minutes counseling the patient face to face. The total time spent in the appointment was 30 minutes.  Drue Second, MD Medical/Oncology [facility] [phone] (beeper) [phone] (Office)

## 2013-03-01 NOTE — Patient Instructions (Addendum)
Continue the lovenox and coumadin. Go to coumadin clinic on 3/21  You will begin Taxol/Carbo on 4/4 weekly for total of 12 weeks

## 2013-03-02 ENCOUNTER — Telehealth: Payer: Self-pay | Admitting: Emergency Medicine

## 2013-03-02 NOTE — Telephone Encounter (Signed)
Patient called with complaints of pain in left lower extremity. Patient states she has been taking tylenol every 4 hours for pain and symptoms persist.  Instructed patient to keep left lower extremity elevated and to rest as much as possible. Per Dr Welton Flakes, instructed patient to take 400mg  Ibuprofen in between Tylenol doses every 6 hours.   Patient verbalized understanding and knows to call office for any other concerns.

## 2013-03-03 ENCOUNTER — Other Ambulatory Visit: Payer: 59 | Admitting: Lab

## 2013-03-03 ENCOUNTER — Ambulatory Visit (HOSPITAL_BASED_OUTPATIENT_CLINIC_OR_DEPARTMENT_OTHER): Payer: 59 | Admitting: Pharmacist

## 2013-03-03 DIAGNOSIS — I82402 Acute embolism and thrombosis of unspecified deep veins of left lower extremity: Secondary | ICD-10-CM

## 2013-03-03 DIAGNOSIS — I82409 Acute embolism and thrombosis of unspecified deep veins of unspecified lower extremity: Secondary | ICD-10-CM

## 2013-03-03 DIAGNOSIS — C50412 Malignant neoplasm of upper-outer quadrant of left female breast: Secondary | ICD-10-CM

## 2013-03-03 LAB — PROTIME-INR
INR: 1.2 — ABNORMAL LOW (ref 2.00–3.50)
Protime: 14.4 Seconds — ABNORMAL HIGH (ref 10.6–13.4)

## 2013-03-03 LAB — POCT INR: INR: 1.2

## 2013-03-03 NOTE — Progress Notes (Signed)
Baseline INR on 03/01/13 = 1 INR today = 1.2 (slightly elevated at baseline). Pt has not started coumadin. She started Lovenox 150mg  daily on 03/01/13. She is currently on a 7-day Keflex rx. Pt has a day or two left of the Keflex rx. A full discussion of the nature of anticoagulants has been carried out. The need for frequent and regular monitoring, precise dosage adjustment and compliance is stressed. Side effects of potential bleeding are discussed.  Drug-Drug interactions and drug-food interactions have been discussed. We reviewed the importance of consistency with Vitamin K intake. Pt handout regarding various foods and vitamin k amounts have been provided to pt. The patient usually "isn't big on greens"; eats green beans, broccoli and collard greens occasionally. I informed her to avoid great swings in general diet.  She drinks alcohol each evening with dinner (a beer or wine). She knows to avoid excessive alcohol consumption. Pt does not use any herbal supplements. Begin coumadin 5mg  daily to start today per MD instructions. Continue Lovenox 150mg  daily. Recheck INR in 5 days on 03/07/13; Lab at 11am and coumadin clinic at 11:15am.

## 2013-03-03 NOTE — Patient Instructions (Signed)
Begin coumadin 5mg  daily today.  Continue Lovenox 150mg  daily. Recheck INR in 5 days on 03/07/13; Lab at 11am and coumadin clinic at 11:15am.

## 2013-03-06 ENCOUNTER — Ambulatory Visit: Payer: 59 | Admitting: Physical Therapy

## 2013-03-07 ENCOUNTER — Other Ambulatory Visit (HOSPITAL_BASED_OUTPATIENT_CLINIC_OR_DEPARTMENT_OTHER): Payer: 59 | Admitting: Lab

## 2013-03-07 ENCOUNTER — Ambulatory Visit (HOSPITAL_BASED_OUTPATIENT_CLINIC_OR_DEPARTMENT_OTHER): Payer: 59 | Admitting: Pharmacist

## 2013-03-07 DIAGNOSIS — I82402 Acute embolism and thrombosis of unspecified deep veins of left lower extremity: Secondary | ICD-10-CM

## 2013-03-07 DIAGNOSIS — I82409 Acute embolism and thrombosis of unspecified deep veins of unspecified lower extremity: Secondary | ICD-10-CM

## 2013-03-07 NOTE — Progress Notes (Signed)
INR = 3.2 Pt on Lovenox since 02/27/13.  Continue until INR at goal of 2-3. Pt has taken 4 doses of Coumadin 5 mg/day.  No missed doses. She completed Keflex on Sunday (3/23).  She states her "place" got better so she stopped taking Keflex.  She wasn't taking them as prescribed but she did take some doses since beginning Coumadin tx. INR rapidly elevated after just 4 days of Coumadin. D/C Lovenox (completed 8 days of tx). Decrease Coumadin to 2.5 mg/day; 5 mg MWF.  It is likely that the Keflex could have contributed to her increase in INR.  However, her INR at baseline was higher than usual. Pt had no questions re: anticoag after her initial visit. Return 1 week. Ebony Hail, Pharm.D., CPP 03/07/2013@11 :32 AM

## 2013-03-08 ENCOUNTER — Ambulatory Visit: Payer: 59 | Admitting: Physical Therapy

## 2013-03-14 ENCOUNTER — Other Ambulatory Visit: Payer: Self-pay | Admitting: Medical Oncology

## 2013-03-14 ENCOUNTER — Ambulatory Visit (HOSPITAL_BASED_OUTPATIENT_CLINIC_OR_DEPARTMENT_OTHER): Payer: 59 | Admitting: Pharmacist

## 2013-03-14 ENCOUNTER — Other Ambulatory Visit (HOSPITAL_BASED_OUTPATIENT_CLINIC_OR_DEPARTMENT_OTHER): Payer: 59 | Admitting: Lab

## 2013-03-14 DIAGNOSIS — I82409 Acute embolism and thrombosis of unspecified deep veins of unspecified lower extremity: Secondary | ICD-10-CM

## 2013-03-14 DIAGNOSIS — I82402 Acute embolism and thrombosis of unspecified deep veins of left lower extremity: Secondary | ICD-10-CM

## 2013-03-14 DIAGNOSIS — C50919 Malignant neoplasm of unspecified site of unspecified female breast: Secondary | ICD-10-CM

## 2013-03-14 LAB — CBC WITH DIFFERENTIAL/PLATELET
Basophils Absolute: 0 10*3/uL (ref 0.0–0.1)
Eosinophils Absolute: 0 10*3/uL (ref 0.0–0.5)
HCT: 28.1 % — ABNORMAL LOW (ref 34.8–46.6)
HGB: 9 g/dL — ABNORMAL LOW (ref 11.6–15.9)
LYMPH%: 9.3 % — ABNORMAL LOW (ref 14.0–49.7)
MCV: 90.4 fL (ref 79.5–101.0)
MONO%: 7.5 % (ref 0.0–14.0)
NEUT#: 15.6 10*3/uL — ABNORMAL HIGH (ref 1.5–6.5)
NEUT%: 82.9 % — ABNORMAL HIGH (ref 38.4–76.8)
Platelets: 461 10*3/uL — ABNORMAL HIGH (ref 145–400)
RDW: 18.9 % — ABNORMAL HIGH (ref 11.2–14.5)

## 2013-03-14 LAB — COMPREHENSIVE METABOLIC PANEL (CC13)
Albumin: 3.2 g/dL — ABNORMAL LOW (ref 3.5–5.0)
Alkaline Phosphatase: 133 U/L (ref 40–150)
Chloride: 103 mEq/L (ref 98–107)
Glucose: 151 mg/dl — ABNORMAL HIGH (ref 70–99)
Potassium: 3.7 mEq/L (ref 3.5–5.1)
Sodium: 141 mEq/L (ref 136–145)
Total Protein: 6.9 g/dL (ref 6.4–8.3)

## 2013-03-14 LAB — PROTIME-INR: Protime: 27.6 Seconds — ABNORMAL HIGH (ref 10.6–13.4)

## 2013-03-14 MED ORDER — LORAZEPAM 0.5 MG PO TABS: 0.5000 mg | ORAL_TABLET | Freq: Four times a day (QID) | ORAL | Status: DC | PRN

## 2013-03-14 NOTE — Progress Notes (Signed)
INR = 2.3 on 2.5 mg/day; 5 mg MWF Pt ambulating normally today.  Her leg is much better.  She is going to work today. Pt c/o skin "boils".  She has noticed them appearing more often after starting chemo.  She is asymptomatic for infection otherwise. INR therapeutic.  Continue same dose. Recheck INR 4/18 w/ tx. Ebony Hail, Pharm.D., CPP 03/14/2013@11 :35 AM

## 2013-03-14 NOTE — Telephone Encounter (Signed)
Patient called requesting for Ativan prescription  to be filled for n/v prior to first chemotherapy tx sched 04/04. Per Augustin Schooling, NP ok to fill prescription of Ativan 0.5 mg, 1 tablet by mouth every 6 hours as needed for nausea and vomiting. Patient informed, no further questions at this time. Patient knows to call office with any questions or concerns.

## 2013-03-17 ENCOUNTER — Encounter: Payer: Self-pay | Admitting: Adult Health

## 2013-03-17 ENCOUNTER — Other Ambulatory Visit: Payer: Self-pay | Admitting: Oncology

## 2013-03-17 ENCOUNTER — Other Ambulatory Visit (HOSPITAL_BASED_OUTPATIENT_CLINIC_OR_DEPARTMENT_OTHER): Payer: 59

## 2013-03-17 ENCOUNTER — Ambulatory Visit (HOSPITAL_BASED_OUTPATIENT_CLINIC_OR_DEPARTMENT_OTHER): Payer: 59 | Admitting: Adult Health

## 2013-03-17 ENCOUNTER — Ambulatory Visit (HOSPITAL_BASED_OUTPATIENT_CLINIC_OR_DEPARTMENT_OTHER): Payer: 59

## 2013-03-17 VITALS — BP 136/77 | HR 95 | Temp 99.0°F | Resp 20 | Ht 64.0 in | Wt 213.8 lb

## 2013-03-17 VITALS — BP 145/94 | HR 90 | Temp 98.7°F | Resp 20

## 2013-03-17 DIAGNOSIS — C50419 Malignant neoplasm of upper-outer quadrant of unspecified female breast: Secondary | ICD-10-CM

## 2013-03-17 DIAGNOSIS — Z5111 Encounter for antineoplastic chemotherapy: Secondary | ICD-10-CM

## 2013-03-17 DIAGNOSIS — C50412 Malignant neoplasm of upper-outer quadrant of left female breast: Secondary | ICD-10-CM

## 2013-03-17 DIAGNOSIS — C50919 Malignant neoplasm of unspecified site of unspecified female breast: Secondary | ICD-10-CM

## 2013-03-17 DIAGNOSIS — Z7901 Long term (current) use of anticoagulants: Secondary | ICD-10-CM

## 2013-03-17 DIAGNOSIS — Z171 Estrogen receptor negative status [ER-]: Secondary | ICD-10-CM

## 2013-03-17 DIAGNOSIS — I82409 Acute embolism and thrombosis of unspecified deep veins of unspecified lower extremity: Secondary | ICD-10-CM

## 2013-03-17 LAB — CBC WITH DIFFERENTIAL/PLATELET
Basophils Absolute: 0.1 10*3/uL (ref 0.0–0.1)
Eosinophils Absolute: 0.1 10*3/uL (ref 0.0–0.5)
HGB: 8.9 g/dL — ABNORMAL LOW (ref 11.6–15.9)
MCV: 93.1 fL (ref 79.5–101.0)
MONO#: 1.4 10*3/uL — ABNORMAL HIGH (ref 0.1–0.9)
NEUT#: 8.4 10*3/uL — ABNORMAL HIGH (ref 1.5–6.5)
RBC: 3.03 10*6/uL — ABNORMAL LOW (ref 3.70–5.45)
RDW: 20.3 % — ABNORMAL HIGH (ref 11.2–14.5)
WBC: 12 10*3/uL — ABNORMAL HIGH (ref 3.9–10.3)
lymph#: 2.1 10*3/uL (ref 0.9–3.3)
nRBC: 3 % — ABNORMAL HIGH (ref 0–0)

## 2013-03-17 LAB — COMPREHENSIVE METABOLIC PANEL (CC13)
ALT: 22 U/L (ref 0–55)
Albumin: 2.9 g/dL — ABNORMAL LOW (ref 3.5–5.0)
CO2: 27 mEq/L (ref 22–29)
Calcium: 8.8 mg/dL (ref 8.4–10.4)
Chloride: 105 mEq/L (ref 98–107)
Glucose: 140 mg/dl — ABNORMAL HIGH (ref 70–99)
Potassium: 4.1 mEq/L (ref 3.5–5.1)
Sodium: 142 mEq/L (ref 136–145)
Total Bilirubin: 0.2 mg/dL (ref 0.20–1.20)
Total Protein: 6.4 g/dL (ref 6.4–8.3)

## 2013-03-17 MED ORDER — DIPHENHYDRAMINE HCL 50 MG/ML IJ SOLN
50.0000 mg | Freq: Once | INTRAMUSCULAR | Status: AC
  Administered 2013-03-17: 50 mg via INTRAVENOUS

## 2013-03-17 MED ORDER — SODIUM CHLORIDE 0.9 % IV SOLN
300.0000 mg | Freq: Once | INTRAVENOUS | Status: AC
  Administered 2013-03-17: 300 mg via INTRAVENOUS
  Filled 2013-03-17: qty 30

## 2013-03-17 MED ORDER — SODIUM CHLORIDE 0.9 % IJ SOLN
10.0000 mL | INTRAMUSCULAR | Status: DC | PRN
  Administered 2013-03-17: 10 mL
  Filled 2013-03-17: qty 10

## 2013-03-17 MED ORDER — SODIUM CHLORIDE 0.9 % IV SOLN
80.0000 mg/m2 | Freq: Once | INTRAVENOUS | Status: AC
  Administered 2013-03-17: 168 mg via INTRAVENOUS
  Filled 2013-03-17: qty 28

## 2013-03-17 MED ORDER — DEXAMETHASONE SODIUM PHOSPHATE 4 MG/ML IJ SOLN
20.0000 mg | Freq: Once | INTRAMUSCULAR | Status: AC
  Administered 2013-03-17: 20 mg via INTRAVENOUS

## 2013-03-17 MED ORDER — SODIUM CHLORIDE 0.9 % IV SOLN
Freq: Once | INTRAVENOUS | Status: AC
  Administered 2013-03-17: 10:00:00 via INTRAVENOUS

## 2013-03-17 MED ORDER — ONDANSETRON 16 MG/50ML IVPB (CHCC)
16.0000 mg | Freq: Once | INTRAVENOUS | Status: AC
  Administered 2013-03-17: 16 mg via INTRAVENOUS

## 2013-03-17 MED ORDER — HEPARIN SOD (PORK) LOCK FLUSH 100 UNIT/ML IV SOLN
500.0000 [IU] | Freq: Once | INTRAVENOUS | Status: AC | PRN
  Administered 2013-03-17: 500 [IU]
  Filled 2013-03-17: qty 5

## 2013-03-17 MED ORDER — FAMOTIDINE IN NACL 20-0.9 MG/50ML-% IV SOLN
20.0000 mg | Freq: Once | INTRAVENOUS | Status: AC
  Administered 2013-03-17: 20 mg via INTRAVENOUS

## 2013-03-17 NOTE — Telephone Encounter (Signed)
Per staff phone call and POF I have schedueld appts.  JMW  

## 2013-03-17 NOTE — Patient Instructions (Addendum)
Carboplatin injection What is this medicine? CARBOPLATIN (KAR boe pla tin) is a chemotherapy drug. It targets fast dividing cells, like cancer cells, and causes these cells to die. This medicine is used to treat ovarian cancer and many other cancers. This medicine may be used for other purposes; ask your health care provider or pharmacist if you have questions. What should I tell my health care provider before I take this medicine? They need to know if you have any of these conditions: -blood disorders -hearing problems -kidney disease -recent or ongoing radiation therapy -an unusual or allergic reaction to carboplatin, cisplatin, other chemotherapy, other medicines, foods, dyes, or preservatives -pregnant or trying to get pregnant -breast-feeding How should I use this medicine? This drug is usually given as an infusion into a vein. It is administered in a hospital or clinic by a specially trained health care professional. Talk to your pediatrician regarding the use of this medicine in children. Special care may be needed. Overdosage: If you think you have taken too much of this medicine contact a poison control center or emergency room at once. NOTE: This medicine is only for you. Do not share this medicine with others. What if I miss a dose? It is important not to miss a dose. Call your doctor or health care professional if you are unable to keep an appointment. What may interact with this medicine? -medicines for seizures -medicines to increase blood counts like filgrastim, pegfilgrastim, sargramostim -some antibiotics like amikacin, gentamicin, neomycin, streptomycin, tobramycin -vaccines Talk to your doctor or health care professional before taking any of these medicines: -acetaminophen -aspirin -ibuprofen -ketoprofen -naproxen This list may not describe all possible interactions. Give your health care provider a list of all the medicines, herbs, non-prescription drugs, or dietary  supplements you use. Also tell them if you smoke, drink alcohol, or use illegal drugs. Some items may interact with your medicine. What should I watch for while using this medicine? Your condition will be monitored carefully while you are receiving this medicine. You will need important blood work done while you are taking this medicine. This drug may make you feel generally unwell. This is not uncommon, as chemotherapy can affect healthy cells as well as cancer cells. Report any side effects. Continue your course of treatment even though you feel ill unless your doctor tells you to stop. In some cases, you may be given additional medicines to help with side effects. Follow all directions for their use. Call your doctor or health care professional for advice if you get a fever, chills or sore throat, or other symptoms of a cold or flu. Do not treat yourself. This drug decreases your body's ability to fight infections. Try to avoid being around people who are sick. This medicine may increase your risk to bruise or bleed. Call your doctor or health care professional if you notice any unusual bleeding. Be careful brushing and flossing your teeth or using a toothpick because you may get an infection or bleed more easily. If you have any dental work done, tell your dentist you are receiving this medicine. Avoid taking products that contain aspirin, acetaminophen, ibuprofen, naproxen, or ketoprofen unless instructed by your doctor. These medicines may hide a fever. Do not become pregnant while taking this medicine. Women should inform their doctor if they wish to become pregnant or think they might be pregnant. There is a potential for serious side effects to an unborn child. Talk to your health care professional or pharmacist for more information.   Do not breast-feed an infant while taking this medicine. What side effects may I notice from receiving this medicine? Side effects that you should report to your  doctor or health care professional as soon as possible: -allergic reactions like skin rash, itching or hives, swelling of the face, lips, or tongue -signs of infection - fever or chills, cough, sore throat, pain or difficulty passing urine -signs of decreased platelets or bleeding - bruising, pinpoint red spots on the skin, black, tarry stools, nosebleeds -signs of decreased red blood cells - unusually weak or tired, fainting spells, lightheadedness -breathing problems -changes in hearing -changes in vision -chest pain -high blood pressure -low blood counts - This drug may decrease the number of white blood cells, red blood cells and platelets. You may be at increased risk for infections and bleeding. -nausea and vomiting -pain, swelling, redness or irritation at the injection site -pain, tingling, numbness in the hands or feet -problems with balance, talking, walking -trouble passing urine or change in the amount of urine Side effects that usually do not require medical attention (report to your doctor or health care professional if they continue or are bothersome): -hair loss -loss of appetite -metallic taste in the mouth or changes in taste This list may not describe all possible side effects. Call your doctor for medical advice about side effects. You may report side effects to FDA at 1-800-FDA-1088. Where should I keep my medicine? This drug is given in a hospital or clinic and will not be stored at home. NOTE: This sheet is a summary. It may not cover all possible information. If you have questions about this medicine, talk to your doctor, pharmacist, or health care provider.  2012, [employer]. (03/06/2008 2:38:05 PM)Paclitaxel injection What is this medicine? PACLITAXEL (PAK li TAX el) is a chemotherapy drug. It targets fast dividing cells, like cancer cells, and causes these cells to die. This medicine is used to treat ovarian cancer, breast cancer, and other  cancers. This medicine may be used for other purposes; ask your health care provider or pharmacist if you have questions. What should I tell my health care provider before I take this medicine? They need to know if you have any of these conditions: -blood disorders -irregular heartbeat -infection (especially a virus infection such as chickenpox, cold sores, or herpes) -liver disease -previous or ongoing radiation therapy -an unusual or allergic reaction to paclitaxel, alcohol, polyoxyethylated castor oil, other chemotherapy agents, other medicines, foods, dyes, or preservatives -pregnant or trying to get pregnant -breast-feeding How should I use this medicine? This drug is given as an infusion into a vein. It is administered in a hospital or clinic by a specially trained health care professional. Talk to your pediatrician regarding the use of this medicine in children. Special care may be needed. Overdosage: If you think you have taken too much of this medicine contact a poison control center or emergency room at once. NOTE: This medicine is only for you. Do not share this medicine with others. What if I miss a dose? It is important not to miss your dose. Call your doctor or health care professional if you are unable to keep an appointment. What may interact with this medicine? Do not take this medicine with any of the following medications: -disulfiram -metronidazole This medicine may also interact with the following medications: -cyclosporine -dexamethasone -diazepam -ketoconazole -medicines to increase blood counts like filgrastim, pegfilgrastim, sargramostim -other chemotherapy drugs like cisplatin, doxorubicin, epirubicin, etoposide, teniposide, vincristine -quinidine -testosterone -vaccines -  verapamil Talk to your doctor or health care professional before taking any of these medicines: -acetaminophen -aspirin -ibuprofen -ketoprofen -naproxen This list may not describe  all possible interactions. Give your health care provider a list of all the medicines, herbs, non-prescription drugs, or dietary supplements you use. Also tell them if you smoke, drink alcohol, or use illegal drugs. Some items may interact with your medicine. What should I watch for while using this medicine? Your condition will be monitored carefully while you are receiving this medicine. You will need important blood work done while you are taking this medicine. This drug may make you feel generally unwell. This is not uncommon, as chemotherapy can affect healthy cells as well as cancer cells. Report any side effects. Continue your course of treatment even though you feel ill unless your doctor tells you to stop. In some cases, you may be given additional medicines to help with side effects. Follow all directions for their use. Call your doctor or health care professional for advice if you get a fever, chills or sore throat, or other symptoms of a cold or flu. Do not treat yourself. This drug decreases your body's ability to fight infections. Try to avoid being around people who are sick. This medicine may increase your risk to bruise or bleed. Call your doctor or health care professional if you notice any unusual bleeding. Be careful brushing and flossing your teeth or using a toothpick because you may get an infection or bleed more easily. If you have any dental work done, tell your dentist you are receiving this medicine. Avoid taking products that contain aspirin, acetaminophen, ibuprofen, naproxen, or ketoprofen unless instructed by your doctor. These medicines may hide a fever. Do not become pregnant while taking this medicine. Women should inform their doctor if they wish to become pregnant or think they might be pregnant. There is a potential for serious side effects to an unborn child. Talk to your health care professional or pharmacist for more information. Do not breast-feed an infant while  taking this medicine. Men are advised not to father a child while receiving this medicine. What side effects may I notice from receiving this medicine? Side effects that you should report to your doctor or health care professional as soon as possible: -allergic reactions like skin rash, itching or hives, swelling of the face, lips, or tongue -low blood counts - This drug may decrease the number of white blood cells, red blood cells and platelets. You may be at increased risk for infections and bleeding. -signs of infection - fever or chills, cough, sore throat, pain or difficulty passing urine -signs of decreased platelets or bleeding - bruising, pinpoint red spots on the skin, black, tarry stools, nosebleeds -signs of decreased red blood cells - unusually weak or tired, fainting spells, lightheadedness -breathing problems -chest pain -high or low blood pressure -mouth sores -nausea and vomiting -pain, swelling, redness or irritation at the injection site -pain, tingling, numbness in the hands or feet -slow or irregular heartbeat -swelling of the ankle, feet, hands Side effects that usually do not require medical attention (report to your doctor or health care professional if they continue or are bothersome): -bone pain -complete hair loss including hair on your head, underarms, pubic hair, eyebrows, and eyelashes -changes in the color of fingernails -diarrhea -loosening of the fingernails -loss of appetite -muscle or joint pain -red flush to skin -sweating This list may not describe all possible side effects. Call your doctor for medical advice   about side effects. You may report side effects to FDA at 1-800-FDA-1088. Where should I keep my medicine? This drug is given in a hospital or clinic and will not be stored at home. NOTE: This sheet is a summary. It may not cover all possible information. If you have questions about this medicine, talk to your doctor, pharmacist, or health care  provider.  2013, [employer]. (11/12/2008 11:54:26 AM)  

## 2013-03-17 NOTE — Progress Notes (Signed)
OFFICE PROGRESS NOTE  CC Dr. Ovidio Kin Dr. 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 Pierce Crane Dr. Ovidio Kin and Dr. Lurline Hare.Patient originally felt lumpiness in her left breast. This prompted a mammogram.Her mammogram was performed on 08/25/2012 showed pleomorphic calcifications of the entire upper outer left breast measuring 11 x 12 x 15 cm. On exam she had a for a multinodular mass like area over the left upper quadrant. Ultrasound confirmed multiple ill-defined hypoechoic areas. Ultrasound of the left axilla showed a few abnormal appearing lymph nodes. Biopsy of the lymph nodes and the breast was performed on 09/01/2012. 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 The ER was 2% PR 0%.   #2Patient was recommended A mastectomy. 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 Lymphovascular invasion was identified all resection margins were negative the 2 sentinel nodes were removed one was positive for metastatic disease. Patient then went on to have a full axillary lymph node dissection performed the pathology showed 10 lymph nodes negative for metastatic disease therefore 1 of 12 lymph nodes were positive. Final pathologic staging was T T3 N1. Tumor was ER negative PR negative HER-2/neu negative.  #3 patient did have genetic counseling performed but she declined genetic testing at that time. We discussed genetic counseling and testing again in patient is now agreeable and we will get this taken care of.  #4 patient was seen by  Dr. Pierce Crane on 11/17/2012 he recommended PET scan which was negative for metastatic disease. He also recommended adjuvant chemotherapy consisting of FEC given every 2 weeks for a total of 4 cycles. She has now completed the [facility] as of end of March 2014.  #5 on 03/17/2013 patient will begin adjuvant Taxol carboplatinum weekly for a total of 12 weeks.  #5 patient with left lower extremity DVT she is on Lovenox and Coumadin  CURRENT THERAPY: Taxol carboplatinum to begin on 03/17/2013  INTERVAL HISTORY: Hannah Hale 47 y.o. female returns for followup visit today.  She it taking Coumadin only and is followed by the coumadin clinic.  She takes 5mg  and 2.5 mg alternating days.  She has tried to go back to work and had a difficult time due to her blood clot and pain in her left leg. She is intermittently nauseated.  She denies fevers, chills, vomiting, headaches, shortness of breath, or any other concerns.       MEDICAL HISTORY: Past Medical History  Diagnosis Date  . Coughing     cold recent   . Breast cancer     left, Sept 20 2013    ALLERGIES:  has No Known Allergies.  MEDICATIONS:  Current Outpatient Prescriptions  Medication Sig Dispense Refill  . dexamethasone (DECADRON) 4 MG tablet       . diphenhydrAMINE (SOMINEX) 25 MG tablet Take 50 mg by mouth 2 (two) times daily as needed. For itching      . lidocaine-prilocaine (EMLA) cream Apply topically as needed.  30 g  6  . LORazepam (ATIVAN) 0.5 MG tablet Take 1 tablet (0.5 mg total) by mouth  every 6 (six) hours as needed (for nausea or vomitting).  30 tablet  0  . ondansetron (ZOFRAN) 8 MG tablet       . prochlorperazine (COMPAZINE) 10 MG tablet       . prochlorperazine (COMPAZINE) 25 MG suppository       . warfarin (COUMADIN) 5 MG tablet Take 1-2 tablets by mouth daily as directed.  60 tablet  1   No current facility-administered medications for this visit.    SURGICAL HISTORY:  Past Surgical History  Procedure Laterality  Date  . Simple mastectomy w/ sentinel node biopsy  11/03/2012  . Simple mastectomy with axillary sentinel node biopsy  11/03/2012    Procedure: SIMPLE MASTECTOMY WITH AXILLARY SENTINEL NODE BIOPSY;  Surgeon: Kandis Cocking, MD;  Location: [facility] OR;  Service: General;  Laterality: Left;  . Portacath placement  12/08/2012    Procedure: INSERTION PORT-A-CATH;  Surgeon: Kandis Cocking, MD;  Location: [facility] ORS;  Service: General;  Laterality: N/A;  Power Port Placment and Left Axillary Node Dissection  . Axillary lymph node dissection  12/08/2012    Procedure: AXILLARY LYMPH NODE DISSECTION;  Surgeon: Kandis Cocking, MD;  Location: [facility] ORS;  Service: General;  Laterality: Left;    REVIEW OF SYSTEMS:  General: fatigue (-), night sweats (-), fever (-), pain (-) Lymph: palpable nodes (-) HEENT: vision changes (-), mucositis (-), gum bleeding (-), epistaxis (-) Cardiovascular: chest pain (-), palpitations (-) Pulmonary: shortness of breath (-), dyspnea on exertion (-), cough (-), hemoptysis (-) GI:  Early satiety (-), melena (-), dysphagia (-), nausea/vomiting (+), diarrhea (-) GU: dysuria (-), hematuria (-), incontinence (-) Musculoskeletal: joint swelling (-), joint pain (-), back pain (-) Neuro: weakness (-), numbness (-), headache (-), confusion (-) Skin: Rash (-), lesions (-), dryness (-) Psych: depression (-), suicidal/homicidal ideation (-), feeling of hopelessness (-)  PHYSICAL EXAMINATION: Blood pressure 136/77, pulse 95, temperature 99 F (37.2 C), temperature source Oral, resp. rate 20, height 5\' 4"  (1.626 m), weight 213 lb 12.8 oz (96.979 kg). Body mass index is 36.68 kg/(m^2). General: Patient is a well appearing female in no acute distress HEENT: PERRLA, sclerae anicteric no conjunctival pallor, MMM Neck: supple, no palpable adenopathy Lungs: clear to auscultation bilaterally, no wheezes, rhonchi, or rales Cardiovascular: regular rate rhythm, S1, S2, no murmurs, rubs or gallops Abdomen:  Soft, non-tender, non-distended, normoactive bowel sounds, no HSM Extremities: warm and well perfused, no clubbing, cyanosis, or edema Skin: No rashes or lesions Neuro: Non-focal Breasts: small approximately 1cm max in diameter lesion of open skin approximately at 6 o'clock under nipple.  Tissue is without tracking, erythema, red granulation and healing tissue is present.   ECOG PERFORMANCE STATUS: 0 - Asymptomatic   LABORATORY DATA: Lab Results  Component Value Date   WBC 12.0* 03/17/2013   HGB 8.9* 03/17/2013   HCT 28.2* 03/17/2013   MCV 93.1 03/17/2013   PLT 347 03/17/2013      Chemistry      Component Value Date/Time   NA 141 03/14/2013 1052   NA 136 11/04/2012 0637   K 3.7 03/14/2013 1052   K 3.5 11/04/2012 0637   CL 103 03/14/2013 1052   CL 103 11/04/2012 0637   CO2 25 03/14/2013 1052   CO2 22 11/04/2012 0637   BUN 8.6 03/14/2013 1052   BUN 6 11/04/2012 0637   CREATININE 0.7 03/14/2013 1052   CREATININE 0.59 11/04/2012 0637      Component Value Date/Time   CALCIUM 9.4 03/14/2013 1052  CALCIUM 8.4 11/04/2012 0637   ALKPHOS 133 03/14/2013 1052   AST 19 03/14/2013 1052   ALT 18 03/14/2013 1052   BILITOT <0.20 Repeated and Verified 03/14/2013 1052       RADIOGRAPHIC STUDIES:  Dg Chest Port 1 View  12/08/2012  *RADIOLOGY REPORT*  Clinical Data: Post line placement.  Rule out pneumothorax.  The  PORTABLE CHEST - 1 VIEW  Comparison: 11/25/2012 PET CT.  10/26/2012 chest x-ray.  Findings: Curvilinear appearance right apex along the undersurface of the posterior aspect of the right second rib probably related to rib rather than pneumothorax.   This can be assessed on follow-up examination if there are any progressive symptoms to suggest pneumothorax.  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  Cardiomegaly.  Mild central pulmonary vascular prominence.  Prior left breast surgery and left axillary lymph node dissection.  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  No definitive  pneumothorax.  Curvilinear structure right lung apex probably associated with the undersurface of rib as noted above.  This is a call report.   Original Report Authenticated By: Lacy Duverney, M.D.    Dg C-arm 1-60 Min-no Report  12/08/2012  CLINICAL DATA: port-a-cath insertion   C-ARM 1-60 MINUTES  Fluoroscopy was utilized by the requesting physician.  No radiographic  interpretation.      ASSESSMENT: 47 year old female with   #1stage III a( T3 N1) invasive ductal carcinoma she is status post mastectomy with axillary lymph node dissection with one of 12 lymph nodes positive for metastatic disease. Tumor was ER negative PR negative HER-2/neu negative. She had her mastectomy on 11/03/2012 with axillary lymph node dissection performed on 12/08/2012 and she had a Port-A-Cath placed at that time as well. She should have no evidence of metastatic disease elsewhere distally.  #2 patient has completed 4 cycles of FEC that was given dose dense. She reports see this from 01/10/2013 through March 2014.  #3 she will begin Taxol carboplatinum adjuvantly on a weekly basis starting on 03/17/2013 for a total of 12 weeks.  #4 patient now with DVTs she is on Coumadin and Lovenox. She is being followed by the Coumadin clinic.  PLAN:  #1 Doing well.  Proceed with chemotherapy.  I refilled her Ativan.   #2 She will continue to take her coumadin under the care of our Coumadin clinic.    #3 We will see Ms. Cloyd back for chemotherapy next week.   All questions were answered. The patient knows to call the clinic with any problems, questions or concerns. We can certainly see the patient much sooner if necessary.  I spent 25 minutes counseling the patient face to face. The total time spent in the appointment was 30 minutes.  Cherie Ouch Lyn Hollingshead, NP Medical Oncology [facility] Phone: [phone]

## 2013-03-17 NOTE — Patient Instructions (Addendum)
Doing well.  Proceed with chemotherapy.  Please call us if you have any questions or concerns.    

## 2013-03-17 NOTE — Telephone Encounter (Signed)
appts made and printed 

## 2013-03-24 ENCOUNTER — Encounter: Payer: Self-pay | Admitting: Adult Health

## 2013-03-24 ENCOUNTER — Ambulatory Visit (HOSPITAL_BASED_OUTPATIENT_CLINIC_OR_DEPARTMENT_OTHER): Payer: 59

## 2013-03-24 ENCOUNTER — Other Ambulatory Visit (HOSPITAL_BASED_OUTPATIENT_CLINIC_OR_DEPARTMENT_OTHER): Payer: 59 | Admitting: Lab

## 2013-03-24 ENCOUNTER — Ambulatory Visit (HOSPITAL_BASED_OUTPATIENT_CLINIC_OR_DEPARTMENT_OTHER): Payer: 59 | Admitting: Adult Health

## 2013-03-24 VITALS — BP 156/84 | HR 102 | Temp 98.4°F | Resp 20 | Ht 64.0 in | Wt 211.6 lb

## 2013-03-24 DIAGNOSIS — C50419 Malignant neoplasm of upper-outer quadrant of unspecified female breast: Secondary | ICD-10-CM

## 2013-03-24 DIAGNOSIS — L738 Other specified follicular disorders: Secondary | ICD-10-CM

## 2013-03-24 DIAGNOSIS — C50919 Malignant neoplasm of unspecified site of unspecified female breast: Secondary | ICD-10-CM

## 2013-03-24 DIAGNOSIS — C773 Secondary and unspecified malignant neoplasm of axilla and upper limb lymph nodes: Secondary | ICD-10-CM

## 2013-03-24 DIAGNOSIS — L678 Other hair color and hair shaft abnormalities: Secondary | ICD-10-CM

## 2013-03-24 DIAGNOSIS — L739 Follicular disorder, unspecified: Secondary | ICD-10-CM

## 2013-03-24 DIAGNOSIS — C50412 Malignant neoplasm of upper-outer quadrant of left female breast: Secondary | ICD-10-CM

## 2013-03-24 DIAGNOSIS — I82409 Acute embolism and thrombosis of unspecified deep veins of unspecified lower extremity: Secondary | ICD-10-CM

## 2013-03-24 DIAGNOSIS — Z5111 Encounter for antineoplastic chemotherapy: Secondary | ICD-10-CM

## 2013-03-24 LAB — COMPREHENSIVE METABOLIC PANEL (CC13)
AST: 18 U/L (ref 5–34)
Alkaline Phosphatase: 110 U/L (ref 40–150)
BUN: 8.8 mg/dL (ref 7.0–26.0)
Calcium: 8.1 mg/dL — ABNORMAL LOW (ref 8.4–10.4)
Creatinine: 0.6 mg/dL (ref 0.6–1.1)

## 2013-03-24 LAB — CBC WITH DIFFERENTIAL/PLATELET
BASO%: 0.6 % (ref 0.0–2.0)
Basophils Absolute: 0.1 10*3/uL (ref 0.0–0.1)
EOS%: 2.4 % (ref 0.0–7.0)
Eosinophils Absolute: 0.2 10*3/uL (ref 0.0–0.5)
HGB: 9.4 g/dL — ABNORMAL LOW (ref 11.6–15.9)
MCV: 93.7 fL (ref 79.5–101.0)
MONO#: 0.6 10*3/uL (ref 0.1–0.9)
NEUT#: 6.1 10*3/uL (ref 1.5–6.5)
RBC: 3.16 10*6/uL — ABNORMAL LOW (ref 3.70–5.45)
RDW: 20 % — ABNORMAL HIGH (ref 11.2–14.5)
WBC: 8.5 10*3/uL (ref 3.9–10.3)
lymph#: 1.6 10*3/uL (ref 0.9–3.3)

## 2013-03-24 MED ORDER — SODIUM CHLORIDE 0.9 % IV SOLN
300.0000 mg | Freq: Once | INTRAVENOUS | Status: AC
  Administered 2013-03-24: 300 mg via INTRAVENOUS
  Filled 2013-03-24: qty 30

## 2013-03-24 MED ORDER — ACETAMINOPHEN 325 MG PO TABS
650.0000 mg | ORAL_TABLET | Freq: Once | ORAL | Status: AC
  Administered 2013-03-24: 650 mg via ORAL

## 2013-03-24 MED ORDER — HEPARIN SOD (PORK) LOCK FLUSH 100 UNIT/ML IV SOLN
500.0000 [IU] | Freq: Once | INTRAVENOUS | Status: AC | PRN
  Administered 2013-03-24: 500 [IU]
  Filled 2013-03-24: qty 5

## 2013-03-24 MED ORDER — SODIUM CHLORIDE 0.9 % IJ SOLN
10.0000 mL | INTRAMUSCULAR | Status: DC | PRN
  Administered 2013-03-24: 10 mL
  Filled 2013-03-24: qty 10

## 2013-03-24 MED ORDER — CLINDAMYCIN PHOSPHATE 1 % EX LOTN: TOPICAL_LOTION | Freq: Two times a day (BID) | CUTANEOUS | Status: DC

## 2013-03-24 MED ORDER — DIPHENHYDRAMINE HCL 50 MG/ML IJ SOLN
50.0000 mg | Freq: Once | INTRAMUSCULAR | Status: AC
  Administered 2013-03-24: 50 mg via INTRAVENOUS

## 2013-03-24 MED ORDER — DEXAMETHASONE SODIUM PHOSPHATE 4 MG/ML IJ SOLN
20.0000 mg | Freq: Once | INTRAMUSCULAR | Status: AC
  Administered 2013-03-24: 20 mg via INTRAVENOUS

## 2013-03-24 MED ORDER — SODIUM CHLORIDE 0.9 % IV SOLN
Freq: Once | INTRAVENOUS | Status: AC
  Administered 2013-03-24: 12:00:00 via INTRAVENOUS

## 2013-03-24 MED ORDER — PACLITAXEL CHEMO INJECTION 300 MG/50ML
80.0000 mg/m2 | Freq: Once | INTRAVENOUS | Status: AC
  Administered 2013-03-24: 168 mg via INTRAVENOUS
  Filled 2013-03-24: qty 28

## 2013-03-24 MED ORDER — FAMOTIDINE IN NACL 20-0.9 MG/50ML-% IV SOLN
20.0000 mg | Freq: Once | INTRAVENOUS | Status: AC
  Administered 2013-03-24: 20 mg via INTRAVENOUS

## 2013-03-24 MED ORDER — ONDANSETRON 16 MG/50ML IVPB (CHCC)
16.0000 mg | Freq: Once | INTRAVENOUS | Status: AC
  Administered 2013-03-24: 16 mg via INTRAVENOUS

## 2013-03-24 NOTE — Progress Notes (Signed)
OFFICE PROGRESS NOTE  CC Dr. Ovidio Kin Dr. 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 Pierce Crane Dr. Ovidio Kin and Dr. Lurline Hare.Patient originally felt lumpiness in her left breast. This prompted a mammogram.Her mammogram was performed on 08/25/2012 showed pleomorphic calcifications of the entire upper outer left breast measuring 11 x 12 x 15 cm. On exam she had a for a multinodular mass like area over the left upper quadrant. Ultrasound confirmed multiple ill-defined hypoechoic areas. Ultrasound of the left axilla showed a few abnormal appearing lymph nodes. Biopsy of the lymph nodes and the breast was performed on 09/01/2012. 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 The ER was 2% PR 0%.   #2Patient was recommended A mastectomy. 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 Lymphovascular invasion was identified all resection margins were negative the 2 sentinel nodes were removed one was positive for metastatic disease. Patient then went on to have a full axillary lymph node dissection performed the pathology showed 10 lymph nodes negative for metastatic disease therefore 1 of 12 lymph nodes were positive. Final pathologic staging was T T3 N1. Tumor was ER negative PR negative HER-2/neu negative.  #3 patient did have genetic counseling performed but she declined genetic testing at that time. We discussed genetic counseling and testing again in patient is now agreeable and we will get this taken care of.  #4 patient was seen by  Dr. Pierce Crane on 11/17/2012 he recommended PET scan which was negative for metastatic disease. He also recommended adjuvant chemotherapy consisting of FEC given every 2 weeks for a total of 4 cycles. She has now completed the [facility] as of end of March 2014.  #5 on 03/17/2013 patient will begin adjuvant Taxol carboplatinum weekly for a total of 12 weeks.  #5 patient with left lower extremity DVT she is on Lovenox and Coumadin  CURRENT THERAPY:  Taxol carbo week 2  INTERVAL HISTORY: Hannah Hale 47 y.o. female returns for followup visit today.  She it taking Coumadin only and is followed by the coumadin clinic.  She takes 5mg  and 2.5 mg alternating days. She has developed 4 more "boils" on her body.  One on her left inner thigh, two on her right inner thigh, and one on her labia.  They do not have any discharge.  She denies fevers, chills, nausea, vomiting, numbness, or further concerns.    MEDICAL HISTORY: Past Medical History  Diagnosis Date  . Coughing     cold recent   . Breast cancer     left, Sept 20 2013    ALLERGIES:  has No Known Allergies.  MEDICATIONS:  Current Outpatient Prescriptions  Medication Sig Dispense Refill  . dexamethasone (DECADRON) 4 MG tablet       . diphenhydrAMINE (SOMINEX) 25 MG tablet Take 50 mg by mouth 2 (two) times daily as needed. For itching      . lidocaine-prilocaine (EMLA) cream Apply topically as needed.  30 g  6  . LORazepam (ATIVAN) 0.5 MG tablet Take 1 tablet (0.5 mg total) by mouth every 6 (  six) hours as needed (for nausea or vomitting).  30 tablet  0  . ondansetron (ZOFRAN) 8 MG tablet       . prochlorperazine (COMPAZINE) 10 MG tablet       . prochlorperazine (COMPAZINE) 25 MG suppository       . warfarin (COUMADIN) 5 MG tablet Take 1-2 tablets by mouth daily as directed.  60 tablet  1  . clindamycin (CLEOCIN-T) 1 % lotion Apply topically 2 (two) times daily.  60 mL  0   No current facility-administered medications for this visit.    Facility-Administered Medications Ordered in Other Visits  Medication Dose Route Frequency Provider Last Rate Last Dose  . CARBOplatin (PARAPLATIN) 300 mg in sodium chloride 0.9 % 100 mL chemo infusion  300 mg Intravenous Once Victorino December, MD      . heparin lock flush 100 unit/mL  500 Units Intracatheter Once PRN Victorino December, MD      . PACLitaxel (TAXOL) 168 mg in dextrose 5 % 250 mL chemo infusion (</= 80mg /m2)  80 mg/m2 (Treatment Plan Actual) Intravenous Once Victorino December, MD 278 mL/hr at 03/24/13 1206 168 mg at 03/24/13 1206  . sodium chloride 0.9 % injection 10 mL  10 mL Intracatheter PRN Victorino December, MD        SURGICAL HISTORY:  Past Surgical History  Procedure Laterality Date  . Simple mastectomy w/ sentinel node biopsy  11/03/2012  . Simple mastectomy with axillary sentinel node biopsy  11/03/2012    Procedure: SIMPLE MASTECTOMY WITH AXILLARY SENTINEL NODE BIOPSY;  Surgeon: Kandis Cocking, MD;  Location: [facility] OR;  Service: General;  Laterality: Left;  . Portacath placement  12/08/2012    Procedure: INSERTION PORT-A-CATH;  Surgeon: Kandis Cocking, MD;  Location: [facility] ORS;  Service: General;  Laterality: N/A;  Power Port Placment and Left Axillary Node Dissection  . Axillary lymph node dissection  12/08/2012    Procedure: AXILLARY LYMPH NODE DISSECTION;  Surgeon: Kandis Cocking, MD;  Location: [facility];  Service: General;  Laterality: Left;    REVIEW OF SYSTEMS:  General: fatigue (-), night sweats (-), fever (-), pain (-) Lymph: palpable nodes (-) HEENT: vision changes (-), mucositis (-), gum bleeding (-), epistaxis (-) Cardiovascular: chest pain (-), palpitations (-) Pulmonary: shortness of breath (-), dyspnea on exertion (-), cough (-), hemoptysis (-) GI:  Early satiety (-), melena (-), dysphagia (-), nausea/vomiting (+), diarrhea (-) GU: dysuria (-), hematuria (-), incontinence (-) Musculoskeletal: joint swelling (-), joint pain (-), back pain (-) Neuro: weakness (-),  numbness (-), headache (-), confusion (-) Skin: Rash (-), lesions (-), dryness (-) Psych: depression (-), suicidal/homicidal ideation (-), feeling of hopelessness (-)  PHYSICAL EXAMINATION: Blood pressure 156/84, pulse 102, temperature 98.4 F (36.9 C), temperature source Oral, resp. rate 20, height 5\' 4"  (1.626 m), weight 211 lb 9.6 oz (95.981 kg). Body mass index is 36.3 kg/(m^2). General: Patient is a well appearing female in no acute distress HEENT: PERRLA, sclerae anicteric no conjunctival pallor, MMM Neck: supple, no palpable adenopathy Lungs: clear to auscultation bilaterally, no wheezes, rhonchi, or rales Cardiovascular: regular rate rhythm, S1, S2, no murmurs, rubs or gallops Abdomen: Soft, non-tender, non-distended, normoactive bowel sounds, no HSM Extremities: warm and well perfused, no clubbing, cyanosis, or edema Skin: No rashes or lesions, pustular lesions forming at hair follicle on interior thighs, and labia.   Neuro: Non-focal Breasts: left mastectomy site without nodularity, well healed, right breast no masses/nodularity ECOG PERFORMANCE STATUS: 0 -  Asymptomatic   LABORATORY DATA: Lab Results  Component Value Date   WBC 8.5 03/24/2013   HGB 9.4* 03/24/2013   HCT 29.6* 03/24/2013   MCV 93.7 03/24/2013   PLT 291 03/24/2013      Chemistry      Component Value Date/Time   NA 143 03/24/2013 1017   NA 136 11/04/2012 0637   K 3.4* 03/24/2013 1017   K 3.5 11/04/2012 0637   CL 112* 03/24/2013 1017   CL 103 11/04/2012 0637   CO2 20* 03/24/2013 1017   CO2 22 11/04/2012 0637   BUN 8.8 03/24/2013 1017   BUN 6 11/04/2012 0637   CREATININE 0.6 03/24/2013 1017   CREATININE 0.59 11/04/2012 0637      Component Value Date/Time   CALCIUM 8.1* 03/24/2013 1017   CALCIUM 8.4 11/04/2012 0637   ALKPHOS 110 03/24/2013 1017   AST 18 03/24/2013 1017   ALT 17 03/24/2013 1017   BILITOT <0.20 Repeated and Verified 03/24/2013 1017       RADIOGRAPHIC STUDIES:  Dg Chest Port 1  View  12/15/2012  *RADIOLOGY REPORT*  Clinical Data: Post line placement.  Rule out pneumothorax.  The  PORTABLE CHEST - 1 VIEW  Comparison: 11/25/2012 PET CT.  10/26/2012 chest x-ray.  Findings: Curvilinear appearance right apex along the undersurface of the posterior aspect of the right second rib probably related to rib rather than pneumothorax.   This can be assessed on follow-up examination if there are any progressive symptoms to suggest pneumothorax.  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  Cardiomegaly.  Mild central pulmonary vascular prominence.  Prior left breast surgery and left axillary lymph node dissection.  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  No definitive pneumothorax.  Curvilinear structure right lung apex probably associated with the undersurface of rib as noted above.  This is a call report.   Original Report Authenticated By: Lacy Duverney, M.D.    Dg C-arm 1-60 Min-no Report  15-Dec-2012  CLINICAL DATA: port-a-cath insertion   C-ARM 1-60 MINUTES  Fluoroscopy was utilized by the requesting physician.  No radiographic  interpretation.      ASSESSMENT: 47 year old female with   #1stage III a( T3 N1) invasive ductal carcinoma she is status post mastectomy with axillary lymph node dissection with one of 12 lymph nodes positive for metastatic disease. Tumor was ER negative PR negative HER-2/neu negative. She had her mastectomy on 11/03/2012 with axillary lymph node dissection performed on 12-15-12 and she had a Port-A-Cath placed at that time as well. She should have no evidence of metastatic disease elsewhere distally.  #2 patient has completed 4 cycles of FEC that was given dose dense. She reports see this from 01/10/2013 through March 2014.  #3 she will begin Taxol carboplatinum adjuvantly on a weekly basis starting on 03/17/2013 for a total of 12 weeks.  #4 patient now with DVTs she is on Coumadin and Lovenox. She is being followed by the  Coumadin clinic.  #5 Folliculitis  PLAN:  #1 Doing well.  Proceed with chemotherapy.  #2 She will continue to take her coumadin under the care of our Coumadin clinic.    #3 We will see Hannah Hale back for chemotherapy next week.   #4 Her lesions are likely folliculitis.  I instructed her in warm compresses to the area BID and prescribed topical Clindamycin.  All questions were answered. The patient knows to call the clinic with any problems, questions or concerns. We can certainly see  the patient much sooner if necessary.  I spent 25 minutes counseling the patient face to face. The total time spent in the appointment was 30 minutes.  Cherie Ouch Lyn Hollingshead, NP Medical Oncology [facility] Phone: [phone]

## 2013-03-24 NOTE — Patient Instructions (Signed)
Folliculitis  Folliculitis is redness, soreness, and swelling (inflammation) of the hair follicles. This condition can occur anywhere on the body. People with weakened immune systems, diabetes, or obesity have a greater risk of getting folliculitis. CAUSES  Bacterial infection. This is the most common cause.  Fungal infection.  Viral infection.  Contact with certain chemicals, especially oils and tars. Long-term folliculitis can result from bacteria that live in the nostrils. The bacteria may trigger multiple outbreaks of folliculitis over time. SYMPTOMS Folliculitis most commonly occurs on the scalp, thighs, legs, back, buttocks, and areas where hair is shaved frequently. An early sign of folliculitis is a small, white or yellow, pus-filled, itchy lesion (pustule). These lesions appear on a red, inflamed follicle. They are usually less than 0.2 inches (5 mm) wide. When there is an infection of the follicle that goes deeper, it becomes a boil or furuncle. A group of closely packed boils creates a larger lesion (carbuncle). Carbuncles tend to occur in hairy, sweaty areas of the body. DIAGNOSIS  Your caregiver can usually tell what is wrong by doing a physical exam. A sample may be taken from one of the lesions and tested in a lab. This can help determine what is causing your folliculitis. TREATMENT  Treatment may include:  Applying warm compresses to the affected areas.  Taking antibiotic medicines orally or applying them to the skin.  Draining the lesions if they contain a large amount of pus or fluid.  Laser hair removal for cases of long-lasting folliculitis. This helps to prevent regrowth of the hair. HOME CARE INSTRUCTIONS  Apply warm compresses to the affected areas as directed by your caregiver.  If antibiotics are prescribed, take them as directed. Finish them even if you start to feel better.  You may take over-the-counter medicines to relieve itching.  Do not shave  irritated skin.  Follow up with your caregiver as directed. SEEK IMMEDIATE MEDICAL CARE IF:   You have increasing redness, swelling, or pain in the affected area.  You have a fever. MAKE SURE YOU:  Understand these instructions.  Will watch your condition.  Will get help right away if you are not doing well or get worse. Document Released: 02/08/2002 Document Revised: 05/31/2012 Document Reviewed: 03/01/2012 [facility] Patient Information 2013 [place], [place].  Doing well.  Proceed with chemotherapy.  Please call us if you have any questions or concerns.

## 2013-03-24 NOTE — Patient Instructions (Addendum)
[facility] Discharge Instructions for Patients Receiving Chemotherapy  Today you received the following chemotherapy agents; Taxol and Carboplatin.  To help prevent nausea and vomiting after your treatment, we encourage you to take your nausea medication as directed.   If you develop nausea and vomiting that is not controlled by your nausea medication, call the clinic. If it is after clinic hours your family physician or the after hours number for the clinic or go to the Emergency Department.   BELOW ARE SYMPTOMS THAT SHOULD BE REPORTED IMMEDIATELY:  *FEVER GREATER THAN 100.5 F  *CHILLS WITH OR WITHOUT FEVER  NAUSEA AND VOMITING THAT IS NOT CONTROLLED WITH YOUR NAUSEA MEDICATION  *UNUSUAL SHORTNESS OF BREATH  *UNUSUAL BRUISING OR BLEEDING  TENDERNESS IN MOUTH AND THROAT WITH OR WITHOUT PRESENCE OF ULCERS  *URINARY PROBLEMS  *BOWEL PROBLEMS  UNUSUAL RASH Items with * indicate a potential emergency and should be followed up as soon as possible.   Feel free to call the clinic you have any questions or concerns. The clinic phone number is [phone].   I have been informed and understand all the instructions given to me. I know to contact the clinic, my physician, or go to the Emergency Department if any problems should occur. I do not have any questions at this time, but understand that I may call the clinic during office hours   should I have any questions or need assistance in obtaining follow up care.    __________________________________________  _____________  __________ Signature of Patient or Authorized Representative            Date                   Time    __________________________________________ Nurse's Signature    

## 2013-03-24 NOTE — Progress Notes (Signed)
Pt c/o abd cramping when her taxol was close to completed.  States feels like she is going to start her menstrual cycle soon.  Requested tylenol for the cramps.  Verbal order from Augustin Schooling for tylenol and for pt to call if cramps get worse tomorrow.    Pt denies diarrhea.  She suddenly vomited prior to taking the tylenol.  Paused Taxol for 10 minutes.  Pt states felt much better after vomiting and took her tylenol.  Denied need or want for any further anti emetics and her VSS.   Taxol completed and Carbo given as ordered.  Pt states cramps better and no nausea upon d/c from clinic.

## 2013-03-28 ENCOUNTER — Ambulatory Visit: Payer: 59

## 2013-03-31 ENCOUNTER — Other Ambulatory Visit: Payer: 59 | Admitting: Lab

## 2013-03-31 ENCOUNTER — Other Ambulatory Visit (HOSPITAL_BASED_OUTPATIENT_CLINIC_OR_DEPARTMENT_OTHER): Payer: 59 | Admitting: Lab

## 2013-03-31 ENCOUNTER — Ambulatory Visit (HOSPITAL_BASED_OUTPATIENT_CLINIC_OR_DEPARTMENT_OTHER): Payer: 59 | Admitting: Adult Health

## 2013-03-31 ENCOUNTER — Ambulatory Visit (HOSPITAL_BASED_OUTPATIENT_CLINIC_OR_DEPARTMENT_OTHER): Payer: 59

## 2013-03-31 ENCOUNTER — Encounter: Payer: Self-pay | Admitting: Adult Health

## 2013-03-31 ENCOUNTER — Ambulatory Visit: Payer: 59 | Admitting: Pharmacist

## 2013-03-31 VITALS — BP 153/89 | HR 106 | Temp 98.2°F | Resp 20 | Ht 64.0 in | Wt 211.2 lb

## 2013-03-31 DIAGNOSIS — Z5111 Encounter for antineoplastic chemotherapy: Secondary | ICD-10-CM

## 2013-03-31 DIAGNOSIS — C50419 Malignant neoplasm of upper-outer quadrant of unspecified female breast: Secondary | ICD-10-CM

## 2013-03-31 DIAGNOSIS — C50919 Malignant neoplasm of unspecified site of unspecified female breast: Secondary | ICD-10-CM

## 2013-03-31 DIAGNOSIS — I82409 Acute embolism and thrombosis of unspecified deep veins of unspecified lower extremity: Secondary | ICD-10-CM

## 2013-03-31 DIAGNOSIS — C50412 Malignant neoplasm of upper-outer quadrant of left female breast: Secondary | ICD-10-CM

## 2013-03-31 DIAGNOSIS — I82402 Acute embolism and thrombosis of unspecified deep veins of left lower extremity: Secondary | ICD-10-CM

## 2013-03-31 DIAGNOSIS — L738 Other specified follicular disorders: Secondary | ICD-10-CM

## 2013-03-31 DIAGNOSIS — Z171 Estrogen receptor negative status [ER-]: Secondary | ICD-10-CM

## 2013-03-31 LAB — CBC WITH DIFFERENTIAL/PLATELET
BASO%: 1 % (ref 0.0–2.0)
Eosinophils Absolute: 0.2 10*3/uL (ref 0.0–0.5)
HCT: 28.5 % — ABNORMAL LOW (ref 34.8–46.6)
LYMPH%: 18.7 % (ref 14.0–49.7)
MCHC: 31.9 g/dL (ref 31.5–36.0)
MONO#: 0.6 10*3/uL (ref 0.1–0.9)
NEUT#: 4.9 10*3/uL (ref 1.5–6.5)
NEUT%: 69.4 % (ref 38.4–76.8)
Platelets: 222 10*3/uL (ref 145–400)
RBC: 3.03 10*6/uL — ABNORMAL LOW (ref 3.70–5.45)
WBC: 7.1 10*3/uL (ref 3.9–10.3)
lymph#: 1.3 10*3/uL (ref 0.9–3.3)
nRBC: 0 % (ref 0–0)

## 2013-03-31 LAB — COMPREHENSIVE METABOLIC PANEL (CC13)
Alkaline Phosphatase: 112 U/L (ref 40–150)
CO2: 23 mEq/L (ref 22–29)
Creatinine: 0.6 mg/dL (ref 0.6–1.1)
Glucose: 145 mg/dl — ABNORMAL HIGH (ref 70–99)
Total Bilirubin: 0.22 mg/dL (ref 0.20–1.20)

## 2013-03-31 LAB — PROTHROMBIN TIME: INR: 0.99 (ref <1.50)

## 2013-03-31 LAB — POCT INR: INR: 0.99

## 2013-03-31 MED ORDER — HEPARIN SOD (PORK) LOCK FLUSH 100 UNIT/ML IV SOLN
500.0000 [IU] | Freq: Once | INTRAVENOUS | Status: AC | PRN
Start: 2013-03-31 — End: 2013-03-31
  Administered 2013-03-31: 500 [IU]
  Filled 2013-03-31: qty 5

## 2013-03-31 MED ORDER — SODIUM CHLORIDE 0.9 % IV SOLN
80.0000 mg/m2 | Freq: Once | INTRAVENOUS | Status: AC
  Administered 2013-03-31: 168 mg via INTRAVENOUS
  Filled 2013-03-31: qty 28

## 2013-03-31 MED ORDER — DEXAMETHASONE SODIUM PHOSPHATE 4 MG/ML IJ SOLN
20.0000 mg | Freq: Once | INTRAMUSCULAR | Status: AC
  Administered 2013-03-31: 20 mg via INTRAVENOUS

## 2013-03-31 MED ORDER — DIPHENHYDRAMINE HCL 50 MG/ML IJ SOLN
50.0000 mg | Freq: Once | INTRAMUSCULAR | Status: AC
  Administered 2013-03-31: 50 mg via INTRAVENOUS

## 2013-03-31 MED ORDER — FAMOTIDINE IN NACL 20-0.9 MG/50ML-% IV SOLN
20.0000 mg | Freq: Once | INTRAVENOUS | Status: AC
  Administered 2013-03-31: 20 mg via INTRAVENOUS

## 2013-03-31 MED ORDER — SODIUM CHLORIDE 0.9 % IV SOLN
Freq: Once | INTRAVENOUS | Status: AC
  Administered 2013-03-31: 12:00:00 via INTRAVENOUS

## 2013-03-31 MED ORDER — SODIUM CHLORIDE 0.9 % IJ SOLN
10.0000 mL | INTRAMUSCULAR | Status: DC | PRN
  Administered 2013-03-31: 10 mL
  Filled 2013-03-31: qty 10

## 2013-03-31 MED ORDER — ACETAMINOPHEN 325 MG PO TABS
650.0000 mg | ORAL_TABLET | Freq: Four times a day (QID) | ORAL | Status: AC | PRN
  Administered 2013-03-31: 650 mg via ORAL

## 2013-03-31 MED ORDER — SODIUM CHLORIDE 0.9 % IV SOLN
300.0000 mg | Freq: Once | INTRAVENOUS | Status: AC
  Administered 2013-03-31: 300 mg via INTRAVENOUS
  Filled 2013-03-31: qty 30

## 2013-03-31 MED ORDER — ONDANSETRON 16 MG/50ML IVPB (CHCC)
16.0000 mg | Freq: Once | INTRAVENOUS | Status: AC
  Administered 2013-03-31: 16 mg via INTRAVENOUS

## 2013-03-31 NOTE — Patient Instructions (Addendum)
INR reading very low (as if you are not even taking coumadin) likely error from machine or interaction with chemo and INR machine  No changes  Continue same dose of 2.5 mg daily except for 5 mg on MWF  Return next week for appointments and pharmacist will see you in infusion area

## 2013-03-31 NOTE — Progress Notes (Signed)
OFFICE PROGRESS NOTE  CC Dr. Ovidio Kin Dr. 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 Pierce Crane Dr. Ovidio Kin and Dr. Lurline Hare.Patient originally felt lumpiness in her left breast. This prompted a mammogram.Her mammogram was performed on 08/25/2012 showed pleomorphic calcifications of the entire upper outer left breast measuring 11 x 12 x 15 cm. On exam she had a for a multinodular mass like area over the left upper quadrant. Ultrasound confirmed multiple ill-defined hypoechoic areas. Ultrasound of the left axilla showed a few abnormal appearing lymph nodes. Biopsy of the lymph nodes and the breast was performed on 09/01/2012. 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 The ER was 2% PR 0%.   #2Patient was recommended A mastectomy. 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 Lymphovascular invasion was identified all resection margins were negative the 2 sentinel nodes were removed one was positive for metastatic disease. Patient then went on to have a full axillary lymph node dissection performed the pathology showed 10 lymph nodes negative for metastatic disease therefore 1 of 12 lymph nodes were positive. Final pathologic staging was T T3 N1. Tumor was ER negative PR negative HER-2/neu negative.  #3 patient did have genetic counseling performed but she declined genetic testing at that time. We discussed genetic counseling and testing again in patient is now agreeable and we will get this taken care of.  #4 patient was seen by  Dr. Pierce Crane on 11/17/2012 he recommended PET scan which was negative for metastatic disease. He also recommended adjuvant chemotherapy consisting of FEC given every 2 weeks for a total of 4 cycles. She has now completed the [facility] as of end of March 2014.  #5 on 03/17/2013 patient will begin adjuvant Taxol carboplatinum weekly for a total of 12 weeks.  #5 patient with left lower extremity DVT she is on Lovenox and Coumadin  CURRENT THERAPY:  Taxol carbo week 3  INTERVAL HISTORY: Irving Lubbers 47 y.o. female returns for followup visit today.  She contininues to take Coumadin and is followed by the coumadin clinic.  She takes 5mg  and 2.5 mg alternating days. Her "boils" are improved with the Clindamycin ointment and warm compresses BID.  She denies fevers, chills, nausea, vomiting, constipation, diarrhea, numbness.      MEDICAL HISTORY: Past Medical History  Diagnosis Date  . Coughing     cold recent   . Breast cancer     left, Sept 20 2013    ALLERGIES:  has No Known Allergies.  MEDICATIONS:  Current Outpatient Prescriptions  Medication Sig Dispense Refill  . clindamycin (CLEOCIN-T) 1 % lotion Apply topically 2 (two) times daily.  60 mL  0  . dexamethasone (DECADRON) 4 MG tablet       . diphenhydrAMINE (SOMINEX) 25 MG tablet Take 50 mg by mouth 2 (two) times daily as needed. For itching      . lidocaine-prilocaine (EMLA) cream Apply topically as needed.  30 g  6  . LORazepam (ATIVAN) 0.5 MG tablet Take 1 tablet (0.5 mg total) by mouth every 6 (six) hours as  needed (for nausea or vomitting).  30 tablet  0  . ondansetron (ZOFRAN) 8 MG tablet       . prochlorperazine (COMPAZINE) 10 MG tablet       . prochlorperazine (COMPAZINE) 25 MG suppository       . warfarin (COUMADIN) 5 MG tablet Take 1-2 tablets by mouth daily as directed.  60 tablet  1   No current facility-administered medications for this visit.    SURGICAL HISTORY:  Past Surgical History  Procedure Laterality Date   . Simple mastectomy w/ sentinel node biopsy  11/03/2012  . Simple mastectomy with axillary sentinel node biopsy  11/03/2012    Procedure: SIMPLE MASTECTOMY WITH AXILLARY SENTINEL NODE BIOPSY;  Surgeon: Kandis Cocking, MD;  Location: [facility] OR;  Service: General;  Laterality: Left;  . Portacath placement  12/08/2012    Procedure: INSERTION PORT-A-CATH;  Surgeon: Kandis Cocking, MD;  Location: [facility] ORS;  Service: General;  Laterality: N/A;  Power Port Placment and Left Axillary Node Dissection  . Axillary lymph node dissection  12/08/2012    Procedure: AXILLARY LYMPH NODE DISSECTION;  Surgeon: Kandis Cocking, MD;  Location: [facility] ORS;  Service: General;  Laterality: Left;    REVIEW OF SYSTEMS:  General: fatigue (-), night sweats (-), fever (-), pain (-) Lymph: palpable nodes (-) HEENT: vision changes (-), mucositis (-), gum bleeding (-), epistaxis (-) Cardiovascular: chest pain (-), palpitations (-) Pulmonary: shortness of breath (-), dyspnea on exertion (-), cough (-), hemoptysis (-) GI:  Early satiety (-), melena (-), dysphagia (-), nausea/vomiting (-), diarrhea (-) GU: dysuria (-), hematuria (-), incontinence (-) Musculoskeletal: joint swelling (-), joint pain (-), back pain (-) Neuro: weakness (-), numbness (-), headache (-), confusion (-) Skin: Rash (-), lesions (-), dryness (-) Psych: depression (-), suicidal/homicidal ideation (-), feeling of hopelessness (-)  PHYSICAL EXAMINATION: Blood pressure 153/89, pulse 106, temperature 98.2 F (36.8 C), temperature source Oral, resp. rate 20, height 5\' 4"  (1.626 m), weight 211 lb 3.2 oz (95.8 kg). Body mass index is 36.23 kg/(m^2). General: Patient is a well appearing female in no acute distress HEENT: PERRLA, sclerae anicteric no conjunctival pallor, MMM Neck: supple, no palpable adenopathy Lungs: clear to auscultation bilaterally, no wheezes, rhonchi, or rales Cardiovascular: regular rate rhythm, S1, S2, no murmurs, rubs or gallops, recheck hr  98 Abdomen: Soft, non-tender, non-distended, normoactive bowel sounds, no HSM Extremities: warm and well perfused, no clubbing, cyanosis, or edema Skin: No rashes or lesions, pustular lesions forming at hair follicle on interior thighs, and labia.   Neuro: Non-focal Breasts: left mastectomy site without nodularity, well healed, right breast no masses/nodularity ECOG PERFORMANCE STATUS: 0 - Asymptomatic   LABORATORY DATA: Lab Results  Component Value Date   WBC 7.1 03/31/2013   HGB 9.1* 03/31/2013   HCT 28.5* 03/31/2013   MCV 94.1 03/31/2013   PLT 222 03/31/2013      Chemistry      Component Value Date/Time   NA 143 03/24/2013 1017   NA 136 11/04/2012 0637   K 3.4* 03/24/2013 1017   K 3.5 11/04/2012 0637   CL 112* 03/24/2013 1017   CL 103 11/04/2012 0637   CO2 20* 03/24/2013 1017   CO2 22 11/04/2012 0637   BUN 8.8 03/24/2013 1017   BUN 6 11/04/2012 0637   CREATININE 0.6 03/24/2013 1017   CREATININE 0.59 11/04/2012 0637      Component Value Date/Time   CALCIUM 8.1* 03/24/2013 1017   CALCIUM 8.4 11/04/2012 0637   ALKPHOS  110 03/24/2013 1017   AST 18 03/24/2013 1017   ALT 17 03/24/2013 1017   BILITOT <0.20 Repeated and Verified 03/24/2013 1017       RADIOGRAPHIC STUDIES:  Dg Chest Port 1 View  12/08/2012  *RADIOLOGY REPORT*  Clinical Data: Post line placement.  Rule out pneumothorax.  The  PORTABLE CHEST - 1 VIEW  Comparison: 11/25/2012 PET CT.  10/26/2012 chest x-ray.  Findings: Curvilinear appearance right apex along the undersurface of the posterior aspect of the right second rib probably related to rib rather than pneumothorax.   This can be assessed on follow-up examination if there are any progressive symptoms to suggest pneumothorax.  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  Cardiomegaly.  Mild central pulmonary vascular prominence.  Prior left breast surgery and left axillary lymph node dissection.  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  No  definitive pneumothorax.  Curvilinear structure right lung apex probably associated with the undersurface of rib as noted above.  This is a call report.   Original Report Authenticated By: Lacy Duverney, M.D.    Dg C-arm 1-60 Min-no Report  12/08/2012  CLINICAL DATA: port-a-cath insertion   C-ARM 1-60 MINUTES  Fluoroscopy was utilized by the requesting physician.  No radiographic  interpretation.      ASSESSMENT: 47 year old female with   #1stage III a( T3 N1) invasive ductal carcinoma she is status post mastectomy with axillary lymph node dissection with one of 12 lymph nodes positive for metastatic disease. Tumor was ER negative PR negative HER-2/neu negative. She had her mastectomy on 11/03/2012 with axillary lymph node dissection performed on 12/08/2012 and she had a Port-A-Cath placed at that time as well. She should have no evidence of metastatic disease elsewhere distally.  #2 patient has completed 4 cycles of FEC that was given dose dense. She reports see this from 01/10/2013 through March 2014.  #3 she will begin Taxol carboplatinum adjuvantly on a weekly basis starting on 03/17/2013 for a total of 12 weeks.  #4 patient now with DVTs she is on Coumadin and Lovenox. She is being followed by the Coumadin clinic.  #5 Folliculitis  PLAN:  #1 Doing well.  Proceed with chemotherapy.  #2 She will continue to take her coumadin under the care of our Coumadin clinic.    #3 We will see Ms. Henken back for chemotherapy next week.   #4 Her lesions are likely folliculitis.  I instructed her in warm compresses to the area BID and prescribed topical Clindamycin.  This has improved, she will continue her treatment.    All questions were answered. The patient knows to call the clinic with any problems, questions or concerns. We can certainly see the patient much sooner if necessary.  I spent 25 minutes counseling the patient face to face. The total time spent in the appointment was 30  minutes.  Cherie Ouch Lyn Hollingshead, NP Medical Oncology [facility] Phone: [phone]

## 2013-03-31 NOTE — Progress Notes (Signed)
INR was not drawn with other labs this morning and patient had to have INR drawn after chemo. This could have resulted in a misreading as the INR came back as 0.99. Pt was already gone when reading came back so it could not be repeated.  This is likely an error as patient has been stable for the last month and reports not missing any doses of coumadin or any new medications. No arm pain or leg swelling noted, no bruising or bleeding. Instructed patient that this was likely a machine error and told her over the phone to continue same dose of 2.5 mg daily except for 5 mg on MWF. Return next week with appointments: lab at 10:15am, MD appointment at 10:45am, Infusion at 11:45am and coumadin clinic pharmacist will see patient in infusion area (coumadin clinic at noon)

## 2013-03-31 NOTE — Patient Instructions (Addendum)
Doing well.  Proceed with chemotherapy.  Please call us if you have any questions or concerns.    

## 2013-03-31 NOTE — Patient Instructions (Addendum)
[facility] Discharge Instructions for Patients Receiving Chemotherapy  Today you received the following chemotherapy agents Taxol and Carboplatin.  To help prevent nausea and vomiting after your treatment, we encourage you to take your nausea medication as prescribed. .   If you develop nausea and vomiting that is not controlled by your nausea medication, call the clinic. If it is after clinic hours your family physician or the after hours number for the clinic or go to the Emergency Department.   BELOW ARE SYMPTOMS THAT SHOULD BE REPORTED IMMEDIATELY:  *FEVER GREATER THAN 100.5 F  *CHILLS WITH OR WITHOUT FEVER  NAUSEA AND VOMITING THAT IS NOT CONTROLLED WITH YOUR NAUSEA MEDICATION  *UNUSUAL SHORTNESS OF BREATH  *UNUSUAL BRUISING OR BLEEDING  TENDERNESS IN MOUTH AND THROAT WITH OR WITHOUT PRESENCE OF ULCERS  *URINARY PROBLEMS  *BOWEL PROBLEMS  UNUSUAL RASH Items with * indicate a potential emergency and should be followed up as soon as possible.  Please let the nurse know about any problems that you may have experienced. Feel free to call the clinic you have any questions or concerns. The clinic phone number is [phone].   I have been informed and understand all the instructions given to me. I know to contact the clinic, my physician, or go to the Emergency Department if any problems should occur. I do not have any questions at this time, but understand that I may call the clinic during office hours   should I have any questions or need assistance in obtaining follow up care.    __________________________________________  _____________  __________ Signature of Patient or Authorized Representative            Date                   Time    __________________________________________ Nurse's Signature    

## 2013-04-03 ENCOUNTER — Telehealth: Payer: Self-pay | Admitting: Medical Oncology

## 2013-04-03 NOTE — Telephone Encounter (Signed)
Patient called stating she forgot to take her scheduled coumadin yesterday wanting to know what to do, should she double up today's dose. Per Pharmacist Concha Norway, informed patient not to double up dose, patient to take today's dose as usual. Patient expressed verbal understanding, no further questions at this time.

## 2013-04-07 ENCOUNTER — Ambulatory Visit (HOSPITAL_BASED_OUTPATIENT_CLINIC_OR_DEPARTMENT_OTHER): Payer: 59

## 2013-04-07 ENCOUNTER — Ambulatory Visit: Payer: 59 | Admitting: Pharmacist

## 2013-04-07 ENCOUNTER — Other Ambulatory Visit (HOSPITAL_BASED_OUTPATIENT_CLINIC_OR_DEPARTMENT_OTHER): Payer: 59 | Admitting: Lab

## 2013-04-07 ENCOUNTER — Encounter: Payer: Self-pay | Admitting: Adult Health

## 2013-04-07 ENCOUNTER — Ambulatory Visit (HOSPITAL_BASED_OUTPATIENT_CLINIC_OR_DEPARTMENT_OTHER): Payer: 59 | Admitting: Adult Health

## 2013-04-07 VITALS — BP 142/85 | HR 91 | Temp 98.3°F | Resp 20 | Ht 64.0 in | Wt 209.0 lb

## 2013-04-07 DIAGNOSIS — C50419 Malignant neoplasm of upper-outer quadrant of unspecified female breast: Secondary | ICD-10-CM

## 2013-04-07 DIAGNOSIS — I82409 Acute embolism and thrombosis of unspecified deep veins of unspecified lower extremity: Secondary | ICD-10-CM

## 2013-04-07 DIAGNOSIS — C50919 Malignant neoplasm of unspecified site of unspecified female breast: Secondary | ICD-10-CM

## 2013-04-07 DIAGNOSIS — C773 Secondary and unspecified malignant neoplasm of axilla and upper limb lymph nodes: Secondary | ICD-10-CM

## 2013-04-07 DIAGNOSIS — I82402 Acute embolism and thrombosis of unspecified deep veins of left lower extremity: Secondary | ICD-10-CM

## 2013-04-07 DIAGNOSIS — C50412 Malignant neoplasm of upper-outer quadrant of left female breast: Secondary | ICD-10-CM

## 2013-04-07 DIAGNOSIS — Z5111 Encounter for antineoplastic chemotherapy: Secondary | ICD-10-CM

## 2013-04-07 DIAGNOSIS — Z171 Estrogen receptor negative status [ER-]: Secondary | ICD-10-CM

## 2013-04-07 LAB — CBC WITH DIFFERENTIAL/PLATELET
BASO%: 0.6 % (ref 0.0–2.0)
EOS%: 1.5 % (ref 0.0–7.0)
HCT: 29.8 % — ABNORMAL LOW (ref 34.8–46.6)
MCH: 29.9 pg (ref 25.1–34.0)
MCHC: 31.9 g/dL (ref 31.5–36.0)
MONO#: 0.4 10*3/uL (ref 0.1–0.9)
RDW: 19.8 % — ABNORMAL HIGH (ref 11.2–14.5)
WBC: 7.3 10*3/uL (ref 3.9–10.3)
lymph#: 1.4 10*3/uL (ref 0.9–3.3)
nRBC: 0 % (ref 0–0)

## 2013-04-07 LAB — PROTIME-INR
INR: 1.1 — ABNORMAL LOW (ref 2.00–3.50)
Protime: 13.2 Seconds (ref 10.6–13.4)

## 2013-04-07 LAB — COMPREHENSIVE METABOLIC PANEL (CC13)
ALT: 20 U/L (ref 0–55)
AST: 15 U/L (ref 5–34)
Alkaline Phosphatase: 125 U/L (ref 40–150)
BUN: 9.7 mg/dL (ref 7.0–26.0)
Calcium: 9.2 mg/dL (ref 8.4–10.4)
Creatinine: 0.7 mg/dL (ref 0.6–1.1)
Total Bilirubin: 0.2 mg/dL (ref 0.20–1.20)

## 2013-04-07 MED ORDER — ONDANSETRON 16 MG/50ML IVPB (CHCC)
16.0000 mg | Freq: Once | INTRAVENOUS | Status: AC
  Administered 2013-04-07: 16 mg via INTRAVENOUS

## 2013-04-07 MED ORDER — SODIUM CHLORIDE 0.9 % IV SOLN
80.0000 mg/m2 | Freq: Once | INTRAVENOUS | Status: AC
  Administered 2013-04-07: 168 mg via INTRAVENOUS
  Filled 2013-04-07: qty 28

## 2013-04-07 MED ORDER — SODIUM CHLORIDE 0.9 % IV SOLN
Freq: Once | INTRAVENOUS | Status: AC
  Administered 2013-04-07: 12:00:00 via INTRAVENOUS

## 2013-04-07 MED ORDER — DEXAMETHASONE SODIUM PHOSPHATE 4 MG/ML IJ SOLN
20.0000 mg | Freq: Once | INTRAMUSCULAR | Status: AC
  Administered 2013-04-07: 20 mg via INTRAVENOUS

## 2013-04-07 MED ORDER — SODIUM CHLORIDE 0.9 % IV SOLN
300.0000 mg | Freq: Once | INTRAVENOUS | Status: AC
  Administered 2013-04-07: 300 mg via INTRAVENOUS
  Filled 2013-04-07: qty 30

## 2013-04-07 MED ORDER — HEPARIN SOD (PORK) LOCK FLUSH 100 UNIT/ML IV SOLN
500.0000 [IU] | Freq: Once | INTRAVENOUS | Status: AC | PRN
  Administered 2013-04-07: 500 [IU]
  Filled 2013-04-07: qty 5

## 2013-04-07 MED ORDER — DEXAMETHASONE 4 MG PO TABS
10.0000 mg | ORAL_TABLET | Freq: Once | ORAL | Status: AC
  Administered 2013-04-07: 10 mg via ORAL

## 2013-04-07 MED ORDER — SODIUM CHLORIDE 0.9 % IJ SOLN
10.0000 mL | INTRAMUSCULAR | Status: DC | PRN
  Administered 2013-04-07: 10 mL
  Filled 2013-04-07: qty 10

## 2013-04-07 MED ORDER — DIPHENHYDRAMINE HCL 50 MG/ML IJ SOLN
50.0000 mg | Freq: Once | INTRAMUSCULAR | Status: AC
  Administered 2013-04-07: 50 mg via INTRAVENOUS

## 2013-04-07 MED ORDER — FAMOTIDINE IN NACL 20-0.9 MG/50ML-% IV SOLN
20.0000 mg | Freq: Once | INTRAVENOUS | Status: AC
  Administered 2013-04-07: 20 mg via INTRAVENOUS

## 2013-04-07 NOTE — Patient Instructions (Addendum)
[facility] Discharge Instructions for Patients Receiving Chemotherapy  Today you received the following chemotherapy agents Taxol and Carboplatin.  To help prevent nausea and vomiting after your treatment, we encourage you to take your nausea medication as ordered per MD.    If you develop nausea and vomiting that is not controlled by your nausea medication, call the clinic. If it is after clinic hours your family physician or the after hours number for the clinic or go to the Emergency Department.   BELOW ARE SYMPTOMS THAT SHOULD BE REPORTED IMMEDIATELY:  *FEVER GREATER THAN 100.5 F  *CHILLS WITH OR WITHOUT FEVER  NAUSEA AND VOMITING THAT IS NOT CONTROLLED WITH YOUR NAUSEA MEDICATION  *UNUSUAL SHORTNESS OF BREATH  *UNUSUAL BRUISING OR BLEEDING  TENDERNESS IN MOUTH AND THROAT WITH OR WITHOUT PRESENCE OF ULCERS  *URINARY PROBLEMS  *BOWEL PROBLEMS  UNUSUAL RASH Items with * indicate a potential emergency and should be followed up as soon as possible.   Please let the nurse know about any problems that you may have experienced. Feel free to call the clinic you have any questions or concerns. The clinic phone number is [phone].   I have been informed and understand all the instructions given to me. I know to contact the clinic, my physician, or go to the Emergency Department if any problems should occur. I do not have any questions at this time, but understand that I may call the clinic during office hours   should I have any questions or need assistance in obtaining follow up care.    __________________________________________  _____________  __________ Signature of Patient or Authorized Representative            Date                   Time    __________________________________________ Nurse's Signature    

## 2013-04-07 NOTE — Progress Notes (Signed)
Taxol started at 1249.  @1325  pt. C/o abdominal cramping identical to last treatment.  She wanted to get up and try and use the bathroom.  Taxol stopped.  Pt. Up to bathroom and returned. Discussed with Dr. Welton Flakes.  VO given and read back to give an additional decadron 10mg  po.  Pt. Requested hot chocolate as this helped before.  Decadron given after hot chocolate.  Hot chocolate relieved cramps within 5 minutes.  Taxol restarted at @ 1444.  Pt. Resting comfortably at this time. Patient states she feels fine at discharge.  Discharged with husband.

## 2013-04-07 NOTE — Patient Instructions (Addendum)
Doing well.  Continue warm compresses/clindamycin ointment for the boils.  Please call us if you have any questions or concerns.

## 2013-04-07 NOTE — Progress Notes (Signed)
OFFICE PROGRESS NOTE  CC Dr. Ovidio Kin Dr. 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 Pierce Crane Dr. Ovidio Kin and Dr. Lurline Hare.Patient originally felt lumpiness in her left breast. This prompted a mammogram.Her mammogram was performed on 08/25/2012 showed pleomorphic calcifications of the entire upper outer left breast measuring 11 x 12 x 15 cm. On exam she had a for a multinodular mass like area over the left upper quadrant. Ultrasound confirmed multiple ill-defined hypoechoic areas. Ultrasound of the left axilla showed a few abnormal appearing lymph nodes. Biopsy of the lymph nodes and the breast was performed on 09/01/2012. 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 The ER was 2% PR 0%.   #2Patient was recommended A mastectomy. 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 Lymphovascular invasion was identified all resection margins were negative the 2 sentinel nodes were removed one was positive for metastatic disease. Patient then went on to have a full axillary lymph node dissection performed the pathology showed 10 lymph nodes negative for metastatic disease therefore 1 of 12 lymph nodes were positive. Final pathologic staging was T T3 N1. Tumor was ER negative PR negative HER-2/neu negative.  #3 patient did have genetic counseling performed but she declined genetic testing at that time. We discussed genetic counseling and testing again in patient is now agreeable and we will get this taken care of.  #4 patient was seen by  Dr. Pierce Crane on 11/17/2012 he recommended PET scan which was negative for metastatic disease. He also recommended adjuvant chemotherapy consisting of FEC given every 2 weeks for a total of 4 cycles. She has now completed the [facility] as of end of March 2014.  #5 on 03/17/2013 patient will begin adjuvant Taxol carboplatinum weekly for a total of 12 weeks.  #5 patient with left lower extremity DVT she is on Lovenox and Coumadin  CURRENT THERAPY:  Taxol carbo week 4  INTERVAL HISTORY: Hannah Hale 47 y.o. female returns for followup visit today prior to adjuvant chemotherapy for her left breast cancer.  She contininues to take Coumadin and is followed by the coumadin clinic.  She takes 5mg  Monday, Wednesday, Friday, and 2.5 mg Tuesday, Thursday, Saturday, Sunday.  She is doing well today.  Her skin lesions continued to be improved with Clindamycin ointment and warm compresses.  She denies fevers, chills, nausea, vomiting, constipation, diarrhea, shortness of breath, pain, she had a twinge of numbness in her left thumb once last week, but it did go away.  Otherwise, a 10 point ROS is neg.   MEDICAL HISTORY: Past Medical History  Diagnosis Date  . Coughing     cold recent   . Breast cancer     left, Sept 20 2013    ALLERGIES:  has No Known Allergies.  MEDICATIONS:  Current Outpatient Prescriptions  Medication Sig Dispense Refill  . clindamycin (CLEOCIN-T) 1 % lotion Apply topically 2 (two) times daily.  60 mL  0  . dexamethasone (DECADRON) 4 MG tablet       . diphenhydrAMINE (SOMINEX) 25 MG tablet Take 50 mg  by mouth 2 (two) times daily as needed. For itching      . lidocaine-prilocaine (EMLA) cream Apply topically as needed.  30 g  6  . LORazepam (ATIVAN) 0.5 MG tablet Take 1 tablet (0.5 mg total) by mouth every 6 (six) hours as needed (for nausea or vomitting).  30 tablet  0  . ondansetron (ZOFRAN) 8 MG tablet       . prochlorperazine (COMPAZINE) 10 MG tablet       . prochlorperazine  (COMPAZINE) 25 MG suppository       . warfarin (COUMADIN) 5 MG tablet Take 1-2 tablets by mouth daily as directed.  60 tablet  1   No current facility-administered medications for this visit.    SURGICAL HISTORY:  Past Surgical History  Procedure Laterality Date  . Simple mastectomy w/ sentinel node biopsy  11/03/2012  . Simple mastectomy with axillary sentinel node biopsy  11/03/2012    Procedure: SIMPLE MASTECTOMY WITH AXILLARY SENTINEL NODE BIOPSY;  Surgeon: Kandis Cocking, MD;  Location: [facility] OR;  Service: General;  Laterality: Left;  . Portacath placement  12/08/2012    Procedure: INSERTION PORT-A-CATH;  Surgeon: Kandis Cocking, MD;  Location: [facility] ORS;  Service: General;  Laterality: N/A;  Power Port Placment and Left Axillary Node Dissection  . Axillary lymph node dissection  12/08/2012    Procedure: AXILLARY LYMPH NODE DISSECTION;  Surgeon: Kandis Cocking, MD;  Location: [facility] ORS;  Service: General;  Laterality: Left;    REVIEW OF SYSTEMS:  General: fatigue (-), night sweats (-), fever (-), pain (-) Lymph: palpable nodes (-) HEENT: vision changes (-), mucositis (-), gum bleeding (-), epistaxis (-) Cardiovascular: chest pain (-), palpitations (-) Pulmonary: shortness of breath (-), dyspnea on exertion (-), cough (-), hemoptysis (-) GI:  Early satiety (-), melena (-), dysphagia (-), nausea/vomiting (-), diarrhea (-) GU: dysuria (-), hematuria (-), incontinence (-) Musculoskeletal: joint swelling (-), joint pain (-), back pain (-) Neuro: weakness (-), numbness (+), headache (-), confusion (-) Skin: Rash (-), lesions (-), dryness (-) Psych: depression (-), suicidal/homicidal ideation (-), feeling of hopelessness (-)  PHYSICAL EXAMINATION: Blood pressure 142/85, pulse 91, temperature 98.3 F (36.8 C), temperature source Oral, resp. rate 20, height 5\' 4"  (1.626 m), weight 209 lb (94.802 kg). Body mass index is 35.86 kg/(m^2). General: Patient is a well appearing female in no acute  distress HEENT: PERRLA, sclerae anicteric no conjunctival pallor, MMM Neck: supple, no palpable adenopathy Lungs: clear to auscultation bilaterally, no wheezes, rhonchi, or rales Cardiovascular: regular rate rhythm, S1, S2, no murmurs, rubs or gallops,  Abdomen: Soft, non-tender, non-distended, normoactive bowel sounds, no HSM Extremities: warm and well perfused, no clubbing, cyanosis, or edema Skin: No rashes or lesions  Neuro: Non-focal Breasts: left mastectomy site without nodularity, well healed, right breast no masses/nodularity ECOG PERFORMANCE STATUS: 0 - Asymptomatic   LABORATORY DATA: Lab Results  Component Value Date   WBC 7.3 04/07/2013   HGB 9.5* 04/07/2013   HCT 29.8* 04/07/2013   MCV 93.7 04/07/2013   PLT 223 04/07/2013      Chemistry      Component Value Date/Time   NA 140 03/31/2013 1021   NA 136 11/04/2012 0637   K 4.0 03/31/2013 1021   K 3.5 11/04/2012 0637   CL 106 03/31/2013 1021   CL 103 11/04/2012 0637   CO2 23 03/31/2013 1021   CO2 22 11/04/2012 0637   BUN 9.3 03/31/2013 1021   BUN 6 11/04/2012 0637   CREATININE  0.6 03/31/2013 1021   CREATININE 0.59 11/04/2012 0637      Component Value Date/Time   CALCIUM 9.1 03/31/2013 1021   CALCIUM 8.4 11/04/2012 0637   ALKPHOS 112 03/31/2013 1021   AST 20 03/31/2013 1021   ALT 19 03/31/2013 1021   BILITOT 0.22 03/31/2013 1021       RADIOGRAPHIC STUDIES:  Dg Chest Port 1 View  12/08/2012  *RADIOLOGY REPORT*  Clinical Data: Post line placement.  Rule out pneumothorax.  The  PORTABLE CHEST - 1 VIEW  Comparison: 11/25/2012 PET CT.  10/26/2012 chest x-ray.  Findings: Curvilinear appearance right apex along the undersurface of the posterior aspect of the right second rib probably related to rib rather than pneumothorax.   This can be assessed on follow-up examination if there are any progressive symptoms to suggest pneumothorax.  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  Cardiomegaly.  Mild central pulmonary vascular  prominence.  Prior left breast surgery and left axillary lymph node dissection.  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  No definitive pneumothorax.  Curvilinear structure right lung apex probably associated with the undersurface of rib as noted above.  This is a call report.   Original Report Authenticated By: Lacy Duverney, M.D.    Dg C-arm 1-60 Min-no Report  12/08/2012  CLINICAL DATA: port-a-cath insertion   C-ARM 1-60 MINUTES  Fluoroscopy was utilized by the requesting physician.  No radiographic  interpretation.      ASSESSMENT: 47 year old female with   #1stage III a( T3 N1) invasive ductal carcinoma she is status post mastectomy with axillary lymph node dissection with one of 12 lymph nodes positive for metastatic disease. Tumor was ER negative PR negative HER-2/neu negative. She had her mastectomy on 11/03/2012 with axillary lymph node dissection performed on 12/08/2012 and she had a Port-A-Cath placed at that time as well. She should have no evidence of metastatic disease elsewhere distally.  #2 patient has completed 4 cycles of FEC that was given dose dense. She reports see this from 01/10/2013 through March 2014.  #3 she will begin Taxol carboplatinum adjuvantly on a weekly basis starting on 03/17/2013 for a total of 12 weeks.  #4 patient now with DVTs she is on Coumadin and Lovenox. She is being followed by the Coumadin clinic.  #5 Folliculitis  PLAN:  #1 Doing well.  Proceed with chemotherapy. She will monitor her fingertips for numbness.  Should this worsen we will recommend Super B complex.    #2 She will continue to take her coumadin under the care of our Coumadin clinic.    #3 We will see Ms. Cwik back for chemotherapy next week.   #4 Near resolved with Climdamycin and BID warm compresses.    All questions were answered. The patient knows to call the clinic with any problems, questions or concerns. We can certainly see the patient much sooner if  necessary.  I spent 25 minutes counseling the patient face to face. The total time spent in the appointment was 30 minutes.  Cherie Ouch Lyn Hollingshead, NP Medical Oncology [facility] Phone: [phone]

## 2013-04-07 NOTE — Progress Notes (Signed)
INR = 1.1 on Coumadin 2.5 mg/day; 5 mg MWF Pt's INR was 0.99 last week & she was kept on same dose due to suspected lab error. Pt has missed 1 dose of Coumadin. She takes Decadron the day of & the day after chemo.  She has no n/v after tx. It is unclear the reason for the drop in her INR after she was stable on the current dose. I will have pt increase her dose to 5 mg daily. Recheck her INR next week w/ tx. Ebony Hail, Pharm.D., CPP 04/07/2013@12 :24 PM

## 2013-04-13 ENCOUNTER — Encounter (INDEPENDENT_AMBULATORY_CARE_PROVIDER_SITE_OTHER): Payer: Self-pay | Admitting: Surgery

## 2013-04-13 ENCOUNTER — Ambulatory Visit (INDEPENDENT_AMBULATORY_CARE_PROVIDER_SITE_OTHER): Payer: 59 | Admitting: Surgery

## 2013-04-13 VITALS — BP 126/78 | HR 104 | Temp 96.5°F | Ht 64.0 in | Wt 207.4 lb

## 2013-04-13 DIAGNOSIS — C50419 Malignant neoplasm of upper-outer quadrant of unspecified female breast: Secondary | ICD-10-CM

## 2013-04-13 DIAGNOSIS — C50412 Malignant neoplasm of upper-outer quadrant of left female breast: Secondary | ICD-10-CM

## 2013-04-13 NOTE — Progress Notes (Addendum)
Re:   Hannah Hale Hale DOB:   [birth year] MRN:   [id number]  [facility]  ASSESSMENT AND PLAN: 1.  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  LVI identified.  1/2 lymph nodes involved.  T3, N1., ER - neg, PR - neg., Ki67 - 30% (Stage IIIA)  Saw [facility] (did see Dr. Rubin)/Dr. Michell Heinrich.  Left completion axillary node dissection/right subclavian power port - 12/08/2012  Final path - 0/10 nodes (so final total was 1/12 nodes)   She is now on her chemotx - FEC, then Taxol/carboplantinum.  Anticipates chemotx through July 1  I'll see her back in 6 months. [Note from [place]: Power port can come out.  DN 11/14/2013]  1a. Lymphedema of left arm - 1/4.  Went to the lymphedema clinic.  Has sleeve and glove, but not with her today.  Overall this is better  2.  Smokes cigarettes - 1/2 pack per day  Has not cut back much 3.  Chronic anemia. 4.  Left leg DVT - on coumadin  REFERRING PHYSICIAN: Provider Not In System  HISTORY OF PRESENT ILLNESS: Hannah Hale Hale is a 47 y.o. (DOB: 02/22/1966)  AA female whose primary care physician is Provider Not In System and through the [facility] for left breast cancer.  She sees Dr. Mitchel Honour at [facility].   She is doing better since I last saw her.  Her left arm looks better.  She went to the lymphedema clinic.  Has a sleeve and glove, but did not wear it today.  She is hoping to finish her chemo by July 1, then it is off to rad tx.  She has not issues for me today.  She is back at work.  She said that some of her hair is coming back.  And it is a little gray.    History of left breast cancer: [name] felt a lumpiness in her left breast recently.  This prompted a mammogram.  She has had no prior breast problems or surgery.  This was her first mammogram. She still is having regular periods. LMP was 3 weeks.  Has no children.  No family history of breast cancer. MRI - 09/08/2012.  The abnormal area in total  measures 17.6(AP) x 8.8 (trv) x 9.4 (CC) cm.   Past Medical History  Diagnosis Date  . Coughing     cold recent   . Breast cancer     left, Sept 20 2013     Current Outpatient Prescriptions  Medication Sig Dispense Refill  . clindamycin (CLEOCIN-T) 1 % lotion Apply topically 2 (two) times daily.  60 mL  0  . dexamethasone (DECADRON) 4 MG tablet       . diphenhydrAMINE (SOMINEX) 25 MG tablet Take 50 mg by mouth 2 (two) times daily as needed. For itching      . lidocaine-prilocaine (EMLA) cream Apply topically as needed.  30 g  6  . LORazepam (ATIVAN) 0.5 MG tablet Take 1 tablet (0.5 mg total) by mouth every 6 (six) hours as needed (for nausea or vomitting).  30 tablet  0  . ondansetron (ZOFRAN) 8 MG tablet       . prochlorperazine (COMPAZINE) 10 MG tablet       . prochlorperazine (COMPAZINE) 25 MG suppository       . warfarin (COUMADIN) 5 MG tablet Take 1-2 tablets by mouth daily as directed.  60 tablet  1   No current facility-administered  medications for this visit.    No Known Allergies  REVIEW OF SYSTEMS: Pulmonary:  Smokes 1/2 ppd. She knows this is bad for her health.  SOCIAL and FAMILY HISTORY: Married.   She works at [employer] as a Scientist, clinical (histocompatibility and immunogenetics). She has a sister, [name].  PHYSICAL EXAM: BP 126/78  Pulse 104  Temp(Src) 96.5 F (35.8 C) (Temporal)  Ht 5\' 4"  (1.626 m)  Wt 207 lb 6.4 oz (94.076 kg)  BMI 35.58 kg/m2  SpO2 98%   General: AA F who is alert and generally healthy appearing.  Breasts:  Left:  Absent.  Wound looks good.  Her left axilla/arm looks much better than when I last saw her.  Right:  No mass.. Chest:  Right subclavian power port. Extremities:  Lymphedema of left arm is  better.  DATA REVIEWED: No new data.  Ovidio Kin, MD,  [facility] [address] [place]  [address]   [place], [place]    [address] Phone:  [phone] FAX:  [phone]

## 2013-04-14 ENCOUNTER — Ambulatory Visit (HOSPITAL_BASED_OUTPATIENT_CLINIC_OR_DEPARTMENT_OTHER): Payer: 59

## 2013-04-14 ENCOUNTER — Ambulatory Visit (HOSPITAL_BASED_OUTPATIENT_CLINIC_OR_DEPARTMENT_OTHER): Payer: 59 | Admitting: Adult Health

## 2013-04-14 ENCOUNTER — Ambulatory Visit: Payer: 59 | Admitting: Pharmacist

## 2013-04-14 ENCOUNTER — Encounter: Payer: Self-pay | Admitting: Adult Health

## 2013-04-14 ENCOUNTER — Other Ambulatory Visit (HOSPITAL_BASED_OUTPATIENT_CLINIC_OR_DEPARTMENT_OTHER): Payer: 59 | Admitting: Lab

## 2013-04-14 VITALS — BP 142/85 | HR 102 | Temp 99.0°F | Resp 20 | Ht 64.0 in | Wt 208.2 lb

## 2013-04-14 DIAGNOSIS — C50412 Malignant neoplasm of upper-outer quadrant of left female breast: Secondary | ICD-10-CM

## 2013-04-14 DIAGNOSIS — C50419 Malignant neoplasm of upper-outer quadrant of unspecified female breast: Secondary | ICD-10-CM

## 2013-04-14 DIAGNOSIS — Z171 Estrogen receptor negative status [ER-]: Secondary | ICD-10-CM

## 2013-04-14 DIAGNOSIS — I82409 Acute embolism and thrombosis of unspecified deep veins of unspecified lower extremity: Secondary | ICD-10-CM

## 2013-04-14 DIAGNOSIS — C773 Secondary and unspecified malignant neoplasm of axilla and upper limb lymph nodes: Secondary | ICD-10-CM

## 2013-04-14 DIAGNOSIS — Z5111 Encounter for antineoplastic chemotherapy: Secondary | ICD-10-CM

## 2013-04-14 DIAGNOSIS — I82402 Acute embolism and thrombosis of unspecified deep veins of left lower extremity: Secondary | ICD-10-CM

## 2013-04-14 LAB — COMPREHENSIVE METABOLIC PANEL (CC13)
Albumin: 3.6 g/dL (ref 3.5–5.0)
BUN: 9.2 mg/dL (ref 7.0–26.0)
CO2: 23 mEq/L (ref 22–29)
Glucose: 182 mg/dl — ABNORMAL HIGH (ref 70–99)
Potassium: 4 mEq/L (ref 3.5–5.1)
Sodium: 141 mEq/L (ref 136–145)
Total Protein: 7.1 g/dL (ref 6.4–8.3)

## 2013-04-14 LAB — CBC WITH DIFFERENTIAL/PLATELET
Basophils Absolute: 0 10*3/uL (ref 0.0–0.1)
Eosinophils Absolute: 0.1 10*3/uL (ref 0.0–0.5)
HCT: 29.4 % — ABNORMAL LOW (ref 34.8–46.6)
HGB: 9.6 g/dL — ABNORMAL LOW (ref 11.6–15.9)
MCH: 30.2 pg (ref 25.1–34.0)
MCV: 92.5 fL (ref 79.5–101.0)
MONO%: 5.4 % (ref 0.0–14.0)
NEUT#: 4.2 10*3/uL (ref 1.5–6.5)
NEUT%: 72 % (ref 38.4–76.8)
RDW: 19.6 % — ABNORMAL HIGH (ref 11.2–14.5)

## 2013-04-14 LAB — PROTIME-INR
INR: 1.2 — ABNORMAL LOW (ref 2.00–3.50)
Protime: 14.4 Seconds — ABNORMAL HIGH (ref 10.6–13.4)

## 2013-04-14 LAB — POCT INR: INR: 1.2

## 2013-04-14 MED ORDER — FAMOTIDINE IN NACL 20-0.9 MG/50ML-% IV SOLN
20.0000 mg | Freq: Once | INTRAVENOUS | Status: AC
  Administered 2013-04-14: 20 mg via INTRAVENOUS

## 2013-04-14 MED ORDER — SODIUM CHLORIDE 0.9 % IJ SOLN
10.0000 mL | INTRAMUSCULAR | Status: DC | PRN
  Administered 2013-04-14: 10 mL
  Filled 2013-04-14: qty 10

## 2013-04-14 MED ORDER — SODIUM CHLORIDE 0.9 % IV SOLN
300.0000 mg | Freq: Once | INTRAVENOUS | Status: AC
  Administered 2013-04-14: 300 mg via INTRAVENOUS
  Filled 2013-04-14: qty 30

## 2013-04-14 MED ORDER — SODIUM CHLORIDE 0.9 % IV SOLN
80.0000 mg/m2 | Freq: Once | INTRAVENOUS | Status: AC
  Administered 2013-04-14: 168 mg via INTRAVENOUS
  Filled 2013-04-14: qty 28

## 2013-04-14 MED ORDER — SODIUM CHLORIDE 0.9 % IV SOLN
Freq: Once | INTRAVENOUS | Status: AC
  Administered 2013-04-14: 11:00:00 via INTRAVENOUS

## 2013-04-14 MED ORDER — DIPHENHYDRAMINE HCL 50 MG/ML IJ SOLN
50.0000 mg | Freq: Once | INTRAMUSCULAR | Status: AC
  Administered 2013-04-14: 50 mg via INTRAVENOUS

## 2013-04-14 MED ORDER — HEPARIN SOD (PORK) LOCK FLUSH 100 UNIT/ML IV SOLN
500.0000 [IU] | Freq: Once | INTRAVENOUS | Status: AC | PRN
  Administered 2013-04-14: 500 [IU]
  Filled 2013-04-14: qty 5

## 2013-04-14 MED ORDER — ONDANSETRON 16 MG/50ML IVPB (CHCC)
16.0000 mg | Freq: Once | INTRAVENOUS | Status: AC
  Administered 2013-04-14: 16 mg via INTRAVENOUS

## 2013-04-14 MED ORDER — DEXAMETHASONE SODIUM PHOSPHATE 20 MG/5ML IJ SOLN
20.0000 mg | Freq: Once | INTRAMUSCULAR | Status: AC
  Administered 2013-04-14: 20 mg via INTRAVENOUS

## 2013-04-14 NOTE — Patient Instructions (Signed)
Doing well.  Proceed with chemotherapy.  Please call us if you have any questions or concerns.    

## 2013-04-14 NOTE — Progress Notes (Signed)
OFFICE PROGRESS NOTE  CC Dr. Ovidio Kin Dr. 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 Pierce Crane Dr. Ovidio Kin and Dr. Lurline Hare.Patient originally felt lumpiness in her left breast. This prompted a mammogram.Her mammogram was performed on 08/25/2012 showed pleomorphic calcifications of the entire upper outer left breast measuring 11 x 12 x 15 cm. On exam she had a for a multinodular mass like area over the left upper quadrant. Ultrasound confirmed multiple ill-defined hypoechoic areas. Ultrasound of the left axilla showed a few abnormal appearing lymph nodes. Biopsy of the lymph nodes and the breast was performed on 09/01/2012. 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 The ER was 2% PR 0%.   #2Patient was recommended A mastectomy. 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 Lymphovascular invasion was identified all resection margins were negative the 2 sentinel nodes were removed one was positive for metastatic disease. Patient then went on to have a full axillary lymph node dissection performed the pathology showed 10 lymph nodes negative for metastatic disease therefore 1 of 12 lymph nodes were positive. Final pathologic staging was T T3 N1. Tumor was ER negative PR negative HER-2/neu negative.  #3 patient did have genetic counseling performed but she declined genetic testing at that time. We discussed genetic counseling and testing again in patient is now agreeable and we will get this taken care of.  #4 patient was seen by  Dr. Pierce Crane on 11/17/2012 he recommended PET scan which was negative for metastatic disease. He also recommended adjuvant chemotherapy consisting of FEC given every 2 weeks for a total of 4 cycles. She has now completed the [facility] as of end of March 2014.  #5 on 03/17/2013 patient will begin adjuvant Taxol carboplatinum weekly for a total of 12 weeks.  #5 patient with left lower extremity DVT she is on Coumadin  CURRENT THERAPY:  Taxol carbo week 5  INTERVAL HISTORY: Hannah Hale 47 y.o. female returns for followup visit today prior to adjuvant chemotherapy for her left breast cancer.  She contininues to take Coumadin and is followed by the coumadin clinic.  She takes 5mg  daily.  She is doing well today.  Her skin lesions continued to be improved with Clindamycin ointment and warm compresses. She continues to have mild numbness in her fingertips.  It is improved from last week.  She has no motor difficulties with her fingertips.  She denies numbness in her feet.  She denies fevers, chills, nausea, vomiting, constipation, diarrhea, pain or any further concerns.    MEDICAL HISTORY: Past Medical History  Diagnosis Date  . Coughing     cold recent   . Breast cancer     left, Sept 20 2013    ALLERGIES:  has No Known Allergies.  MEDICATIONS:  Current Outpatient Prescriptions  Medication Sig Dispense Refill  . clindamycin (CLEOCIN-T) 1 % lotion Apply topically 2 (two) times daily.  60 mL  0  . dexamethasone (DECADRON) 4 MG tablet       . diphenhydrAMINE (SOMINEX) 25 MG tablet Take 50 mg by mouth 2 (  two) times daily as needed. For itching      . lidocaine-prilocaine (EMLA) cream Apply topically as needed.  30 g  6  . LORazepam (ATIVAN) 0.5 MG tablet Take 1 tablet (0.5 mg total) by mouth every 6 (six) hours as needed (for nausea or vomitting).  30 tablet  0  . ondansetron (ZOFRAN) 8 MG tablet       . prochlorperazine (COMPAZINE) 10 MG tablet       . prochlorperazine (COMPAZINE) 25 MG  suppository       . warfarin (COUMADIN) 5 MG tablet Take 1-2 tablets by mouth daily as directed.  60 tablet  1   No current facility-administered medications for this visit.    SURGICAL HISTORY:  Past Surgical History  Procedure Laterality Date  . Simple mastectomy w/ sentinel node biopsy  11/03/2012  . Simple mastectomy with axillary sentinel node biopsy  11/03/2012    Procedure: SIMPLE MASTECTOMY WITH AXILLARY SENTINEL NODE BIOPSY;  Surgeon: Kandis Cocking, MD;  Location: [facility] OR;  Service: General;  Laterality: Left;  . Portacath placement  12/08/2012    Procedure: INSERTION PORT-A-CATH;  Surgeon: Kandis Cocking, MD;  Location: [facility] ORS;  Service: General;  Laterality: N/A;  Power Port Placment and Left Axillary Node Dissection  . Axillary lymph node dissection  12/08/2012    Procedure: AXILLARY LYMPH NODE DISSECTION;  Surgeon: Kandis Cocking, MD;  Location: [facility] ORS;  Service: General;  Laterality: Left;    REVIEW OF SYSTEMS:  General: fatigue (-), night sweats (-), fever (-), pain (-) Lymph: palpable nodes (-) HEENT: vision changes (-), mucositis (-), gum bleeding (-), epistaxis (-) Cardiovascular: chest pain (-), palpitations (-) Pulmonary: shortness of breath (-), dyspnea on exertion (-), cough (-), hemoptysis (-) GI:  Early satiety (-), melena (-), dysphagia (-), nausea/vomiting (-), diarrhea (-) GU: dysuria (-), hematuria (-), incontinence (-) Musculoskeletal: joint swelling (-), joint pain (-), back pain (-) Neuro: weakness (-), numbness (+), headache (-), confusion (-) Skin: Rash (-), lesions (-), dryness (-) Psych: depression (-), suicidal/homicidal ideation (-), feeling of hopelessness (-)  PHYSICAL EXAMINATION: Blood pressure 142/85, pulse 102, temperature 99 F (37.2 C), temperature source Oral, resp. rate 20, height 5\' 4"  (1.626 m), weight 208 lb 3.2 oz (94.439 kg). Body mass index is 35.72 kg/(m^2). General: Patient is a well appearing female in no acute distress HEENT:  PERRLA, sclerae anicteric no conjunctival pallor, MMM Neck: supple, no palpable adenopathy Lungs: clear to auscultation bilaterally, no wheezes, rhonchi, or rales Cardiovascular: regular rate rhythm, S1, S2, no murmurs, rubs or gallops,  Abdomen: Soft, non-tender, non-distended, normoactive bowel sounds, no HSM Extremities: warm and well perfused, no clubbing, cyanosis, or edema Skin: No rashes or lesions  Neuro: Non-focal Breasts: left mastectomy site without nodularity, well healed, right breast no masses/nodularity ECOG PERFORMANCE STATUS: 0 - Asymptomatic   LABORATORY DATA: Lab Results  Component Value Date   WBC 7.3 04/07/2013   HGB 9.5* 04/07/2013   HCT 29.8* 04/07/2013   MCV 93.7 04/07/2013   PLT 223 04/07/2013      Chemistry      Component Value Date/Time   NA 141 04/07/2013 1017   NA 136 11/04/2012 0637   K 3.8 04/07/2013 1017   K 3.5 11/04/2012 0637   CL 106 04/07/2013 1017   CL 103 11/04/2012 0637   CO2 23 04/07/2013 1017   CO2 22 11/04/2012 0637   BUN 9.7 04/07/2013 1017   BUN 6 11/04/2012 0637   CREATININE 0.7  04/07/2013 1017   CREATININE 0.59 11/04/2012 0637      Component Value Date/Time   CALCIUM 9.2 04/07/2013 1017   CALCIUM 8.4 11/04/2012 0637   ALKPHOS 125 04/07/2013 1017   AST 15 04/07/2013 1017   ALT 20 04/07/2013 1017   BILITOT 0.20 04/07/2013 1017       RADIOGRAPHIC STUDIES:  Dg Chest Port 1 View  12/08/2012  *RADIOLOGY REPORT*  Clinical Data: Post line placement.  Rule out pneumothorax.  The  PORTABLE CHEST - 1 VIEW  Comparison: 11/25/2012 PET CT.  10/26/2012 chest x-ray.  Findings: Curvilinear appearance right apex along the undersurface of the posterior aspect of the right second rib probably related to rib rather than pneumothorax.   This can be assessed on follow-up examination if there are any progressive symptoms to suggest pneumothorax.  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  Cardiomegaly.  Mild central pulmonary vascular prominence.   Prior left breast surgery and left axillary lymph node dissection.  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  No definitive pneumothorax.  Curvilinear structure right lung apex probably associated with the undersurface of rib as noted above.  This is a call report.   Original Report Authenticated By: Lacy Duverney, M.D.    Dg C-arm 1-60 Min-no Report  12/08/2012  CLINICAL DATA: port-a-cath insertion   C-ARM 1-60 MINUTES  Fluoroscopy was utilized by the requesting physician.  No radiographic  interpretation.      ASSESSMENT: 47 year old female with   #1stage III a( T3 N1) invasive ductal carcinoma she is status post mastectomy with axillary lymph node dissection with one of 12 lymph nodes positive for metastatic disease. Tumor was ER negative PR negative HER-2/neu negative. She had her mastectomy on 11/03/2012 with axillary lymph node dissection performed on 12/08/2012 and she had a Port-A-Cath placed at that time as well. She should have no evidence of metastatic disease elsewhere distally.  #2 patient has completed 4 cycles of FEC that was given dose dense. She reports see this from 01/10/2013 through March 2014.  #3 she will begin Taxol carboplatinum adjuvantly on a weekly basis starting on 03/17/2013 for a total of 12 weeks.  #4 patient now with DVTs she is on Coumadin and Lovenox. She is being followed by the Coumadin clinic.  #5 Folliculitis  PLAN:  #1 Doing well.  Proceed with chemotherapy. She will monitor her fingertips for numbness.  Today this is slightly improved on its own.  Should this worsen we will recommend Super B complex.    #2 She will continue to take her coumadin under the care of our Coumadin clinic. She is on 5mg  daily.     #3 We will see Ms. Ciriello back for chemotherapy next week.   #4 folliculitis has resolved with topical clindamycin and warm compresses.     All questions were answered. The patient knows to call the clinic with any problems,  questions or concerns. We can certainly see the patient much sooner if necessary.  I spent 25 minutes counseling the patient face to face. The total time spent in the appointment was 30 minutes.  Cherie Ouch Lyn Hollingshead, NP Medical Oncology [facility] Phone: [phone]

## 2013-04-14 NOTE — Progress Notes (Signed)
Pt seen during infusion today. She continues to be subtherapeutic at 1.2 on 5mg  daily. Will increase coumadin to 7.5 mg daily. She is having no symptoms to report and no other changes to meds and diet. We will see her back during infusion on 04/21/13.

## 2013-04-14 NOTE — Patient Instructions (Addendum)
Take Coumadin 7.5 mg daily Recheck INR on 04/21/13.  A pharmacist will see you during tx

## 2013-04-14 NOTE — Patient Instructions (Addendum)
[facility] Discharge Instructions for Patients Receiving Chemotherapy  Today you received the following chemotherapy agents Taxol/Carboplatin.  To help prevent nausea and vomiting after your treatment, we encourage you to take your nausea medication as directed.   If you develop nausea and vomiting that is not controlled by your nausea medication, call the clinic. If it is after clinic hours your family physician or the after hours number for the clinic or go to the Emergency Department.   BELOW ARE SYMPTOMS THAT SHOULD BE REPORTED IMMEDIATELY:  *FEVER GREATER THAN 100.5 F  *CHILLS WITH OR WITHOUT FEVER  NAUSEA AND VOMITING THAT IS NOT CONTROLLED WITH YOUR NAUSEA MEDICATION  *UNUSUAL SHORTNESS OF BREATH  *UNUSUAL BRUISING OR BLEEDING  TENDERNESS IN MOUTH AND THROAT WITH OR WITHOUT PRESENCE OF ULCERS  *URINARY PROBLEMS  *BOWEL PROBLEMS  UNUSUAL RASH Items with * indicate a potential emergency and should be followed up as soon as possible.  Feel free to call the clinic you have any questions or concerns. The clinic phone number is [phone].   

## 2013-04-21 ENCOUNTER — Ambulatory Visit (HOSPITAL_BASED_OUTPATIENT_CLINIC_OR_DEPARTMENT_OTHER): Payer: 59

## 2013-04-21 ENCOUNTER — Ambulatory Visit: Payer: 59 | Admitting: Pharmacist

## 2013-04-21 ENCOUNTER — Other Ambulatory Visit: Payer: Self-pay | Admitting: *Deleted

## 2013-04-21 ENCOUNTER — Other Ambulatory Visit (HOSPITAL_BASED_OUTPATIENT_CLINIC_OR_DEPARTMENT_OTHER): Payer: 59 | Admitting: Lab

## 2013-04-21 ENCOUNTER — Ambulatory Visit (HOSPITAL_BASED_OUTPATIENT_CLINIC_OR_DEPARTMENT_OTHER): Payer: 59 | Admitting: Adult Health

## 2013-04-21 ENCOUNTER — Encounter: Payer: Self-pay | Admitting: Adult Health

## 2013-04-21 VITALS — BP 155/81 | HR 106 | Temp 98.5°F | Resp 20 | Ht 64.0 in | Wt 208.9 lb

## 2013-04-21 DIAGNOSIS — C50412 Malignant neoplasm of upper-outer quadrant of left female breast: Secondary | ICD-10-CM

## 2013-04-21 DIAGNOSIS — L678 Other hair color and hair shaft abnormalities: Secondary | ICD-10-CM

## 2013-04-21 DIAGNOSIS — C50419 Malignant neoplasm of upper-outer quadrant of unspecified female breast: Secondary | ICD-10-CM

## 2013-04-21 DIAGNOSIS — L738 Other specified follicular disorders: Secondary | ICD-10-CM

## 2013-04-21 DIAGNOSIS — Z171 Estrogen receptor negative status [ER-]: Secondary | ICD-10-CM

## 2013-04-21 DIAGNOSIS — I82409 Acute embolism and thrombosis of unspecified deep veins of unspecified lower extremity: Secondary | ICD-10-CM

## 2013-04-21 DIAGNOSIS — Z5111 Encounter for antineoplastic chemotherapy: Secondary | ICD-10-CM

## 2013-04-21 DIAGNOSIS — I82402 Acute embolism and thrombosis of unspecified deep veins of left lower extremity: Secondary | ICD-10-CM

## 2013-04-21 LAB — CBC WITH DIFFERENTIAL/PLATELET
Basophils Absolute: 0 10*3/uL (ref 0.0–0.1)
Eosinophils Absolute: 0 10*3/uL (ref 0.0–0.5)
HCT: 30.1 % — ABNORMAL LOW (ref 34.8–46.6)
HGB: 9.7 g/dL — ABNORMAL LOW (ref 11.6–15.9)
LYMPH%: 26.3 % (ref 14.0–49.7)
MCHC: 32.2 g/dL (ref 31.5–36.0)
MONO#: 0.4 10*3/uL (ref 0.1–0.9)
NEUT#: 3.2 10*3/uL (ref 1.5–6.5)
NEUT%: 64.8 % (ref 38.4–76.8)
Platelets: 221 10*3/uL (ref 145–400)
WBC: 4.9 10*3/uL (ref 3.9–10.3)
lymph#: 1.3 10*3/uL (ref 0.9–3.3)

## 2013-04-21 LAB — COMPREHENSIVE METABOLIC PANEL (CC13)
ALT: 24 U/L (ref 0–55)
Alkaline Phosphatase: 136 U/L (ref 40–150)
Creatinine: 0.6 mg/dL (ref 0.6–1.1)
Glucose: 172 mg/dl — ABNORMAL HIGH (ref 70–99)
Sodium: 140 mEq/L (ref 136–145)
Total Bilirubin: 0.22 mg/dL (ref 0.20–1.20)
Total Protein: 6.9 g/dL (ref 6.4–8.3)

## 2013-04-21 LAB — POCT INR: INR: 2.2

## 2013-04-21 LAB — PROTIME-INR

## 2013-04-21 MED ORDER — SODIUM CHLORIDE 0.9 % IJ SOLN
10.0000 mL | INTRAMUSCULAR | Status: DC | PRN
  Administered 2013-04-21: 10 mL
  Filled 2013-04-21: qty 10

## 2013-04-21 MED ORDER — DEXAMETHASONE SODIUM PHOSPHATE 20 MG/5ML IJ SOLN
20.0000 mg | Freq: Once | INTRAMUSCULAR | Status: AC
  Administered 2013-04-21: 20 mg via INTRAVENOUS

## 2013-04-21 MED ORDER — FAMOTIDINE IN NACL 20-0.9 MG/50ML-% IV SOLN
20.0000 mg | Freq: Once | INTRAVENOUS | Status: AC
  Administered 2013-04-21: 20 mg via INTRAVENOUS

## 2013-04-21 MED ORDER — HEPARIN SOD (PORK) LOCK FLUSH 100 UNIT/ML IV SOLN
500.0000 [IU] | Freq: Once | INTRAVENOUS | Status: AC | PRN
  Administered 2013-04-21: 500 [IU]
  Filled 2013-04-21: qty 5

## 2013-04-21 MED ORDER — PROCHLORPERAZINE MALEATE 10 MG PO TABS: 10.0000 mg | ORAL_TABLET | Freq: Four times a day (QID) | ORAL | Status: DC | PRN

## 2013-04-21 MED ORDER — SODIUM CHLORIDE 0.9 % IV SOLN
Freq: Once | INTRAVENOUS | Status: AC
  Administered 2013-04-21: 12:00:00 via INTRAVENOUS

## 2013-04-21 MED ORDER — ONDANSETRON 16 MG/50ML IVPB (CHCC)
16.0000 mg | Freq: Once | INTRAVENOUS | Status: AC
  Administered 2013-04-21: 16 mg via INTRAVENOUS

## 2013-04-21 MED ORDER — SODIUM CHLORIDE 0.9 % IV SOLN
80.0000 mg/m2 | Freq: Once | INTRAVENOUS | Status: AC
  Administered 2013-04-21: 168 mg via INTRAVENOUS
  Filled 2013-04-21: qty 28

## 2013-04-21 MED ORDER — DIPHENHYDRAMINE HCL 50 MG/ML IJ SOLN
50.0000 mg | Freq: Once | INTRAMUSCULAR | Status: AC
  Administered 2013-04-21: 50 mg via INTRAVENOUS

## 2013-04-21 MED ORDER — SODIUM CHLORIDE 0.9 % IV SOLN
300.0000 mg | Freq: Once | INTRAVENOUS | Status: AC
  Administered 2013-04-21: 300 mg via INTRAVENOUS
  Filled 2013-04-21: qty 30

## 2013-04-21 NOTE — Patient Instructions (Addendum)
Continue Coumadin 7.5 mg daily Recheck INR on 04/28/13: Lab at 9:15am, Apt with Mardella Layman at 9:45am, treatment at 10:45am and coumadin clinic at 11am.   A pharmacist will see you in the infusion area.

## 2013-04-21 NOTE — Progress Notes (Signed)
INR within goal today. No problems to report regarding anticoagulation. Pt is happy that her INR within goal today. Continue Coumadin 7.5 mg daily Recheck INR in 1 week to make sure INR stays within range with recent dose increase. Recheck INR on 04/28/13: Lab at 9:15am, Apt with Mardella Layman at 9:45am, treatment at 10:45am and coumadin clinic at 11am.   A pharmacist will see you in the infusion area.

## 2013-04-21 NOTE — Progress Notes (Signed)
OFFICE PROGRESS NOTE  CC Dr. Ovidio Kin Dr. 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 Pierce Crane Dr. Ovidio Kin and Dr. Lurline Hare.Patient originally felt lumpiness in her left breast. This prompted a mammogram.Her mammogram was performed on 08/25/2012 showed pleomorphic calcifications of the entire upper outer left breast measuring 11 x 12 x 15 cm. On exam she had a for a multinodular mass like area over the left upper quadrant. Ultrasound confirmed multiple ill-defined hypoechoic areas. Ultrasound of the left axilla showed a few abnormal appearing lymph nodes. Biopsy of the lymph nodes and the breast was performed on 09/01/2012. 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 The ER was 2% PR 0%.   #2Patient was recommended A mastectomy. 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 Lymphovascular invasion was identified all resection margins were negative the 2 sentinel nodes were removed one was positive for metastatic disease. Patient then went on to have a full axillary lymph node dissection performed the pathology showed 10 lymph nodes negative for metastatic disease therefore 1 of 12 lymph nodes were positive. Final pathologic staging was T T3 N1. Tumor was ER negative PR negative HER-2/neu negative.  #3 patient did have genetic counseling performed but she declined genetic testing at that time. We discussed genetic counseling and testing again in patient is now agreeable and we will get this taken care of.  #4 patient was seen by  Dr. Pierce Crane on 11/17/2012 he recommended PET scan which was negative for metastatic disease. He also recommended adjuvant chemotherapy consisting of FEC given every 2 weeks for a total of 4 cycles. She has now completed the [facility] as of end of March 2014.  #5 on 03/17/2013 patient will begin adjuvant Taxol carboplatinum weekly for a total of 12 weeks.  #5 patient with left lower extremity DVT she is on Coumadin  CURRENT THERAPY:  Taxol carbo week 6  INTERVAL HISTORY: Hannah Hale 47 y.o. female returns for followup visit today prior to adjuvant chemotherapy for her left breast cancer.  She contininues to take Coumadin and is followed by the coumadin clinic.  Her INR is 2.2 today.  She is feeling well.  She does have an ingrown toenail, but otherwise denies fevers, chills, nausea, vomiting, constipation, diarrhea, numbness, tingling, skin changes or any other concerns.  Her skin lesions have resolved.    MEDICAL HISTORY: Past Medical History  Diagnosis Date  . Coughing     cold recent   . Breast cancer     left, Sept 20 2013    ALLERGIES:  has No Known Allergies.  MEDICATIONS:  Current Outpatient Prescriptions  Medication Sig Dispense Refill  . clindamycin (CLEOCIN-T) 1 % lotion Apply topically 2 (two) times daily.  60 mL  0  . dexamethasone (DECADRON) 4 MG tablet       . diphenhydrAMINE (SOMINEX) 25 MG tablet Take 50 mg by mouth 2 (two) times daily as needed. For itching      . lidocaine-prilocaine (EMLA) cream Apply topically as needed.  30 g  6  . LORazepam (ATIVAN) 0.5  MG tablet Take 1 tablet (0.5 mg total) by mouth every 6 (six) hours as needed (for nausea or vomitting).  30 tablet  0  . ondansetron (ZOFRAN) 8 MG tablet       . prochlorperazine (COMPAZINE) 25 MG suppository       . warfarin (COUMADIN) 5 MG tablet Take 1-2 tablets by mouth daily as directed.  60 tablet  1  . prochlorperazine (COMPAZINE) 10 MG tablet Take 1 tablet (10 mg total) by mouth every 6 (six) hours as  needed.  30 tablet  1   No current facility-administered medications for this visit.    SURGICAL HISTORY:  Past Surgical History  Procedure Laterality Date  . Simple mastectomy w/ sentinel node biopsy  11/03/2012  . Simple mastectomy with axillary sentinel node biopsy  11/03/2012    Procedure: SIMPLE MASTECTOMY WITH AXILLARY SENTINEL NODE BIOPSY;  Surgeon: Kandis Cocking, MD;  Location: [facility] OR;  Service: General;  Laterality: Left;  . Portacath placement  12/08/2012    Procedure: INSERTION PORT-A-CATH;  Surgeon: Kandis Cocking, MD;  Location: [facility] ORS;  Service: General;  Laterality: N/A;  Power Port Placment and Left Axillary Node Dissection  . Axillary lymph node dissection  12/08/2012    Procedure: AXILLARY LYMPH NODE DISSECTION;  Surgeon: Kandis Cocking, MD;  Location: [facility] ORS;  Service: General;  Laterality: Left;    REVIEW OF SYSTEMS:  General: fatigue (-), night sweats (-), fever (-), pain (-) Lymph: palpable nodes (-) HEENT: vision changes (-), mucositis (-), gum bleeding (-), epistaxis (-) Cardiovascular: chest pain (-), palpitations (-) Pulmonary: shortness of breath (-), dyspnea on exertion (-), cough (-), hemoptysis (-) GI:  Early satiety (-), melena (-), dysphagia (-), nausea/vomiting (-), diarrhea (-) GU: dysuria (-), hematuria (-), incontinence (-) Musculoskeletal: joint swelling (-), joint pain (-), back pain (-) Neuro: weakness (-), numbness (-), headache (-), confusion (-) Skin: Rash (-), lesions (-), dryness (-) Psych: depression (-), suicidal/homicidal ideation (-), feeling of hopelessness (-)  PHYSICAL EXAMINATION: Blood pressure 155/81, pulse 106, temperature 98.5 F (36.9 C), temperature source Oral, resp. rate 20, height 5\' 4"  (1.626 m), weight 208 lb 14.4 oz (94.756 kg). Body mass index is 35.84 kg/(m^2). General: Patient is a well appearing female in no acute distress HEENT: PERRLA, sclerae anicteric no conjunctival pallor, MMM Neck: supple, no palpable  adenopathy Lungs: clear to auscultation bilaterally, no wheezes, rhonchi, or rales Cardiovascular: regular rate rhythm, S1, S2, no murmurs, rubs or gallops,  Abdomen: Soft, non-tender, non-distended, normoactive bowel sounds, no HSM Extremities: warm and well perfused, no clubbing, cyanosis, or edema Skin: No rashes or lesions  Neuro: Non-focal Breasts: left mastectomy site without nodularity, well healed, right breast no masses/nodularity ECOG PERFORMANCE STATUS: 0 - Asymptomatic   LABORATORY DATA: Lab Results  Component Value Date   WBC 4.9 04/21/2013   HGB 9.7* 04/21/2013   HCT 30.1* 04/21/2013   MCV 93.2 04/21/2013   PLT 221 04/21/2013      Chemistry      Component Value Date/Time   NA 140 04/21/2013 1012   NA 136 11/04/2012 0637   K 3.8 04/21/2013 1012   K 3.5 11/04/2012 0637   CL 104 04/21/2013 1012   CL 103 11/04/2012 0637   CO2 23 04/21/2013 1012   CO2 22 11/04/2012 0637   BUN 10.2 04/21/2013 1012   BUN 6 11/04/2012 0637   CREATININE 0.6 04/21/2013 1012   CREATININE 0.59 11/04/2012 0637      Component Value Date/Time  CALCIUM 9.1 04/21/2013 1012   CALCIUM 8.4 11/04/2012 0637   ALKPHOS 136 04/21/2013 1012   AST 23 04/21/2013 1012   ALT 24 04/21/2013 1012   BILITOT 0.22 04/21/2013 1012       RADIOGRAPHIC STUDIES:  Dg Chest Port 1 View  12/08/2012  *RADIOLOGY REPORT*  Clinical Data: Post line placement.  Rule out pneumothorax.  The  PORTABLE CHEST - 1 VIEW  Comparison: 11/25/2012 PET CT.  10/26/2012 chest x-ray.  Findings: Curvilinear appearance right apex along the undersurface of the posterior aspect of the right second rib probably related to rib rather than pneumothorax.   This can be assessed on follow-up examination if there are any progressive symptoms to suggest pneumothorax.  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  Cardiomegaly.  Mild central pulmonary vascular prominence.  Prior left breast surgery and left axillary lymph node dissection.  IMPRESSION: Right central line  tip distal superior vena cava.  No definitive pneumothorax.  Curvilinear structure right lung apex probably associated with the undersurface of rib as noted above.  This is a call report.   Original Report Authenticated By: Lacy Duverney, M.D.    Dg C-arm 1-60 Min-no Report  12/08/2012  CLINICAL DATA: port-a-cath insertion   C-ARM 1-60 MINUTES  Fluoroscopy was utilized by the requesting physician.  No radiographic  interpretation.      ASSESSMENT: 47 year old female with   #1stage III a( T3 N1) invasive ductal carcinoma she is status post mastectomy with axillary lymph node dissection with one of 12 lymph nodes positive for metastatic disease. Tumor was ER negative PR negative HER-2/neu negative. She had her mastectomy on 11/03/2012 with axillary lymph node dissection performed on 12/08/2012 and she had a Port-A-Cath placed at that time as well. She should have no evidence of metastatic disease elsewhere distally.  #2 patient has completed 4 cycles of FEC that was given dose dense. She reports see this from 01/10/2013 through March 2014.  #3 she will begin Taxol carboplatinum adjuvantly on a weekly basis starting on 03/17/2013 for a total of 12 weeks.  #4 patient now with DVTs she is on Coumadin and Lovenox. She is being followed by the Coumadin clinic.  #5 Folliculitis  PLAN:  #1 Doing well.  Proceed with chemotherapy. She will monitor her fingertips for numbness.  This is has resolved.      #2 She will continue to take her coumadin under the care of our Coumadin clinic. Her INR is 2.2 today.       #3 We will see Hannah Hale back for chemotherapy next week.   #4 folliculitis has resolved with topical clindamycin and warm compresses.     All questions were answered. The patient knows to call the clinic with any problems, questions or concerns. We can certainly see the patient much sooner if necessary.  I spent 25 minutes counseling the patient face to face. The total time spent in the  appointment was 30 minutes.  Cherie Ouch Lyn Hollingshead, NP Medical Oncology [facility] Phone: [phone]

## 2013-04-21 NOTE — Patient Instructions (Signed)
Doing well.  Proceed with chemotherapy.  Please call us if you have any questions or concerns.    

## 2013-04-21 NOTE — Patient Instructions (Addendum)
[facility] Discharge Instructions for Patients Receiving Chemotherapy  Today you received the following chemotherapy agents : Taxol/Carboplatin To help prevent nausea and vomiting after your treatment, we encourage you to take your nausea medication as often as prescribed.   If you develop nausea and vomiting that is not controlled by your nausea medication, call the clinic.    BELOW ARE SYMPTOMS THAT SHOULD BE REPORTED IMMEDIATELY:  *FEVER GREATER THAN 100.5 F  *CHILLS WITH OR WITHOUT FEVER  NAUSEA AND VOMITING THAT IS NOT CONTROLLED WITH YOUR NAUSEA MEDICATION  *UNUSUAL SHORTNESS OF BREATH  *UNUSUAL BRUISING OR BLEEDING  TENDERNESS IN MOUTH AND THROAT WITH OR WITHOUT PRESENCE OF ULCERS  *URINARY PROBLEMS  *BOWEL PROBLEMS  UNUSUAL RASH Items with * indicate a potential emergency and should be followed up as soon as possible.   The clinic phone number is [phone].

## 2013-04-28 ENCOUNTER — Encounter: Payer: Self-pay | Admitting: Adult Health

## 2013-04-28 ENCOUNTER — Telehealth: Payer: Self-pay | Admitting: Oncology

## 2013-04-28 ENCOUNTER — Ambulatory Visit: Payer: 59 | Admitting: Pharmacist

## 2013-04-28 ENCOUNTER — Ambulatory Visit (HOSPITAL_BASED_OUTPATIENT_CLINIC_OR_DEPARTMENT_OTHER): Payer: 59

## 2013-04-28 ENCOUNTER — Ambulatory Visit (HOSPITAL_BASED_OUTPATIENT_CLINIC_OR_DEPARTMENT_OTHER): Payer: 59 | Admitting: Adult Health

## 2013-04-28 ENCOUNTER — Other Ambulatory Visit (HOSPITAL_BASED_OUTPATIENT_CLINIC_OR_DEPARTMENT_OTHER): Payer: 59 | Admitting: Lab

## 2013-04-28 VITALS — BP 141/87 | HR 98 | Temp 98.4°F | Resp 20 | Ht 64.0 in | Wt 209.3 lb

## 2013-04-28 DIAGNOSIS — L600 Ingrowing nail: Secondary | ICD-10-CM

## 2013-04-28 DIAGNOSIS — C50412 Malignant neoplasm of upper-outer quadrant of left female breast: Secondary | ICD-10-CM

## 2013-04-28 DIAGNOSIS — C50419 Malignant neoplasm of upper-outer quadrant of unspecified female breast: Secondary | ICD-10-CM

## 2013-04-28 DIAGNOSIS — Z5111 Encounter for antineoplastic chemotherapy: Secondary | ICD-10-CM

## 2013-04-28 DIAGNOSIS — I82402 Acute embolism and thrombosis of unspecified deep veins of left lower extremity: Secondary | ICD-10-CM

## 2013-04-28 DIAGNOSIS — C773 Secondary and unspecified malignant neoplasm of axilla and upper limb lymph nodes: Secondary | ICD-10-CM

## 2013-04-28 DIAGNOSIS — I82409 Acute embolism and thrombosis of unspecified deep veins of unspecified lower extremity: Secondary | ICD-10-CM

## 2013-04-28 LAB — CBC WITH DIFFERENTIAL/PLATELET
BASO%: 0.7 % (ref 0.0–2.0)
EOS%: 1 % (ref 0.0–7.0)
LYMPH%: 32.9 % (ref 14.0–49.7)
MCHC: 31.5 g/dL (ref 31.5–36.0)
MCV: 93.6 fL (ref 79.5–101.0)
MONO%: 6 % (ref 0.0–14.0)
Platelets: 173 10*3/uL (ref 145–400)
RBC: 2.98 10*6/uL — ABNORMAL LOW (ref 3.70–5.45)
WBC: 4.2 10*3/uL (ref 3.9–10.3)
nRBC: 0 % (ref 0–0)

## 2013-04-28 LAB — COMPREHENSIVE METABOLIC PANEL (CC13)
Alkaline Phosphatase: 109 U/L (ref 40–150)
Glucose: 137 mg/dl — ABNORMAL HIGH (ref 70–99)
Sodium: 141 mEq/L (ref 136–145)
Total Bilirubin: <0.2 mg/dL (ref 0.20–1.20)
Total Protein: 5.9 g/dL — ABNORMAL LOW (ref 6.4–8.3)

## 2013-04-28 LAB — PROTIME-INR: Protime: 34.8 Seconds — ABNORMAL HIGH (ref 10.6–13.4)

## 2013-04-28 MED ORDER — DEXAMETHASONE SODIUM PHOSPHATE 20 MG/5ML IJ SOLN
20.0000 mg | Freq: Once | INTRAMUSCULAR | Status: AC
Start: 2013-04-28 — End: 2013-04-28
  Administered 2013-04-28: 20 mg via INTRAVENOUS

## 2013-04-28 MED ORDER — SODIUM CHLORIDE 0.9 % IV SOLN
300.0000 mg | Freq: Once | INTRAVENOUS | Status: AC
  Administered 2013-04-28: 300 mg via INTRAVENOUS
  Filled 2013-04-28: qty 30

## 2013-04-28 MED ORDER — FAMOTIDINE IN NACL 20-0.9 MG/50ML-% IV SOLN
20.0000 mg | Freq: Once | INTRAVENOUS | Status: AC
  Administered 2013-04-28: 20 mg via INTRAVENOUS

## 2013-04-28 MED ORDER — DIPHENHYDRAMINE HCL 50 MG/ML IJ SOLN
50.0000 mg | Freq: Once | INTRAMUSCULAR | Status: AC
Start: 2013-04-28 — End: 2013-04-28
  Administered 2013-04-28: 50 mg via INTRAVENOUS

## 2013-04-28 MED ORDER — SODIUM CHLORIDE 0.9 % IV SOLN
80.0000 mg/m2 | Freq: Once | INTRAVENOUS | Status: AC
  Administered 2013-04-28: 168 mg via INTRAVENOUS
  Filled 2013-04-28: qty 28

## 2013-04-28 MED ORDER — ONDANSETRON 16 MG/50ML IVPB (CHCC)
16.0000 mg | Freq: Once | INTRAVENOUS | Status: AC
  Administered 2013-04-28: 16 mg via INTRAVENOUS

## 2013-04-28 MED ORDER — SODIUM CHLORIDE 0.9 % IJ SOLN
10.0000 mL | INTRAMUSCULAR | Status: DC | PRN
  Administered 2013-04-28: 10 mL
  Filled 2013-04-28: qty 10

## 2013-04-28 MED ORDER — HEPARIN SOD (PORK) LOCK FLUSH 100 UNIT/ML IV SOLN
500.0000 [IU] | Freq: Once | INTRAVENOUS | Status: AC | PRN
  Administered 2013-04-28: 500 [IU]
  Filled 2013-04-28: qty 5

## 2013-04-28 MED ORDER — SODIUM CHLORIDE 0.9 % IV SOLN
Freq: Once | INTRAVENOUS | Status: AC
  Administered 2013-04-28: 12:00:00 via INTRAVENOUS

## 2013-04-28 NOTE — Patient Instructions (Addendum)
Doing well.  Proceed with chemotherapy.  Please go see podiatry.  Call us if you have any drainage from the toe.    We will see you back next week or earlier if needed.

## 2013-04-28 NOTE — Progress Notes (Signed)
Pt seen during infusion today.   She is therapeutic at 2.9 today. She will continue 7.5 mg daily. No changes to report. We will see her in infusion in two weeks.

## 2013-04-28 NOTE — Progress Notes (Signed)
OFFICE PROGRESS NOTE  CC Dr. Ovidio Kin Dr. 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 Pierce Crane Dr. Ovidio Kin and Dr. Lurline Hare.Patient originally felt lumpiness in her left breast. This prompted a mammogram.Her mammogram was performed on 08/25/2012 showed pleomorphic calcifications of the entire upper outer left breast measuring 11 x 12 x 15 cm. On exam she had a for a multinodular mass like area over the left upper quadrant. Ultrasound confirmed multiple ill-defined hypoechoic areas. Ultrasound of the left axilla showed a few abnormal appearing lymph nodes. Biopsy of the lymph nodes and the breast was performed on 09/01/2012. 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 The ER was 2% PR 0%.   #2Patient was recommended A mastectomy. 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 Lymphovascular invasion was identified all resection margins were negative the 2 sentinel nodes were removed one was positive for metastatic disease. Patient then went on to have a full axillary lymph node dissection performed the pathology showed 10 lymph nodes negative for metastatic disease therefore 1 of 12 lymph nodes were positive. Final pathologic staging was T T3 N1. Tumor was ER negative PR negative HER-2/neu negative.  #3 patient did have genetic counseling performed but she declined genetic testing at that time. We discussed genetic counseling and testing again in patient is now agreeable and we will get this taken care of.  #4 patient was seen by  Dr. Pierce Crane on 11/17/2012 he recommended PET scan which was negative for metastatic disease. He also recommended adjuvant chemotherapy consisting of FEC given every 2 weeks for a total of 4 cycles. She has now completed the [facility] as of end of March 2014.  #5 on 03/17/2013 patient will begin adjuvant Taxol carboplatinum weekly for a total of 12 weeks.  #5 patient with left lower extremity DVT she is on Coumadin  CURRENT THERAPY:  Taxol carbo week 7  INTERVAL HISTORY: Hannah Hale 47 y.o. female returns for followup visit today prior to adjuvant chemotherapy for her left breast cancer.  She contininues to take Coumadin and is followed by the coumadin clinic.  Her INR is 2.9 today.  She is feeling well.  She has mild numbness in the tips of her thumbs bilaterally and her great toes.  She has no motor deficit in her thumbs and great toes.  She continues to have an ingrown toenail, but otherwise denies fevers, chills, nausea, vomiting, constipation, diarrhea, skin changes or any other concerns.    MEDICAL HISTORY: Past Medical History  Diagnosis Date  . Coughing     cold recent   . Breast cancer     left, Sept 20 2013    ALLERGIES:  has No Known Allergies.  MEDICATIONS:  Current Outpatient Prescriptions  Medication Sig Dispense Refill  . clindamycin (CLEOCIN-T) 1 % lotion Apply topically 2 (two) times daily.  60 mL  0  . dexamethasone (DECADRON) 4 MG tablet       . diphenhydrAMINE (SOMINEX) 25 MG tablet Take 50 mg by mouth 2 (two) times daily as needed. For itching      .  lidocaine-prilocaine (EMLA) cream Apply topically as needed.  30 g  6  . LORazepam (ATIVAN) 0.5 MG tablet Take 1 tablet (0.5 mg total) by mouth every 6 (six) hours as needed (for nausea or vomitting).  30 tablet  0  . ondansetron (ZOFRAN) 8 MG tablet       . prochlorperazine (COMPAZINE) 10 MG tablet Take 1 tablet (10 mg total) by mouth every 6 (six) hours as needed.  30 tablet  1  . prochlorperazine (COMPAZINE) 25  MG suppository       . warfarin (COUMADIN) 5 MG tablet Take 1-2 tablets by mouth daily as directed.  60 tablet  1   No current facility-administered medications for this visit.    SURGICAL HISTORY:  Past Surgical History  Procedure Laterality Date  . Simple mastectomy w/ sentinel node biopsy  11/03/2012  . Simple mastectomy with axillary sentinel node biopsy  11/03/2012    Procedure: SIMPLE MASTECTOMY WITH AXILLARY SENTINEL NODE BIOPSY;  Surgeon: Kandis Cocking, MD;  Location: [facility] OR;  Service: General;  Laterality: Left;  . Portacath placement  12/08/2012    Procedure: INSERTION PORT-A-CATH;  Surgeon: Kandis Cocking, MD;  Location: [facility] ORS;  Service: General;  Laterality: N/A;  Power Port Placment and Left Axillary Node Dissection  . Axillary lymph node dissection  12/08/2012    Procedure: AXILLARY LYMPH NODE DISSECTION;  Surgeon: Kandis Cocking, MD;  Location: [facility] ORS;  Service: General;  Laterality: Left;    REVIEW OF SYSTEMS:  General: fatigue (-), night sweats (-), fever (-), pain (-) Lymph: palpable nodes (-) HEENT: vision changes (-), mucositis (-), gum bleeding (-), epistaxis (-) Cardiovascular: chest pain (-), palpitations (-) Pulmonary: shortness of breath (-), dyspnea on exertion (-), cough (-), hemoptysis (-) GI:  Early satiety (-), melena (-), dysphagia (-), nausea/vomiting (-), diarrhea (-) GU: dysuria (-), hematuria (-), incontinence (-) Musculoskeletal: joint swelling (-), joint pain (-), back pain (-) Neuro: weakness (-), numbness (-), headache (-), confusion (-) Skin: Rash (-), lesions (-), dryness (-) Psych: depression (-), suicidal/homicidal ideation (-), feeling of hopelessness (-)  PHYSICAL EXAMINATION: Blood pressure 141/87, pulse 98, temperature 98.4 F (36.9 C), temperature source Oral, resp. rate 20, height 5\' 4"  (1.626 m), weight 209 lb 4.8 oz (94.938 kg). Body mass index is 35.91 kg/(m^2). General: Patient is a well appearing female in no acute  distress HEENT: PERRLA, sclerae anicteric no conjunctival pallor, MMM Neck: supple, no palpable adenopathy Lungs: clear to auscultation bilaterally, no wheezes, rhonchi, or rales Cardiovascular: regular rate rhythm, S1, S2, no murmurs, rubs or gallops,  Abdomen: Soft, non-tender, non-distended, normoactive bowel sounds, no HSM Extremities: warm and well perfused, no clubbing, cyanosis, or edema Skin: No rashes or lesions  Neuro: Non-focal Breasts: left mastectomy site without nodularity, well healed, right breast no masses/nodularity ECOG PERFORMANCE STATUS: 0 - Asymptomatic   LABORATORY DATA: Lab Results  Component Value Date   WBC 4.2 04/28/2013   HGB 8.8* 04/28/2013   HCT 27.9* 04/28/2013   MCV 93.6 04/28/2013   PLT 173 04/28/2013      Chemistry      Component Value Date/Time   NA 140 04/21/2013 1012   NA 136 11/04/2012 0637   K 3.8 04/21/2013 1012   K 3.5 11/04/2012 0637   CL 104 04/21/2013 1012   CL 103 11/04/2012 0637   CO2 23 04/21/2013 1012   CO2 22 11/04/2012 0637   BUN 10.2 04/21/2013 1012   BUN 6 11/04/2012 0637   CREATININE  0.6 04/21/2013 1012   CREATININE 0.59 11/04/2012 0637      Component Value Date/Time   CALCIUM 9.1 04/21/2013 1012   CALCIUM 8.4 11/04/2012 0637   ALKPHOS 136 04/21/2013 1012   AST 23 04/21/2013 1012   ALT 24 04/21/2013 1012   BILITOT 0.22 04/21/2013 1012       RADIOGRAPHIC STUDIES:  Dg Chest Port 1 View  12/08/2012  *RADIOLOGY REPORT*  Clinical Data: Post line placement.  Rule out pneumothorax.  The  PORTABLE CHEST - 1 VIEW  Comparison: 11/25/2012 PET CT.  10/26/2012 chest x-ray.  Findings: Curvilinear appearance right apex along the undersurface of the posterior aspect of the right second rib probably related to rib rather than pneumothorax.   This can be assessed on follow-up examination if there are any progressive symptoms to suggest pneumothorax.  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  Cardiomegaly.  Mild central pulmonary vascular  prominence.  Prior left breast surgery and left axillary lymph node dissection.  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  No definitive pneumothorax.  Curvilinear structure right lung apex probably associated with the undersurface of rib as noted above.  This is a call report.   Original Report Authenticated By: Lacy Duverney, M.D.    Dg C-arm 1-60 Min-no Report  12/08/2012  CLINICAL DATA: port-a-cath insertion   C-ARM 1-60 MINUTES  Fluoroscopy was utilized by the requesting physician.  No radiographic  interpretation.      ASSESSMENT: 47 year old female with   #1stage III a( T3 N1) invasive ductal carcinoma she is status post mastectomy with axillary lymph node dissection with one of 12 lymph nodes positive for metastatic disease. Tumor was ER negative PR negative HER-2/neu negative. She had her mastectomy on 11/03/2012 with axillary lymph node dissection performed on 12/08/2012 and she had a Port-A-Cath placed at that time as well. She should have no evidence of metastatic disease elsewhere distally.  #2 patient has completed 4 cycles of FEC that was given dose dense. She reports see this from 01/10/2013 through March 2014.  #3 she will begin Taxol carboplatinum adjuvantly on a weekly basis starting on 03/17/2013 for a total of 12 weeks.  #4 patient now with DVTs she is on Coumadin and Lovenox. She is being followed by the Coumadin clinic.  #5 Folliculitis  PLAN:  #1 Doing well.  Proceed with chemotherapy.   #2 She will continue to take her coumadin under the care of our Coumadin clinic. Her INR is 2.9 today.       #3 We will see Hannah Hale back for chemotherapy next week.   #4 I have requested a podiatry referral for her ingrown toenail.  Should she start to have any redness or drainage from the toe she will call us and we will start antibiotics.     All questions were answered. The patient knows to call the clinic with any problems, questions or concerns. We can  certainly see the patient much sooner if necessary.  I spent 25 minutes counseling the patient face to face. The total time spent in the appointment was 30 minutes.  Cherie Ouch Lyn Hollingshead, NP Medical Oncology [facility] Phone: [phone]

## 2013-04-28 NOTE — Patient Instructions (Signed)
Continue Coumadin 7.5 mg daily Recheck INR on 05/12/13.  A pharmacist will see you in the infusion area around 12:00.

## 2013-04-28 NOTE — Telephone Encounter (Signed)
, °

## 2013-04-28 NOTE — Patient Instructions (Signed)
[facility] Discharge Instructions for Patients Receiving Chemotherapy  Today you received the following chemotherapy agents :  Taxol,  Carboplatin.  To help prevent nausea and vomiting after your treatment, we encourage you to take your nausea medication as instructed by your physician.    If you develop nausea and vomiting that is not controlled by your nausea medication, call the clinic. If it is after clinic hours your family physician or the after hours number for the clinic or go to the Emergency Department.   BELOW ARE SYMPTOMS THAT SHOULD BE REPORTED IMMEDIATELY:  *FEVER GREATER THAN 100.5 F  *CHILLS WITH OR WITHOUT FEVER  NAUSEA AND VOMITING THAT IS NOT CONTROLLED WITH YOUR NAUSEA MEDICATION  *UNUSUAL SHORTNESS OF BREATH  *UNUSUAL BRUISING OR BLEEDING  TENDERNESS IN MOUTH AND THROAT WITH OR WITHOUT PRESENCE OF ULCERS  *URINARY PROBLEMS  *BOWEL PROBLEMS  UNUSUAL RASH Items with * indicate a potential emergency and should be followed up as soon as possible.  One of the nurses will contact you 24 hours after your treatment. Please let the nurse know about any problems that you may have experienced. Feel free to call the clinic you have any questions or concerns. The clinic phone number is [phone].   I have been informed and understand all the instructions given to me. I know to contact the clinic, my physician, or go to the Emergency Department if any problems should occur. I do not have any questions at this time, but understand that I may call the clinic during office hours   should I have any questions or need assistance in obtaining follow up care.    __________________________________________  _____________  __________ Signature of Patient or Authorized Representative            Date                   Time    __________________________________________ Nurse's Signature    

## 2013-04-28 NOTE — Progress Notes (Signed)
Ok to treat despite HGB 8.8, verbal order rec'd from Augustin Schooling, NP and read back.

## 2013-05-04 ENCOUNTER — Telehealth: Payer: Self-pay | Admitting: Oncology

## 2013-05-04 ENCOUNTER — Ambulatory Visit (HOSPITAL_BASED_OUTPATIENT_CLINIC_OR_DEPARTMENT_OTHER): Payer: 59 | Admitting: Adult Health

## 2013-05-04 ENCOUNTER — Encounter: Payer: Self-pay | Admitting: Adult Health

## 2013-05-04 ENCOUNTER — Other Ambulatory Visit (HOSPITAL_BASED_OUTPATIENT_CLINIC_OR_DEPARTMENT_OTHER): Payer: 59 | Admitting: Lab

## 2013-05-04 ENCOUNTER — Encounter: Payer: Self-pay | Admitting: Pharmacist

## 2013-05-04 ENCOUNTER — Telehealth: Payer: Self-pay | Admitting: *Deleted

## 2013-05-04 ENCOUNTER — Ambulatory Visit (HOSPITAL_BASED_OUTPATIENT_CLINIC_OR_DEPARTMENT_OTHER): Payer: 59

## 2013-05-04 VITALS — BP 138/83 | HR 94 | Temp 98.4°F | Resp 20 | Ht 64.0 in | Wt 206.1 lb

## 2013-05-04 DIAGNOSIS — C773 Secondary and unspecified malignant neoplasm of axilla and upper limb lymph nodes: Secondary | ICD-10-CM

## 2013-05-04 DIAGNOSIS — C50412 Malignant neoplasm of upper-outer quadrant of left female breast: Secondary | ICD-10-CM

## 2013-05-04 DIAGNOSIS — C50419 Malignant neoplasm of upper-outer quadrant of unspecified female breast: Secondary | ICD-10-CM

## 2013-05-04 DIAGNOSIS — Z5111 Encounter for antineoplastic chemotherapy: Secondary | ICD-10-CM

## 2013-05-04 DIAGNOSIS — L600 Ingrowing nail: Secondary | ICD-10-CM

## 2013-05-04 DIAGNOSIS — Z86718 Personal history of other venous thrombosis and embolism: Secondary | ICD-10-CM

## 2013-05-04 LAB — CBC WITH DIFFERENTIAL/PLATELET
BASO%: 0.6 % (ref 0.0–2.0)
Eosinophils Absolute: 0 10*3/uL (ref 0.0–0.5)
MCHC: 32.3 g/dL (ref 31.5–36.0)
MCV: 93.6 fL (ref 79.5–101.0)
MONO%: 7.9 % (ref 0.0–14.0)
NEUT#: 1.9 10*3/uL (ref 1.5–6.5)
RBC: 2.98 10*6/uL — ABNORMAL LOW (ref 3.70–5.45)
RDW: 20.1 % — ABNORMAL HIGH (ref 11.2–14.5)
WBC: 3.6 10*3/uL — ABNORMAL LOW (ref 3.9–10.3)
nRBC: 0 % (ref 0–0)

## 2013-05-04 LAB — COMPREHENSIVE METABOLIC PANEL (CC13)
ALT: 28 U/L (ref 0–55)
AST: 26 U/L (ref 5–34)
Alkaline Phosphatase: 124 U/L (ref 40–150)
CO2: 23 mEq/L (ref 22–29)
Creatinine: 0.7 mg/dL (ref 0.6–1.1)
Sodium: 141 mEq/L (ref 136–145)
Total Bilirubin: 0.24 mg/dL (ref 0.20–1.20)
Total Protein: 7.1 g/dL (ref 6.4–8.3)

## 2013-05-04 MED ORDER — DEXAMETHASONE SODIUM PHOSPHATE 20 MG/5ML IJ SOLN
20.0000 mg | Freq: Once | INTRAMUSCULAR | Status: AC
  Administered 2013-05-04: 20 mg via INTRAVENOUS

## 2013-05-04 MED ORDER — DIPHENHYDRAMINE HCL 50 MG/ML IJ SOLN
50.0000 mg | Freq: Once | INTRAMUSCULAR | Status: AC
  Administered 2013-05-04: 50 mg via INTRAVENOUS

## 2013-05-04 MED ORDER — FAMOTIDINE IN NACL 20-0.9 MG/50ML-% IV SOLN
20.0000 mg | Freq: Once | INTRAVENOUS | Status: AC
  Administered 2013-05-04: 20 mg via INTRAVENOUS

## 2013-05-04 MED ORDER — ONDANSETRON 16 MG/50ML IVPB (CHCC)
16.0000 mg | Freq: Once | INTRAVENOUS | Status: AC
  Administered 2013-05-04: 16 mg via INTRAVENOUS

## 2013-05-04 MED ORDER — SODIUM CHLORIDE 0.9 % IV SOLN
80.0000 mg/m2 | Freq: Once | INTRAVENOUS | Status: AC
  Administered 2013-05-04: 168 mg via INTRAVENOUS
  Filled 2013-05-04: qty 28

## 2013-05-04 MED ORDER — SODIUM CHLORIDE 0.9 % IJ SOLN
10.0000 mL | INTRAMUSCULAR | Status: DC | PRN
  Administered 2013-05-04: 10 mL
  Filled 2013-05-04: qty 10

## 2013-05-04 MED ORDER — SODIUM CHLORIDE 0.9 % IV SOLN
300.0000 mg | Freq: Once | INTRAVENOUS | Status: AC
  Administered 2013-05-04: 300 mg via INTRAVENOUS
  Filled 2013-05-04: qty 30

## 2013-05-04 MED ORDER — SODIUM CHLORIDE 0.9 % IV SOLN
Freq: Once | INTRAVENOUS | Status: AC
  Administered 2013-05-04: 11:00:00 via INTRAVENOUS

## 2013-05-04 MED ORDER — HEPARIN SOD (PORK) LOCK FLUSH 100 UNIT/ML IV SOLN
500.0000 [IU] | Freq: Once | INTRAVENOUS | Status: AC | PRN
  Administered 2013-05-04: 500 [IU]
  Filled 2013-05-04: qty 5

## 2013-05-04 NOTE — Progress Notes (Signed)
Call to pharmacy from Augustin Schooling- pt is planning to have a toenail removed.  Mardella Layman & Dr Welton Flakes would like to postpone this as long as possible.  Follow along for scheduled procedure date, if set. Ebony Hail, Pharm.D., CPP 05/04/2013@11 :08 AM

## 2013-05-04 NOTE — Telephone Encounter (Signed)
Per staff message and POF I have scheduled appts.  JMW  

## 2013-05-04 NOTE — Progress Notes (Signed)
OFFICE PROGRESS NOTE  CC Dr. Ovidio Kin Dr. 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 Pierce Crane Dr. Ovidio Kin and Dr. Lurline Hare.Patient originally felt lumpiness in her left breast. This prompted a mammogram.Her mammogram was performed on 08/25/2012 showed pleomorphic calcifications of the entire upper outer left breast measuring 11 x 12 x 15 cm. On exam she had a for a multinodular mass like area over the left upper quadrant. Ultrasound confirmed multiple ill-defined hypoechoic areas. Ultrasound of the left axilla showed a few abnormal appearing lymph nodes. Biopsy of the lymph nodes and the breast was performed on 09/01/2012. 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 The ER was 2% PR 0%.   #2Patient was recommended A mastectomy. 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 Lymphovascular invasion was identified all resection margins were negative the 2 sentinel nodes were removed one was positive for metastatic disease. Patient then went on to have a full axillary lymph node dissection performed the pathology showed 10 lymph nodes negative for metastatic disease therefore 1 of 12 lymph nodes were positive. Final pathologic staging was T T3 N1. Tumor was ER negative PR negative HER-2/neu negative.  #3 patient did have genetic counseling performed but she declined genetic testing at that time. We discussed genetic counseling and testing again in patient is now agreeable and we will get this taken care of.  #4 patient was seen by  Dr. Pierce Crane on 11/17/2012 he recommended PET scan which was negative for metastatic disease. He also recommended adjuvant chemotherapy consisting of FEC given every 2 weeks for a total of 4 cycles. She has now completed the [facility] as of end of March 2014.  #5 on 03/17/2013 patient will begin adjuvant Taxol carboplatinum weekly for a total of 12 weeks.  #5 patient with left lower extremity DVT she is on Coumadin  CURRENT THERAPY:  Taxol carbo week 8  INTERVAL HISTORY: Mahi Zabriskie 47 y.o. female returns for followup visit today prior to adjuvant chemotherapy for her left breast cancer.  She contininues to take Coumadin and is followed by the coumadin clinic.  She continues to have stable and mild numbness in the tips of her thumbs bilaterally and her great toes.  She has no motor deficit in her thumbs and great toes.  She continues to have an ingrown toenail, but otherwise denies fevers, chills, nausea, vomiting, constipation, diarrhea, skin changes or any other concerns.  She was seen by podiatry for her ingrown toenail and they recommended removal. Though bothersome, it is not particularly painful, or draining any fluid, so she would like to wait until after chemotherapy.   MEDICAL HISTORY: Past Medical History  Diagnosis Date  . Coughing     cold recent   . Breast cancer     left, Sept 20 2013    ALLERGIES:  has No Known Allergies.  MEDICATIONS:  Current Outpatient Prescriptions  Medication Sig Dispense Refill  . clindamycin (CLEOCIN-T) 1 % lotion Apply topically 2 (two) times daily.  60 mL  0  . dexamethasone (DECADRON) 4 MG tablet       .  diphenhydrAMINE (SOMINEX) 25 MG tablet Take 50 mg by mouth 2 (two) times daily as needed. For itching      . lidocaine-prilocaine (EMLA) cream Apply topically as needed.  30 g  6  . LORazepam (ATIVAN) 0.5 MG tablet Take 1 tablet (0.5 mg total) by mouth every 6 (six) hours as needed (for nausea or vomitting).  30 tablet  0  . ondansetron  (ZOFRAN) 8 MG tablet       . prochlorperazine (COMPAZINE) 10 MG tablet Take 1 tablet (10 mg total) by mouth every 6 (six) hours as needed.  30 tablet  1  . prochlorperazine (COMPAZINE) 25 MG suppository       . warfarin (COUMADIN) 5 MG tablet Take 1-2 tablets by mouth daily as directed.  60 tablet  1   No current facility-administered medications for this visit.    SURGICAL HISTORY:  Past Surgical History  Procedure Laterality Date  . Simple mastectomy w/ sentinel node biopsy  11/03/2012  . Simple mastectomy with axillary sentinel node biopsy  11/03/2012    Procedure: SIMPLE MASTECTOMY WITH AXILLARY SENTINEL NODE BIOPSY;  Surgeon: Kandis Cocking, MD;  Location: [facility] OR;  Service: General;  Laterality: Left;  . Portacath placement  12/08/2012    Procedure: INSERTION PORT-A-CATH;  Surgeon: Kandis Cocking, MD;  Location: [facility] ORS;  Service: General;  Laterality: N/A;  Power Port Placment and Left Axillary Node Dissection  . Axillary lymph node dissection  12/08/2012    Procedure: AXILLARY LYMPH NODE DISSECTION;  Surgeon: Kandis Cocking, MD;  Location: [facility] OR[facility];  Service: General;  Laterality: Left;    REVIEW OF SYSTEMS:  General: fatigue (-), night sweats (-), fever (-), pain (-) Lymph: palpable nodes (-) HEENT: vision changes (-), mucositis (-), gum bleeding (-), epistaxis (-) Cardiovascular: chest pain (-), palpitations (-) Pulmonary: shortness of breath (-), dyspnea on exertion (-), cough (-), hemoptysis (-) GI:  Early satiety (-), melena (-), dysphagia (-), nausea/vomiting (-), diarrhea (-) GU: dysuria (-), hematuria (-), incontinence (-) Musculoskeletal: joint swelling (-), joint pain (-), back pain (-) Neuro: weakness (-), numbness (-), headache (-), confusion (-) Skin: Rash (-), lesions (-), dryness (-) Psych: depression (-), suicidal/homicidal ideation (-), feeling of hopelessness (-)  PHYSICAL EXAMINATION: Blood pressure 138/83, pulse 94, temperature 98.4 F (36.9 C), temperature  source Oral, resp. rate 20, height 5\' 4"  (1.626 m), weight 206 lb 1.6 oz (93.486 kg). Body mass index is 35.36 kg/(m^2). General: Patient is a well appearing female in no acute distress HEENT: PERRLA, sclerae anicteric no conjunctival pallor, MMM Neck: supple, no palpable adenopathy Lungs: clear to auscultation bilaterally, no wheezes, rhonchi, or rales Cardiovascular: regular rate rhythm, S1, S2, no murmurs, rubs or gallops,  Abdomen: Soft, non-tender, non-distended, normoactive bowel sounds, no HSM Extremities: warm and well perfused, no clubbing, cyanosis, or edema Skin: No rashes or lesions  Neuro: Non-focal Breasts: left mastectomy site without nodularity, well healed, right breast no masses/nodularity ECOG PERFORMANCE STATUS: 0 - Asymptomatic   LABORATORY DATA: Lab Results  Component Value Date   WBC 3.6* 05/04/2013   HGB 9.0* 05/04/2013   HCT 27.9* 05/04/2013   MCV 93.6 05/04/2013   PLT 140* 05/04/2013      Chemistry      Component Value Date/Time   NA 141 04/28/2013 0927   NA 136 11/04/2012 0637   K 3.8 04/28/2013 0927   K 3.5 11/04/2012 0637   CL 111* 04/28/2013 0927   CL 103 11/04/2012 0637   CO2  18* 04/28/2013 0927   CO2 22 11/04/2012 0637   BUN 7.5 04/28/2013 0927   BUN 6 11/04/2012 0637   CREATININE 0.6 04/28/2013 0927   CREATININE 0.59 11/04/2012 0637      Component Value Date/Time   CALCIUM 8.0* 04/28/2013 0927   CALCIUM 8.4 11/04/2012 0637   ALKPHOS 109 04/28/2013 0927   AST 24 04/28/2013 0927   ALT 27 04/28/2013 0927   BILITOT <0.20 Repeated and Verified 04/28/2013 4098       RADIOGRAPHIC STUDIES:  Dg Chest Port 1 View  12/08/2012  *RADIOLOGY REPORT*  Clinical Data: Post line placement.  Rule out pneumothorax.  The  PORTABLE CHEST - 1 VIEW  Comparison: 11/25/2012 PET CT.  10/26/2012 chest x-ray.  Findings: Curvilinear appearance right apex along the undersurface of the posterior aspect of the right second rib probably related to rib rather than pneumothorax.    This can be assessed on follow-up examination if there are any progressive symptoms to suggest pneumothorax.  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  Cardiomegaly.  Mild central pulmonary vascular prominence.  Prior left breast surgery and left axillary lymph node dissection.  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  No definitive pneumothorax.  Curvilinear structure right lung apex probably associated with the undersurface of rib as noted above.  This is a call report.   Original Report Authenticated By: Lacy Duverney, M.D.    Dg C-arm 1-60 Min-no Report  12/08/2012  CLINICAL DATA: port-a-cath insertion   C-ARM 1-60 MINUTES  Fluoroscopy was utilized by the requesting physician.  No radiographic  interpretation.      ASSESSMENT: 47 year old female with   #1stage III a( T3 N1) invasive ductal carcinoma she is status post mastectomy with axillary lymph node dissection with one of 12 lymph nodes positive for metastatic disease. Tumor was ER negative PR negative HER-2/neu negative. She had her mastectomy on 11/03/2012 with axillary lymph node dissection performed on 12/08/2012 and she had a Port-A-Cath placed at that time as well. She should have no evidence of metastatic disease elsewhere distally.  #2 patient has completed 4 cycles of FEC that was given dose dense. She reports see this from 01/10/2013 through March 2014.  #3 she will begin Taxol carboplatinum adjuvantly on a weekly basis starting on 03/17/2013 for a total of 12 weeks.  #4 patient now with DVTs she is on Coumadin and Lovenox. She is being followed by the Coumadin clinic.  #5 Ingrown toenail-patient was recommended toenail removal.  We will wait until after her chemotherapy is complete if possible.    PLAN:  #1 Doing well.  Proceed with chemotherapy. Her counts are starting to decline.  Her ANC is 1.9 and platelets 140.  I discussed these labs with her today, and the possiblity of needing neupogen if  the WBC continues to decline in the future, and the possibility of having to hold chemotherapy should the platelets be less than 100 in the future.    #2 She will continue to take her coumadin under the care of our Coumadin clinic. I have contacted them to alert them to the possibility of toenail removal in the future.        #3 We will see Ms. Bar back for chemotherapy next week.   #4 She will f/u with podiatry after chemotherapy for toenail removal.     All questions were answered. The patient knows to call the clinic with any problems, questions or concerns. We can certainly see  the patient much sooner if necessary.  I spent 25 minutes counseling the patient face to face. The total time spent in the appointment was 30 minutes.  Cherie Ouch Lyn Hollingshead, NP Medical Oncology [facility] Phone: [phone]

## 2013-05-04 NOTE — Patient Instructions (Addendum)
[facility] Discharge Instructions for Patients Receiving Chemotherapy  Today you received the following chemotherapy agents taxol and Carboplatin.  To help prevent nausea and vomiting after your treatment, we encourage you to take your nausea medication as prescribed.    If you develop nausea and vomiting that is not controlled by your nausea medication, call the clinic. If it is after clinic hours your family physician or the after hours number for the clinic or go to the Emergency Department.   BELOW ARE SYMPTOMS THAT SHOULD BE REPORTED IMMEDIATELY:  *FEVER GREATER THAN 100.5 F  *CHILLS WITH OR WITHOUT FEVER  NAUSEA AND VOMITING THAT IS NOT CONTROLLED WITH YOUR NAUSEA MEDICATION  *UNUSUAL SHORTNESS OF BREATH  *UNUSUAL BRUISING OR BLEEDING  TENDERNESS IN MOUTH AND THROAT WITH OR WITHOUT PRESENCE OF ULCERS  *URINARY PROBLEMS  *BOWEL PROBLEMS  UNUSUAL RASH Items with * indicate a potential emergency and should be followed up as soon as possible.  Please let the nurse know about any problems that you may have experienced. Feel free to call the clinic you have any questions or concerns. The clinic phone number is [phone].   I have been informed and understand all the instructions given to me. I know to contact the clinic, my physician, or go to the Emergency Department if any problems should occur. I do not have any questions at this time, but understand that I may call the clinic during office hours   should I have any questions or need assistance in obtaining follow up care.    __________________________________________  _____________  __________ Signature of Patient or Authorized Representative            Date                   Time    __________________________________________ Nurse's Signature

## 2013-05-04 NOTE — Patient Instructions (Addendum)
Doing well proceed with chemo

## 2013-05-05 ENCOUNTER — Ambulatory Visit: Payer: 59 | Admitting: Adult Health

## 2013-05-05 ENCOUNTER — Other Ambulatory Visit: Payer: 59 | Admitting: Lab

## 2013-05-12 ENCOUNTER — Telehealth: Payer: Self-pay | Admitting: Oncology

## 2013-05-12 ENCOUNTER — Ambulatory Visit (HOSPITAL_BASED_OUTPATIENT_CLINIC_OR_DEPARTMENT_OTHER): Payer: 59

## 2013-05-12 ENCOUNTER — Ambulatory Visit (HOSPITAL_BASED_OUTPATIENT_CLINIC_OR_DEPARTMENT_OTHER): Payer: 59 | Admitting: Adult Health

## 2013-05-12 ENCOUNTER — Ambulatory Visit: Payer: 59 | Admitting: Pharmacist

## 2013-05-12 ENCOUNTER — Encounter: Payer: Self-pay | Admitting: Adult Health

## 2013-05-12 ENCOUNTER — Other Ambulatory Visit (HOSPITAL_BASED_OUTPATIENT_CLINIC_OR_DEPARTMENT_OTHER): Payer: 59 | Admitting: Lab

## 2013-05-12 VITALS — BP 155/89 | HR 105 | Temp 97.7°F | Resp 20 | Ht 64.0 in | Wt 207.7 lb

## 2013-05-12 DIAGNOSIS — I82409 Acute embolism and thrombosis of unspecified deep veins of unspecified lower extremity: Secondary | ICD-10-CM

## 2013-05-12 DIAGNOSIS — C50412 Malignant neoplasm of upper-outer quadrant of left female breast: Secondary | ICD-10-CM

## 2013-05-12 DIAGNOSIS — D509 Iron deficiency anemia, unspecified: Secondary | ICD-10-CM

## 2013-05-12 DIAGNOSIS — I82402 Acute embolism and thrombosis of unspecified deep veins of left lower extremity: Secondary | ICD-10-CM

## 2013-05-12 DIAGNOSIS — G629 Polyneuropathy, unspecified: Secondary | ICD-10-CM

## 2013-05-12 DIAGNOSIS — C50419 Malignant neoplasm of upper-outer quadrant of unspecified female breast: Secondary | ICD-10-CM

## 2013-05-12 DIAGNOSIS — Z5111 Encounter for antineoplastic chemotherapy: Secondary | ICD-10-CM

## 2013-05-12 DIAGNOSIS — C773 Secondary and unspecified malignant neoplasm of axilla and upper limb lymph nodes: Secondary | ICD-10-CM

## 2013-05-12 LAB — CBC WITH DIFFERENTIAL/PLATELET
BASO%: 0.6 % (ref 0.0–2.0)
EOS%: 0.6 % (ref 0.0–7.0)
Eosinophils Absolute: 0 10*3/uL (ref 0.0–0.5)
MCH: 30.5 pg (ref 25.1–34.0)
MCV: 95 fL (ref 79.5–101.0)
MONO%: 10.5 % (ref 0.0–14.0)
NEUT#: 1.8 10*3/uL (ref 1.5–6.5)
RBC: 3.02 10*6/uL — ABNORMAL LOW (ref 3.70–5.45)
RDW: 21.1 % — ABNORMAL HIGH (ref 11.2–14.5)

## 2013-05-12 LAB — COMPREHENSIVE METABOLIC PANEL (CC13)
ALT: 36 U/L (ref 0–55)
AST: 34 U/L (ref 5–34)
Albumin: 3.5 g/dL (ref 3.5–5.0)
Alkaline Phosphatase: 129 U/L (ref 40–150)
BUN: 7.3 mg/dL (ref 7.0–26.0)
CO2: 25 meq/L (ref 22–29)
Calcium: 8.7 mg/dL (ref 8.4–10.4)
Chloride: 106 meq/L (ref 98–107)
Creatinine: 0.6 mg/dL (ref 0.6–1.1)
Glucose: 175 mg/dL — ABNORMAL HIGH (ref 70–99)
Potassium: 3.9 meq/L (ref 3.5–5.1)
Sodium: 141 meq/L (ref 136–145)
Total Bilirubin: 0.22 mg/dL (ref 0.20–1.20)
Total Protein: 7 g/dL (ref 6.4–8.3)

## 2013-05-12 LAB — PROTIME-INR
INR: 4.3 — ABNORMAL HIGH (ref 2.00–3.50)
Protime: 51.6 s — ABNORMAL HIGH (ref 10.6–13.4)

## 2013-05-12 MED ORDER — HEPARIN SOD (PORK) LOCK FLUSH 100 UNIT/ML IV SOLN
500.0000 [IU] | Freq: Once | INTRAVENOUS | Status: AC | PRN
  Administered 2013-05-12: 500 [IU]
  Filled 2013-05-12: qty 5

## 2013-05-12 MED ORDER — DIPHENHYDRAMINE HCL 50 MG/ML IJ SOLN
50.0000 mg | Freq: Once | INTRAMUSCULAR | Status: AC
  Administered 2013-05-12: 50 mg via INTRAVENOUS

## 2013-05-12 MED ORDER — SUPER B COMPLEX/C PO CAPS: 1.0000 | ORAL_CAPSULE | Freq: Every day | ORAL | Status: DC

## 2013-05-12 MED ORDER — FAMOTIDINE IN NACL 20-0.9 MG/50ML-% IV SOLN
20.0000 mg | Freq: Once | INTRAVENOUS | Status: AC
  Administered 2013-05-12: 20 mg via INTRAVENOUS

## 2013-05-12 MED ORDER — SODIUM CHLORIDE 0.9 % IV SOLN
80.0000 mg/m2 | Freq: Once | INTRAVENOUS | Status: AC
  Administered 2013-05-12: 168 mg via INTRAVENOUS
  Filled 2013-05-12: qty 28

## 2013-05-12 MED ORDER — SODIUM CHLORIDE 0.9 % IJ SOLN
10.0000 mL | INTRAMUSCULAR | Status: DC | PRN
  Administered 2013-05-12: 10 mL
  Filled 2013-05-12: qty 10

## 2013-05-12 MED ORDER — ONDANSETRON 16 MG/50ML IVPB (CHCC)
16.0000 mg | Freq: Once | INTRAVENOUS | Status: AC
  Administered 2013-05-12: 16 mg via INTRAVENOUS

## 2013-05-12 MED ORDER — SODIUM CHLORIDE 0.9 % IV SOLN
300.0000 mg | Freq: Once | INTRAVENOUS | Status: AC
  Administered 2013-05-12: 300 mg via INTRAVENOUS
  Filled 2013-05-12: qty 30

## 2013-05-12 MED ORDER — DEXAMETHASONE SODIUM PHOSPHATE 20 MG/5ML IJ SOLN
20.0000 mg | Freq: Once | INTRAMUSCULAR | Status: AC
  Administered 2013-05-12: 20 mg via INTRAVENOUS

## 2013-05-12 MED ORDER — SODIUM CHLORIDE 0.9 % IV SOLN
Freq: Once | INTRAVENOUS | Status: AC
  Administered 2013-05-12: 12:00:00 via INTRAVENOUS

## 2013-05-12 NOTE — Patient Instructions (Signed)
Take B complex or super b complex daily for the numbness.  Should it worsen we may have to start Gabapentin/Neurontin to prevent the numbness from progressing.  Additionally, you will receive Neupogen daily x 4 days to keep your white blood cells up and prevent delays in treatment.  Please take Claritin daily for the next week while receiving these injections.  Please call us if you have any questions or concerns.    Gabapentin capsules or tablets What is this medicine? GABAPENTIN (GA ba pen tin) is used to control partial seizures in adults with epilepsy. It is also used to treat certain types of nerve pain. This medicine may be used for other purposes; ask your health care provider or pharmacist if you have questions. What should I tell my health care provider before I take this medicine? They need to know if you have any of these conditions: -kidney disease -suicidal thoughts, plans, or attempt; a previous suicide attempt by you or a family member -an unusual or allergic reaction to gabapentin, other medicines, foods, dyes, or preservatives -pregnant or trying to get pregnant -breast-feeding How should I use this medicine? Take this medicine by mouth. Swallow it with a drink of water. Follow the directions on the prescription label. If this medicine upsets your stomach, take it with food or milk. Take your medicine at regular intervals. Do not take it more often than directed. If you are directed to break the 600 or 800 mg tablets in half as part of your dose, the extra half tablet should be used for the next dose. If you have not used the extra half tablet within 3 days, it should be thrown away. A special MedGuide will be given to you by the pharmacist with each prescription and refill. Be sure to read this information carefully each time. Talk to your pediatrician regarding the use of this medicine in children. Special care may be needed. Overdosage: If you think you have taken too much of  this medicine contact a poison control center or emergency room at once. NOTE: This medicine is only for you. Do not share this medicine with others. What if I miss a dose? If you miss a dose, take it as soon as you can. If it is almost time for your next dose, take only that dose. Do not take double or extra doses. What may interact with this medicine? -antacids -hydrocodone -morphine -naproxen -sevelamer This list may not describe all possible interactions. Give your health care provider a list of all the medicines, herbs, non-prescription drugs, or dietary supplements you use. Also tell them if you smoke, drink alcohol, or use illegal drugs. Some items may interact with your medicine. What should I watch for while using this medicine? Visit your doctor or health care professional for regular checks on your progress. You may want to keep a record at home of how you feel your condition is responding to treatment. You may want to share this information with your doctor or health care professional at each visit. You should contact your doctor or health care professional if your seizures get worse or if you have any new types of seizures. Do not stop taking this medicine or any of your seizure medicines unless instructed by your doctor or health care professional. Stopping your medicine suddenly can increase your seizures or their severity. Wear a medical identification bracelet or chain if you are taking this medicine for seizures, and carry a card that lists all your medications. You  may get drowsy, dizzy, or have blurred vision. Do not drive, use machinery, or do anything that needs mental alertness until you know how this medicine affects you. To reduce dizzy or fainting spells, do not sit or stand up quickly, especially if you are an older patient. Alcohol can increase drowsiness and dizziness. Avoid alcoholic drinks. Your mouth may get dry. Chewing sugarless gum or sucking hard candy, and drinking  plenty of water will help. The use of this medicine may increase the chance of suicidal thoughts or actions. Pay special attention to how you are responding while on this medicine. Any worsening of mood, or thoughts of suicide or dying should be reported to your health care professional right away. Women who become pregnant while using this medicine may enroll in the [place] Antiepileptic Drug Pregnancy Registry by calling [id number]. This registry collects information about the safety of antiepileptic drug use during pregnancy. What side effects may I notice from receiving this medicine? Side effects that you should report to your doctor or health care professional as soon as possible: -allergic reactions like skin rash, itching or hives, swelling of the face, lips, or tongue -worsening of mood, thoughts or actions of suicide or dying Side effects that usually do not require medical attention (report to your doctor or health care professional if they continue or are bothersome): -constipation -difficulty walking or controlling muscle movements -nausea -slurred speech -tremors -weight gain This list may not describe all possible side effects. Call your doctor for medical advice about side effects. You may report side effects to FDA at 1-800-FDA-1088. Where should I keep my medicine? Keep out of reach of children. Store at room temperature between 15 and 30 degrees C (59 and 86 degrees F). Throw away any unused medicine after the expiration date. NOTE: This sheet is a summary. It may not cover all possible information. If you have questions about this medicine, talk to your doctor, pharmacist, or health care provider.  2012, [employer]vier/Gold Standard. (07/29/2010 6:06:26 PM)

## 2013-05-12 NOTE — Patient Instructions (Signed)
Patient aware of next appointment; discharge home with no complaints.

## 2013-05-12 NOTE — Patient Instructions (Addendum)
INR above goal today  Hold coumadin today and tomorrow (Friday and Saturday)  On Sunday restart coumadin and decrease dose to 5 mg (1 tablet) daily  We will see you in the infusion area on Thursday 05/18/13 around 1:30pm

## 2013-05-12 NOTE — Progress Notes (Signed)
INR has increased to 4.3 today. Per patient no changes have occurred to medications or diet. No extra doses or missed doses and no bruising or bleeding noted. Patient could be more sensitive to her current dose. Plan is to hold coumadin x 2 days and then decrease dose to 5 mg daily starting on Sunday. Patient will be seen next week with other scheduled appointments on 6.5.14 and pharmacy will see patient in infusion area around 1:30pm

## 2013-05-12 NOTE — Progress Notes (Signed)
OFFICE PROGRESS NOTE  CC Dr. Ovidio Kin Dr. 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 Pierce Crane Dr. Ovidio Kin and Dr. Lurline Hare.Patient originally felt lumpiness in her left breast. This prompted a mammogram.Her mammogram was performed on 08/25/2012 showed pleomorphic calcifications of the entire upper outer left breast measuring 11 x 12 x 15 cm. On exam she had a for a multinodular mass like area over the left upper quadrant. Ultrasound confirmed multiple ill-defined hypoechoic areas. Ultrasound of the left axilla showed a few abnormal appearing lymph nodes. Biopsy of the lymph nodes and the breast was performed on 09/01/2012. 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 The ER was 2% PR 0%.   #2Patient was recommended A mastectomy. 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 Lymphovascular invasion was identified all resection margins were negative the 2 sentinel nodes were removed one was positive for metastatic disease. Patient then went on to have a full axillary lymph node dissection performed the pathology showed 10 lymph nodes negative for metastatic disease therefore 1 of 12 lymph nodes were positive. Final pathologic staging was T T3 N1. Tumor was ER negative PR negative HER-2/neu negative.  #3 patient did have genetic counseling performed but she declined genetic testing at that time. We discussed genetic counseling and testing again in patient is now agreeable and we will get this taken care of.  #4 patient was seen by  Dr. Pierce Crane on 11/17/2012 he recommended PET scan which was negative for metastatic disease. He also recommended adjuvant chemotherapy consisting of FEC given every 2 weeks for a total of 4 cycles. She has now completed the [facility] as of end of March 2014.  She also had iron deficiency and received Feraheme in December.    #5 on 03/17/2013 patient will begin adjuvant Taxol carboplatinum weekly for a total of 12 weeks.  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 female returns for followup visit today prior to adjuvant chemotherapy for her left breast cancer.  She contininues to take Coumadin and is followed by the coumadin clinic.  She continues to have mildly progressive numbness in the tips of her thumbs bilaterally and her great toes.  She has no motor deficit in her thumbs and great toes.  She continues to have an ingrown toenail that she is waiting to have removed, but otherwise denies fevers, chills, nausea, vomiting, constipation, diarrhea, skin changes or any other concerns.    MEDICAL HISTORY: Past Medical History  Diagnosis Date  . Coughing     cold recent   . Breast cancer     left, Sept 20 2013    ALLERGIES:  has No Known Allergies.  MEDICATIONS:  Current Outpatient Prescriptions  Medication Sig Dispense Refill  . clindamycin (CLEOCIN-T) 1 % lotion Apply topically 2 (two) times daily.  60 mL  0  . dexamethasone (DECADRON) 4 MG tablet       . diphenhydrAMINE (SOMINEX) 25 MG tablet Take 50 mg  by mouth 2 (two) times daily as needed. For itching      . lidocaine-prilocaine (EMLA) cream Apply topically as needed.  30 g  6  . LORazepam (ATIVAN) 0.5 MG tablet Take 1 tablet (0.5 mg total) by mouth every 6 (six) hours as needed (for nausea or vomitting).  30 tablet  0  . ondansetron (ZOFRAN) 8 MG tablet       . prochlorperazine (COMPAZINE) 10 MG tablet Take 1 tablet (10 mg total) by  mouth every 6 (six) hours as needed.  30 tablet  1  . prochlorperazine (COMPAZINE) 25 MG suppository       . warfarin (COUMADIN) 5 MG tablet Take 1-2 tablets by mouth daily as directed.  60 tablet  1  . SUPER B COMPLEX/C CAPS Take 1 tablet by mouth daily.  30 capsule  2   No current facility-administered medications for this visit.    SURGICAL HISTORY:  Past Surgical History  Procedure Laterality Date  . Simple mastectomy w/ sentinel node biopsy  11/03/2012  . Simple mastectomy with axillary sentinel node biopsy  11/03/2012    Procedure: SIMPLE MASTECTOMY WITH AXILLARY SENTINEL NODE BIOPSY;  Surgeon: Kandis Cocking, MD;  Location: [facility] OR;  Service: General;  Laterality: Left;  . Portacath placement  12/08/2012    Procedure: INSERTION PORT-A-CATH;  Surgeon: Kandis Cocking, MD;  Location: [facility] ORS;  Service: General;  Laterality: N/A;  Power Port Placment and Left Axillary Node Dissection  . Axillary lymph node dissection  12/08/2012    Procedure: AXILLARY LYMPH NODE DISSECTION;  Surgeon: Kandis Cocking, MD;  Location: [facility] ORS;  Service: General;  Laterality: Left;    REVIEW OF SYSTEMS:  General: fatigue (-), night sweats (-), fever (-), pain (-) Lymph: palpable nodes (-) HEENT: vision changes (-), mucositis (-), gum bleeding (-), epistaxis (-) Cardiovascular: chest pain (-), palpitations (-) Pulmonary: shortness of breath (-), dyspnea on exertion (-), cough (-), hemoptysis (-) GI:  Early satiety (-), melena (-), dysphagia (-), nausea/vomiting (-), diarrhea (-) GU: dysuria (-), hematuria (-), incontinence (-) Musculoskeletal: joint swelling (-), joint pain (-), back pain (-) Neuro: weakness (-), numbness (-), headache (-), confusion (-) Skin: Rash (-), lesions (-), dryness (-) Psych: depression (-), suicidal/homicidal ideation (-), feeling of hopelessness (-)  PHYSICAL EXAMINATION: Blood pressure 155/89, pulse 105, temperature 97.7 F (36.5 C), temperature source Oral, resp. rate 20,  height 5\' 4"  (1.626 m), weight 207 lb 11.2 oz (94.212 kg). Body mass index is 35.63 kg/(m^2). General: Patient is a well appearing female in no acute distress HEENT: PERRLA, sclerae anicteric no conjunctival pallor, MMM Neck: supple, no palpable adenopathy Lungs: clear to auscultation bilaterally, no wheezes, rhonchi, or rales Cardiovascular: regular rate rhythm, S1, S2, no murmurs, rubs or gallops,  Abdomen: Soft, non-tender, non-distended, normoactive bowel sounds, no HSM Extremities: warm and well perfused, no clubbing, cyanosis, or edema Skin: No rashes or lesions  Neuro: Non-focal Breasts: left mastectomy site without nodularity, well healed, right breast no masses/nodularity ECOG PERFORMANCE STATUS: 0 - Asymptomatic   LABORATORY DATA: Lab Results  Component Value Date   WBC 3.4* 05/12/2013   HGB 9.2* 05/12/2013   HCT 28.7* 05/12/2013   MCV 95.0 05/12/2013   PLT 171 05/12/2013      Chemistry      Component Value Date/Time   NA 141 05/12/2013 1030   NA 136 11/04/2012 0637   K 3.9 05/12/2013 1030   K 3.5 11/04/2012 0637   CL 106 05/12/2013 1030  CL 103 11/04/2012 0637   CO2 25 05/12/2013 1030   CO2 22 11/04/2012 0637   BUN 7.3 05/12/2013 1030   BUN 6 11/04/2012 0637   CREATININE 0.6 05/12/2013 1030   CREATININE 0.59 11/04/2012 0637      Component Value Date/Time   CALCIUM 8.7 05/12/2013 1030   CALCIUM 8.4 11/04/2012 0637   ALKPHOS 129 05/12/2013 1030   AST 34 05/12/2013 1030   ALT 36 05/12/2013 1030   BILITOT 0.22 05/12/2013 1030       RADIOGRAPHIC STUDIES:  Dg Chest Port 1 View  12/08/2012  *RADIOLOGY REPORT*  Clinical Data: Post line placement.  Rule out pneumothorax.  The  PORTABLE CHEST - 1 VIEW  Comparison: 11/25/2012 PET CT.  10/26/2012 chest x-ray.  Findings: Curvilinear appearance right apex along the undersurface of the posterior aspect of the right second rib probably related to rib rather than pneumothorax.   This can be assessed on follow-up examination if there  are any progressive symptoms to suggest pneumothorax.  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  Cardiomegaly.  Mild central pulmonary vascular prominence.  Prior left breast surgery and left axillary lymph node dissection.  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  No definitive pneumothorax.  Curvilinear structure right lung apex probably associated with the undersurface of rib as noted above.  This is a call report.   Original Report Authenticated By: Lacy Duverney, M.D.    Dg C-arm 1-60 Min-no Report  12/08/2012  CLINICAL DATA: port-a-cath insertion   C-ARM 1-60 MINUTES  Fluoroscopy was utilized by the requesting physician.  No radiographic  interpretation.      ASSESSMENT: 47 year old female with   #1stage III a( T3 N1) invasive ductal carcinoma she is status post mastectomy with axillary lymph node dissection with one of 12 lymph nodes positive for metastatic disease. Tumor was ER negative PR negative HER-2/neu negative. She had her mastectomy on 11/03/2012 with axillary lymph node dissection performed on 12/08/2012 and she had a Port-A-Cath placed at that time as well. She should have no evidence of metastatic disease elsewhere distally.  #2 patient has completed 4 cycles of FEC that was given dose dense. She reports see this from 01/10/2013 through March 2014.  #3 she will begin Taxol carboplatinum adjuvantly on a weekly basis starting on 03/17/2013 for a total of 12 weeks.  #4 patient now with DVTs she is on Coumadin and Lovenox. She is being followed by the Coumadin clinic.  #5 Ingrown toenail-patient was recommended toenail removal.  We will wait until after her chemotherapy is complete if possible.    #6 Iron deficiency, patient required parenteral iron in December, 2013.  We will recheck iron studies next week.    PLAN:  #1 Doing well.  Proceed with chemotherapy. Her WBC is continuing to decline.  In light of this we will give her neupogen daily x 4  days starting tomorrow.  We will recheck her iron studies next week.   #2 She will continue to take her coumadin under the care of our Coumadin clinic. I have contacted them to alert them to the possibility of toenail removal in the future.        #3 We will see Ms. Petruska back for chemotherapy next week.   #4 She will f/u with podiatry after chemotherapy for toenail removal.     All questions were answered. The patient knows to call the clinic with any problems, questions or concerns. We can certainly see  the patient much sooner if necessary.  I spent 25 minutes counseling the patient face to face. The total time spent in the appointment was 30 minutes.  Cherie Ouch Lyn Hollingshead, NP Medical Oncology [facility] Phone: [phone]

## 2013-05-13 ENCOUNTER — Ambulatory Visit: Payer: 59

## 2013-05-13 MED ORDER — FILGRASTIM 480 MCG/0.8ML IJ SOLN: 480.0000 ug | Freq: Once | INTRAMUSCULAR | Status: DC

## 2013-05-15 ENCOUNTER — Ambulatory Visit (HOSPITAL_BASED_OUTPATIENT_CLINIC_OR_DEPARTMENT_OTHER): Payer: 59

## 2013-05-15 VITALS — BP 152/83 | HR 111 | Temp 98.4°F

## 2013-05-15 DIAGNOSIS — C50419 Malignant neoplasm of upper-outer quadrant of unspecified female breast: Secondary | ICD-10-CM

## 2013-05-15 DIAGNOSIS — Z5189 Encounter for other specified aftercare: Secondary | ICD-10-CM

## 2013-05-15 DIAGNOSIS — C50412 Malignant neoplasm of upper-outer quadrant of left female breast: Secondary | ICD-10-CM

## 2013-05-15 MED ORDER — FILGRASTIM 480 MCG/0.8ML IJ SOLN
480.0000 ug | Freq: Once | INTRAMUSCULAR | Status: AC
  Administered 2013-05-15: 480 ug via SUBCUTANEOUS
  Filled 2013-05-15: qty 0.8

## 2013-05-16 ENCOUNTER — Ambulatory Visit (HOSPITAL_BASED_OUTPATIENT_CLINIC_OR_DEPARTMENT_OTHER): Payer: 59

## 2013-05-16 VITALS — BP 145/86 | HR 111 | Temp 98.2°F

## 2013-05-16 DIAGNOSIS — Z5189 Encounter for other specified aftercare: Secondary | ICD-10-CM

## 2013-05-16 DIAGNOSIS — C50412 Malignant neoplasm of upper-outer quadrant of left female breast: Secondary | ICD-10-CM

## 2013-05-16 DIAGNOSIS — C50419 Malignant neoplasm of upper-outer quadrant of unspecified female breast: Secondary | ICD-10-CM

## 2013-05-16 MED ORDER — FILGRASTIM 480 MCG/0.8ML IJ SOLN
480.0000 ug | Freq: Once | INTRAMUSCULAR | Status: AC
  Administered 2013-05-16: 480 ug via SUBCUTANEOUS
  Filled 2013-05-16: qty 0.8

## 2013-05-17 ENCOUNTER — Ambulatory Visit (HOSPITAL_BASED_OUTPATIENT_CLINIC_OR_DEPARTMENT_OTHER): Payer: 59

## 2013-05-17 VITALS — BP 142/97 | HR 110 | Temp 97.1°F | Resp 18

## 2013-05-17 DIAGNOSIS — Z5189 Encounter for other specified aftercare: Secondary | ICD-10-CM

## 2013-05-17 DIAGNOSIS — C50412 Malignant neoplasm of upper-outer quadrant of left female breast: Secondary | ICD-10-CM

## 2013-05-17 DIAGNOSIS — C50419 Malignant neoplasm of upper-outer quadrant of unspecified female breast: Secondary | ICD-10-CM

## 2013-05-17 MED ORDER — FILGRASTIM 480 MCG/0.8ML IJ SOLN
480.0000 ug | Freq: Once | INTRAMUSCULAR | Status: AC
  Administered 2013-05-17: 480 ug via SUBCUTANEOUS
  Filled 2013-05-17: qty 0.8

## 2013-05-17 NOTE — Patient Instructions (Signed)
Call MD for problems or conerns

## 2013-05-18 ENCOUNTER — Ambulatory Visit (HOSPITAL_BASED_OUTPATIENT_CLINIC_OR_DEPARTMENT_OTHER): Payer: 59 | Admitting: Pharmacist

## 2013-05-18 ENCOUNTER — Other Ambulatory Visit (HOSPITAL_BASED_OUTPATIENT_CLINIC_OR_DEPARTMENT_OTHER): Payer: 59 | Admitting: Lab

## 2013-05-18 ENCOUNTER — Ambulatory Visit (HOSPITAL_BASED_OUTPATIENT_CLINIC_OR_DEPARTMENT_OTHER): Payer: 59 | Admitting: Oncology

## 2013-05-18 ENCOUNTER — Ambulatory Visit (HOSPITAL_BASED_OUTPATIENT_CLINIC_OR_DEPARTMENT_OTHER): Payer: 59

## 2013-05-18 VITALS — BP 157/90 | HR 115 | Temp 98.0°F | Resp 20 | Ht 64.0 in | Wt 207.0 lb

## 2013-05-18 DIAGNOSIS — C773 Secondary and unspecified malignant neoplasm of axilla and upper limb lymph nodes: Secondary | ICD-10-CM

## 2013-05-18 DIAGNOSIS — I82409 Acute embolism and thrombosis of unspecified deep veins of unspecified lower extremity: Secondary | ICD-10-CM

## 2013-05-18 DIAGNOSIS — Z7901 Long term (current) use of anticoagulants: Secondary | ICD-10-CM

## 2013-05-18 DIAGNOSIS — C50412 Malignant neoplasm of upper-outer quadrant of left female breast: Secondary | ICD-10-CM

## 2013-05-18 DIAGNOSIS — C50419 Malignant neoplasm of upper-outer quadrant of unspecified female breast: Secondary | ICD-10-CM

## 2013-05-18 DIAGNOSIS — D509 Iron deficiency anemia, unspecified: Secondary | ICD-10-CM

## 2013-05-18 DIAGNOSIS — Z171 Estrogen receptor negative status [ER-]: Secondary | ICD-10-CM

## 2013-05-18 DIAGNOSIS — I82402 Acute embolism and thrombosis of unspecified deep veins of left lower extremity: Secondary | ICD-10-CM

## 2013-05-18 DIAGNOSIS — Z5111 Encounter for antineoplastic chemotherapy: Secondary | ICD-10-CM

## 2013-05-18 LAB — CBC WITH DIFFERENTIAL/PLATELET
Eosinophils Absolute: 0 10*3/uL (ref 0.0–0.5)
MONO#: 1.5 10*3/uL — ABNORMAL HIGH (ref 0.1–0.9)
NEUT#: 12.9 10*3/uL — ABNORMAL HIGH (ref 1.5–6.5)
RBC: 3.08 10*6/uL — ABNORMAL LOW (ref 3.70–5.45)
RDW: 21.1 % — ABNORMAL HIGH (ref 11.2–14.5)
WBC: 16.9 10*3/uL — ABNORMAL HIGH (ref 3.9–10.3)
lymph#: 2.3 10*3/uL (ref 0.9–3.3)
nRBC: 1 % — ABNORMAL HIGH (ref 0–0)

## 2013-05-18 LAB — COMPREHENSIVE METABOLIC PANEL (CC13)
Albumin: 3.5 g/dL (ref 3.5–5.0)
Alkaline Phosphatase: 173 U/L — ABNORMAL HIGH (ref 40–150)
BUN: 4.6 mg/dL — ABNORMAL LOW (ref 7.0–26.0)
Glucose: 180 mg/dl — ABNORMAL HIGH (ref 70–99)
Potassium: 3.3 mEq/L — ABNORMAL LOW (ref 3.5–5.1)

## 2013-05-18 LAB — IRON AND TIBC
%SAT: 11 % — ABNORMAL LOW (ref 20–55)
TIBC: 484 ug/dL — ABNORMAL HIGH (ref 250–470)

## 2013-05-18 LAB — PROTIME-INR: Protime: 18 Seconds — ABNORMAL HIGH (ref 10.6–13.4)

## 2013-05-18 MED ORDER — DEXAMETHASONE SODIUM PHOSPHATE 20 MG/5ML IJ SOLN
20.0000 mg | Freq: Once | INTRAMUSCULAR | Status: AC
  Administered 2013-05-18: 20 mg via INTRAVENOUS

## 2013-05-18 MED ORDER — DIPHENHYDRAMINE HCL 50 MG/ML IJ SOLN
50.0000 mg | Freq: Once | INTRAMUSCULAR | Status: AC
  Administered 2013-05-18: 50 mg via INTRAVENOUS

## 2013-05-18 MED ORDER — FAMOTIDINE IN NACL 20-0.9 MG/50ML-% IV SOLN
20.0000 mg | Freq: Once | INTRAVENOUS | Status: AC
  Administered 2013-05-18: 20 mg via INTRAVENOUS

## 2013-05-18 MED ORDER — HEPARIN SOD (PORK) LOCK FLUSH 100 UNIT/ML IV SOLN
500.0000 [IU] | Freq: Once | INTRAVENOUS | Status: AC | PRN
  Administered 2013-05-18: 500 [IU]
  Filled 2013-05-18: qty 5

## 2013-05-18 MED ORDER — PACLITAXEL CHEMO INJECTION 300 MG/50ML
80.0000 mg/m2 | Freq: Once | INTRAVENOUS | Status: AC
  Administered 2013-05-18: 168 mg via INTRAVENOUS
  Filled 2013-05-18: qty 28

## 2013-05-18 MED ORDER — SODIUM CHLORIDE 0.9 % IV SOLN
Freq: Once | INTRAVENOUS | Status: AC
  Administered 2013-05-18: 14:00:00 via INTRAVENOUS

## 2013-05-18 MED ORDER — SODIUM CHLORIDE 0.9 % IV SOLN
300.0000 mg | Freq: Once | INTRAVENOUS | Status: AC
  Administered 2013-05-18: 300 mg via INTRAVENOUS
  Filled 2013-05-18: qty 30

## 2013-05-18 MED ORDER — ONDANSETRON 16 MG/50ML IVPB (CHCC)
16.0000 mg | Freq: Once | INTRAVENOUS | Status: AC
  Administered 2013-05-18: 16 mg via INTRAVENOUS

## 2013-05-18 MED ORDER — SODIUM CHLORIDE 0.9 % IJ SOLN
10.0000 mL | INTRAMUSCULAR | Status: DC | PRN
  Administered 2013-05-18: 10 mL
  Filled 2013-05-18: qty 10

## 2013-05-18 MED ORDER — PACLITAXEL CHEMO INJECTION 300 MG/50ML: 80.0000 mg/m2 | Freq: Once | INTRAVENOUS | Status: DC

## 2013-05-18 NOTE — Progress Notes (Signed)
Pt seen in infusion area. INR below goal today.  Pt took coumadin as instructed at last visit. She began 5mg  daily on 05/14/13. No changes in diet, medications, etc. No problems to report. Will increase coumadin to 6mg  daily.  Recheck INR in 1 week with next scheduled treatment on 05/26/13; Lab at 8:15am, apt with Mardella Layman at 8:45am, Treatment at 9:30am and coumadin clinic at 9:45am.  Samples provided: Coumadin 1mg  tablets (x 10 tablets)                                 [id number], Exp: 2/15

## 2013-05-18 NOTE — Patient Instructions (Addendum)
[facility] Discharge Instructions for Patients Receiving Chemotherapy  Today you received the following chemotherapy agents :  Taxol, Carboplatin.  To help prevent nausea and vomiting after your treatment, we encourage you to take your nausea medication as instructed by your physician.   If you develop nausea and vomiting that is not controlled by your nausea medication, call the clinic.   BELOW ARE SYMPTOMS THAT SHOULD BE REPORTED IMMEDIATELY:  *FEVER GREATER THAN 100.5 F  *CHILLS WITH OR WITHOUT FEVER  NAUSEA AND VOMITING THAT IS NOT CONTROLLED WITH YOUR NAUSEA MEDICATION  *UNUSUAL SHORTNESS OF BREATH  *UNUSUAL BRUISING OR BLEEDING  TENDERNESS IN MOUTH AND THROAT WITH OR WITHOUT PRESENCE OF ULCERS  *URINARY PROBLEMS  *BOWEL PROBLEMS  UNUSUAL RASH Items with * indicate a potential emergency and should be followed up as soon as possible.  Feel free to call the clinic you have any questions or concerns. The clinic phone number is [phone].    

## 2013-05-18 NOTE — Patient Instructions (Signed)
Increase coumadin to 6mg  daily.  Recheck INR in 1 week with next scheduled treatment on 05/26/13; Lab at 8:15am, apt with Mardella Layman at 8:45am, Treatment at 9:30am and coumadin clinic at 9:45am.

## 2013-05-26 ENCOUNTER — Ambulatory Visit (HOSPITAL_BASED_OUTPATIENT_CLINIC_OR_DEPARTMENT_OTHER): Payer: 59 | Admitting: Pharmacist

## 2013-05-26 ENCOUNTER — Encounter: Payer: Self-pay | Admitting: Adult Health

## 2013-05-26 ENCOUNTER — Other Ambulatory Visit (HOSPITAL_BASED_OUTPATIENT_CLINIC_OR_DEPARTMENT_OTHER): Payer: 59 | Admitting: Lab

## 2013-05-26 ENCOUNTER — Ambulatory Visit (HOSPITAL_BASED_OUTPATIENT_CLINIC_OR_DEPARTMENT_OTHER): Payer: 59 | Admitting: Adult Health

## 2013-05-26 ENCOUNTER — Ambulatory Visit (HOSPITAL_BASED_OUTPATIENT_CLINIC_OR_DEPARTMENT_OTHER): Payer: 59

## 2013-05-26 VITALS — BP 133/83 | HR 102 | Temp 98.5°F | Resp 18 | Ht 64.0 in | Wt 205.2 lb

## 2013-05-26 DIAGNOSIS — C50412 Malignant neoplasm of upper-outer quadrant of left female breast: Secondary | ICD-10-CM

## 2013-05-26 DIAGNOSIS — Z5111 Encounter for antineoplastic chemotherapy: Secondary | ICD-10-CM

## 2013-05-26 DIAGNOSIS — C50419 Malignant neoplasm of upper-outer quadrant of unspecified female breast: Secondary | ICD-10-CM

## 2013-05-26 DIAGNOSIS — I82409 Acute embolism and thrombosis of unspecified deep veins of unspecified lower extremity: Secondary | ICD-10-CM

## 2013-05-26 DIAGNOSIS — C773 Secondary and unspecified malignant neoplasm of axilla and upper limb lymph nodes: Secondary | ICD-10-CM

## 2013-05-26 DIAGNOSIS — D509 Iron deficiency anemia, unspecified: Secondary | ICD-10-CM

## 2013-05-26 DIAGNOSIS — I82402 Acute embolism and thrombosis of unspecified deep veins of left lower extremity: Secondary | ICD-10-CM

## 2013-05-26 LAB — CBC WITH DIFFERENTIAL/PLATELET
Eosinophils Absolute: 0 10*3/uL (ref 0.0–0.5)
HGB: 8.6 g/dL — ABNORMAL LOW (ref 11.6–15.9)
MONO#: 0.4 10*3/uL (ref 0.1–0.9)
MONO%: 9.6 % (ref 0.0–14.0)
NEUT#: 2.1 10*3/uL (ref 1.5–6.5)
RBC: 2.85 10*6/uL — ABNORMAL LOW (ref 3.70–5.45)
RDW: 21.4 % — ABNORMAL HIGH (ref 11.2–14.5)
WBC: 3.9 10*3/uL (ref 3.9–10.3)
lymph#: 1.4 10*3/uL (ref 0.9–3.3)

## 2013-05-26 LAB — COMPREHENSIVE METABOLIC PANEL (CC13)
ALT: 31 U/L (ref 0–55)
AST: 33 U/L (ref 5–34)
BUN: 9.9 mg/dL (ref 7.0–26.0)
Chloride: 105 mEq/L (ref 98–107)
Creatinine: 0.7 mg/dL (ref 0.6–1.1)
Potassium: 3.5 mEq/L (ref 3.5–5.1)
Sodium: 140 mEq/L (ref 136–145)
Total Bilirubin: 0.21 mg/dL (ref 0.20–1.20)

## 2013-05-26 LAB — PROTIME-INR
INR: 1.7 — ABNORMAL LOW (ref 2.00–3.50)
Protime: 20.4 Seconds — ABNORMAL HIGH (ref 10.6–13.4)

## 2013-05-26 MED ORDER — DEXAMETHASONE SODIUM PHOSPHATE 20 MG/5ML IJ SOLN
20.0000 mg | Freq: Once | INTRAMUSCULAR | Status: AC
  Administered 2013-05-26: 20 mg via INTRAVENOUS

## 2013-05-26 MED ORDER — SODIUM CHLORIDE 0.9 % IV SOLN
Freq: Once | INTRAVENOUS | Status: AC
  Administered 2013-05-26: 10:00:00 via INTRAVENOUS

## 2013-05-26 MED ORDER — HEPARIN SOD (PORK) LOCK FLUSH 100 UNIT/ML IV SOLN
500.0000 [IU] | Freq: Once | INTRAVENOUS | Status: AC | PRN
  Administered 2013-05-26: 500 [IU]
  Filled 2013-05-26: qty 5

## 2013-05-26 MED ORDER — SODIUM CHLORIDE 0.9 % IV SOLN
300.0000 mg | Freq: Once | INTRAVENOUS | Status: AC
  Administered 2013-05-26: 300 mg via INTRAVENOUS
  Filled 2013-05-26: qty 30

## 2013-05-26 MED ORDER — ONDANSETRON 16 MG/50ML IVPB (CHCC)
16.0000 mg | Freq: Once | INTRAVENOUS | Status: AC
  Administered 2013-05-26: 16 mg via INTRAVENOUS

## 2013-05-26 MED ORDER — DIPHENHYDRAMINE HCL 50 MG/ML IJ SOLN
50.0000 mg | Freq: Once | INTRAMUSCULAR | Status: AC
  Administered 2013-05-26: 50 mg via INTRAVENOUS

## 2013-05-26 MED ORDER — FAMOTIDINE IN NACL 20-0.9 MG/50ML-% IV SOLN
20.0000 mg | Freq: Once | INTRAVENOUS | Status: AC
  Administered 2013-05-26: 20 mg via INTRAVENOUS

## 2013-05-26 MED ORDER — SODIUM CHLORIDE 0.9 % IV SOLN
80.0000 mg/m2 | Freq: Once | INTRAVENOUS | Status: AC
  Administered 2013-05-26: 168 mg via INTRAVENOUS
  Filled 2013-05-26: qty 28

## 2013-05-26 MED ORDER — SODIUM CHLORIDE 0.9 % IJ SOLN
10.0000 mL | INTRAMUSCULAR | Status: DC | PRN
  Administered 2013-05-26: 10 mL
  Filled 2013-05-26: qty 10

## 2013-05-26 NOTE — Progress Notes (Addendum)
INR continues to be below goal of 2-3 after taking coumadin 6mg  daily.  INR had previously been at goal at much lower coumadin doses.  There had probably been a DI with coumadin and 65fu (while receiving FEC)  that required a much lower coumadin dose.  Will continue to increase Hannah Hale's coumadin dose by about 20% to 7mg  daily.  Will check PT/INR in 1 week with next chemo tx.  Coumadin samples given 1mg  x 20 tabs [id number] exp2/2015

## 2013-05-26 NOTE — Patient Instructions (Addendum)
Doing well.  Proceed with chemotherapy.  Please call us if you have any questions or concerns.    

## 2013-05-26 NOTE — Progress Notes (Signed)
Discharged at 1:00 pm with spouse.  Ambulating well in no distress.

## 2013-05-26 NOTE — Patient Instructions (Signed)
[facility] Discharge Instructions for Patients Receiving Chemotherapy  Today you received the following chemotherapy agents Taxol, Carboplatin.  To help prevent nausea and vomiting after your treatment, we encourage you to take your nausea medication Ativan, compazine, zofran.  If you develop nausea and vomiting that is not controlled by your nausea medication, call the clinic.   BELOW ARE SYMPTOMS THAT SHOULD BE REPORTED IMMEDIATELY:  *FEVER GREATER THAN 100.5 F  *CHILLS WITH OR WITHOUT FEVER  NAUSEA AND VOMITING THAT IS NOT CONTROLLED WITH YOUR NAUSEA MEDICATION  *UNUSUAL SHORTNESS OF BREATH  *UNUSUAL BRUISING OR BLEEDING  TENDERNESS IN MOUTH AND THROAT WITH OR WITHOUT PRESENCE OF ULCERS  *URINARY PROBLEMS  *BOWEL PROBLEMS  UNUSUAL RASH Items with * indicate a potential emergency and should be followed up as soon as possible.  Feel free to call the clinic you have any questions or concerns. The clinic phone number is [phone].

## 2013-05-26 NOTE — Progress Notes (Signed)
OFFICE PROGRESS NOTE  CC Dr. Ovidio Kin Dr. 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 Pierce Crane Dr. Ovidio Kin and Dr. Lurline Hare.Patient originally felt lumpiness in her left breast. This prompted a mammogram.Her mammogram was performed on 08/25/2012 showed pleomorphic calcifications of the entire upper outer left breast measuring 11 x 12 x 15 cm. On exam she had a for a multinodular mass like area over the left upper quadrant. Ultrasound confirmed multiple ill-defined hypoechoic areas. Ultrasound of the left axilla showed a few abnormal appearing lymph nodes. Biopsy of the lymph nodes and the breast was performed on 09/01/2012. 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 The ER was 2% PR 0%.   #2Patient was recommended A mastectomy. 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 Lymphovascular invasion was identified all resection margins were negative the 2 sentinel nodes were removed one was positive for metastatic disease. Patient then went on to have a full axillary lymph node dissection performed the pathology showed 10 lymph nodes negative for metastatic disease therefore 1 of 12 lymph nodes were positive. Final pathologic staging was T T3 N1. Tumor was ER negative PR negative HER-2/neu negative.  #3 patient did have genetic counseling performed but she declined genetic testing at that time. We discussed genetic counseling and testing again in patient is now agreeable and we will get this taken care of.  #4 patient was seen by  Dr. Pierce Crane on 11/17/2012 he recommended PET scan which was negative for metastatic disease. He also recommended adjuvant chemotherapy consisting of FEC given every 2 weeks for a total of 4 cycles. She has now completed the [facility] as of end of March 2014.  She also had iron deficiency and received Feraheme in December.    #5 on 03/17/2013 patient will begin adjuvant Taxol carboplatinum weekly for a total of 12 weeks.  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 female returns for followup visit today prior to adjuvant chemotherapy for her left breast cancer.  She contininues to take Coumadin and is followed by the coumadin clinic.  She continues to have mild numbness in her fingertips and toes, and she is taking super b complex daily and it is stable.  She does have swelling in her left arm consistent with lymphedema. She is wearing her glove today.  She continues to have an ingrown toenail that she is waiting to have removed, but otherwise denies fevers, chills, nausea, vomiting, constipation, diarrhea, skin changes or any other concerns.    MEDICAL HISTORY: Past Medical History  Diagnosis Date  . Coughing     cold recent   . Breast cancer     left, Sept 20 2013    ALLERGIES:  has No Known Allergies.  MEDICATIONS:  Current Outpatient Prescriptions  Medication Sig Dispense Refill  . clindamycin (CLEOCIN-T) 1 % lotion Apply topically 2 (two) times daily.  60 mL  0  . dexamethasone (DECADRON) 4 MG tablet       .  diphenhydrAMINE (SOMINEX) 25 MG tablet Take 50 mg by mouth 2 (two) times daily as needed. For itching      . lidocaine-prilocaine (EMLA) cream Apply topically as needed.  30 g  6  . LORazepam (ATIVAN) 0.5 MG tablet Take 1 tablet (0.5 mg total) by mouth every 6 (six) hours as needed (for nausea or vomitting).  30 tablet  0  . ondansetron (ZOFRAN) 8 MG tablet       . prochlorperazine  (COMPAZINE) 10 MG tablet Take 1 tablet (10 mg total) by mouth every 6 (six) hours as needed.  30 tablet  1  . prochlorperazine (COMPAZINE) 25 MG suppository       . SUPER B COMPLEX/C CAPS Take 1 tablet by mouth daily.  30 capsule  2  . warfarin (COUMADIN) 5 MG tablet Take 1-2 tablets by mouth daily as directed.  60 tablet  1   No current facility-administered medications for this visit.    SURGICAL HISTORY:  Past Surgical History  Procedure Laterality Date  . Simple mastectomy w/ sentinel node biopsy  11/03/2012  . Simple mastectomy with axillary sentinel node biopsy  11/03/2012    Procedure: SIMPLE MASTECTOMY WITH AXILLARY SENTINEL NODE BIOPSY;  Surgeon: Kandis Cocking, MD;  Location: [facility] OR;  Service: General;  Laterality: Left;  . Portacath placement  12/08/2012    Procedure: INSERTION PORT-A-CATH;  Surgeon: Kandis Cocking, MD;  Location: [facility];  Service: General;  Laterality: N/A;  Power Port Placment and Left Axillary Node Dissection  . Axillary lymph node dissection  12/08/2012    Procedure: AXILLARY LYMPH NODE DISSECTION;  Surgeon: Kandis Cocking, MD;  Location: [facility];  Service: General;  Laterality: Left;    REVIEW OF SYSTEMS:  General: fatigue (+), night sweats (-), fever (-), pain (-) Lymph: palpable nodes (-) HEENT: vision changes (-), mucositis (-), gum bleeding (-), epistaxis (-) Cardiovascular: chest pain (-), palpitations (-) Pulmonary: shortness of breath (-), dyspnea on exertion (-), cough (-), hemoptysis (-) GI:  Early satiety (-), melena (-), dysphagia (-), nausea/vomiting (-), diarrhea (-) GU: dysuria (-), hematuria (-), incontinence (-) Musculoskeletal: joint swelling (-), joint pain (-), back pain (-) Neuro: weakness (-), numbness (+), headache (-), confusion (-) Skin: Rash (-), lesions (-), dryness (-) Psych: depression (-), suicidal/homicidal ideation (-), feeling of hopelessness (-)  PHYSICAL EXAMINATION: Blood pressure 133/83, pulse 102, temperature 98.5 F  (36.9 C), temperature source Oral, resp. rate 18, height 5\' 4"  (1.626 m), weight 205 lb 3.2 oz (93.078 kg). Body mass index is 35.21 kg/(m^2). General: Patient is a well appearing female in no acute distress HEENT: PERRLA, sclerae anicteric no conjunctival pallor, MMM Neck: supple, no palpable adenopathy Lungs: clear to auscultation bilaterally, no wheezes, rhonchi, or rales Cardiovascular: regular rate rhythm, S1, S2, no murmurs, rubs or gallops,  Abdomen: Soft, non-tender, non-distended, normoactive bowel sounds, no HSM Extremities: warm and well perfused, no clubbing, cyanosis, or edema, swelling in left upper extremity  Skin: No rashes or lesions  Neuro: Non-focal Breasts: left mastectomy site without nodularity, well healed, right breast no masses/nodularity ECOG PERFORMANCE STATUS: 0 - Asymptomatic   LABORATORY DATA: Lab Results  Component Value Date   WBC 3.9 05/26/2013   HGB 8.6* 05/26/2013   HCT 27.1* 05/26/2013   MCV 95.1 05/26/2013   PLT 183 05/26/2013      Chemistry      Component Value Date/Time   NA 140 05/18/2013 1158   NA 136 11/04/2012 0637   K 3.3*  05/18/2013 1158   K 3.5 11/04/2012 0637   CL 104 05/18/2013 1158   CL 103 11/04/2012 0637   CO2 25 05/18/2013 1158   CO2 22 11/04/2012 0637   BUN 4.6* 05/18/2013 1158   BUN 6 11/04/2012 0637   CREATININE 0.7 05/18/2013 1158   CREATININE 0.59 11/04/2012 0637      Component Value Date/Time   CALCIUM 9.2 05/18/2013 1158   CALCIUM 8.4 11/04/2012 0637   ALKPHOS 173* 05/18/2013 1158   AST 25 05/18/2013 1158   ALT 27 05/18/2013 1158   BILITOT 0.20 05/18/2013 1158       RADIOGRAPHIC STUDIES:  Dg Chest Port 1 View  12/08/2012  *RADIOLOGY REPORT*  Clinical Data: Post line placement.  Rule out pneumothorax.  The  PORTABLE CHEST - 1 VIEW  Comparison: 11/25/2012 PET CT.  10/26/2012 chest x-ray.  Findings: Curvilinear appearance right apex along the undersurface of the posterior aspect of the right second rib probably related to rib rather  than pneumothorax.   This can be assessed on follow-up examination if there are any progressive symptoms to suggest pneumothorax.  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  Cardiomegaly.  Mild central pulmonary vascular prominence.  Prior left breast surgery and left axillary lymph node dissection.  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  No definitive pneumothorax.  Curvilinear structure right lung apex probably associated with the undersurface of rib as noted above.  This is a call report.   Original Report Authenticated By: Lacy Duverney, M.D.    Dg C-arm 1-60 Min-no Report  12/08/2012  CLINICAL DATA: port-a-cath insertion   C-ARM 1-60 MINUTES  Fluoroscopy was utilized by the requesting physician.  No radiographic  interpretation.      ASSESSMENT: 48 year old female with   #1stage III a( T3 N1) invasive ductal carcinoma she is status post mastectomy with axillary lymph node dissection with one of 12 lymph nodes positive for metastatic disease. Tumor was ER negative PR negative HER-2/neu negative. She had her mastectomy on 11/03/2012 with axillary lymph node dissection performed on 12/08/2012 and she had a Port-A-Cath placed at that time as well. She should have no evidence of metastatic disease elsewhere distally.  #2 patient has completed 4 cycles of FEC that was given dose dense. She reports see this from 01/10/2013 through March 2014.  #3 she will begin Taxol carboplatinum adjuvantly on a weekly basis starting on 03/17/2013 for a total of 12 weeks.  #4 patient now with DVTs she is on Coumadin and Lovenox. She is being followed by the Coumadin clinic.  #5 Ingrown toenail-patient was recommended toenail removal.  We will wait until after her chemotherapy is complete if possible.    #6 Iron deficiency, patient required parenteral iron in December, 2013.  We will recheck iron studies next week.    PLAN:  #1 Doing well.  Proceed with chemotherapy. I offered to  set her up with the lymphedema clinic.  She stated she was going to manage it on her own right  Now, and would stop by there for an exercise glove.    #2 She will continue to take her coumadin under the care of our Coumadin clinic.         #3 We will see Ms. Herrada back for chemotherapy next week.   #4 She will f/u with podiatry after chemotherapy for toenail removal.     All questions were answered. The patient knows to call the clinic with any problems, questions or  concerns. We can certainly see the patient much sooner if necessary.  I spent 25 minutes counseling the patient face to face. The total time spent in the appointment was 30 minutes.  Cherie Ouch Lyn Hollingshead, NP Medical Oncology [facility] Phone: [phone]

## 2013-06-02 ENCOUNTER — Ambulatory Visit (HOSPITAL_BASED_OUTPATIENT_CLINIC_OR_DEPARTMENT_OTHER): Payer: 59 | Admitting: Oncology

## 2013-06-02 ENCOUNTER — Ambulatory Visit: Payer: 59 | Admitting: Pharmacist

## 2013-06-02 ENCOUNTER — Other Ambulatory Visit (HOSPITAL_BASED_OUTPATIENT_CLINIC_OR_DEPARTMENT_OTHER): Payer: 59 | Admitting: Lab

## 2013-06-02 ENCOUNTER — Encounter: Payer: Self-pay | Admitting: Oncology

## 2013-06-02 ENCOUNTER — Ambulatory Visit (HOSPITAL_BASED_OUTPATIENT_CLINIC_OR_DEPARTMENT_OTHER): Payer: 59

## 2013-06-02 VITALS — BP 132/89 | HR 110 | Temp 98.7°F | Resp 20 | Ht 64.0 in | Wt 206.0 lb

## 2013-06-02 DIAGNOSIS — C50419 Malignant neoplasm of upper-outer quadrant of unspecified female breast: Secondary | ICD-10-CM

## 2013-06-02 DIAGNOSIS — C50412 Malignant neoplasm of upper-outer quadrant of left female breast: Secondary | ICD-10-CM

## 2013-06-02 DIAGNOSIS — C773 Secondary and unspecified malignant neoplasm of axilla and upper limb lymph nodes: Secondary | ICD-10-CM

## 2013-06-02 DIAGNOSIS — I82402 Acute embolism and thrombosis of unspecified deep veins of left lower extremity: Secondary | ICD-10-CM

## 2013-06-02 DIAGNOSIS — M7989 Other specified soft tissue disorders: Secondary | ICD-10-CM

## 2013-06-02 DIAGNOSIS — I82409 Acute embolism and thrombosis of unspecified deep veins of unspecified lower extremity: Secondary | ICD-10-CM

## 2013-06-02 DIAGNOSIS — D509 Iron deficiency anemia, unspecified: Secondary | ICD-10-CM

## 2013-06-02 DIAGNOSIS — Z5111 Encounter for antineoplastic chemotherapy: Secondary | ICD-10-CM

## 2013-06-02 DIAGNOSIS — R29898 Other symptoms and signs involving the musculoskeletal system: Secondary | ICD-10-CM

## 2013-06-02 DIAGNOSIS — Z171 Estrogen receptor negative status [ER-]: Secondary | ICD-10-CM

## 2013-06-02 LAB — CBC WITH DIFFERENTIAL/PLATELET
Basophils Absolute: 0 10*3/uL (ref 0.0–0.1)
Eosinophils Absolute: 0 10*3/uL (ref 0.0–0.5)
HCT: 29.5 % — ABNORMAL LOW (ref 34.8–46.6)
HGB: 9.4 g/dL — ABNORMAL LOW (ref 11.6–15.9)
LYMPH%: 25 % (ref 14.0–49.7)
MCV: 95.8 fL (ref 79.5–101.0)
MONO#: 0.5 10*3/uL (ref 0.1–0.9)
MONO%: 7.5 % (ref 0.0–14.0)
NEUT#: 4.6 10*3/uL (ref 1.5–6.5)
NEUT%: 66.5 % (ref 38.4–76.8)
Platelets: 192 10*3/uL (ref 145–400)
RBC: 3.08 10*6/uL — ABNORMAL LOW (ref 3.70–5.45)
WBC: 6.9 10*3/uL (ref 3.9–10.3)
nRBC: 1 % — ABNORMAL HIGH (ref 0–0)

## 2013-06-02 LAB — COMPREHENSIVE METABOLIC PANEL
Albumin: 3.8 g/dL (ref 3.5–5.2)
Alkaline Phosphatase: 111 U/L (ref 39–117)
BUN: 11 mg/dL (ref 6–23)
CO2: 25 mEq/L (ref 19–32)
Calcium: 9 mg/dL (ref 8.4–10.5)
Glucose, Bld: 112 mg/dL — ABNORMAL HIGH (ref 70–99)
Potassium: 3.6 mEq/L (ref 3.5–5.3)

## 2013-06-02 LAB — POCT INR: INR: 2.7

## 2013-06-02 LAB — PROTIME-INR: Protime: 32.4 Seconds — ABNORMAL HIGH (ref 10.6–13.4)

## 2013-06-02 MED ORDER — HEPARIN SOD (PORK) LOCK FLUSH 100 UNIT/ML IV SOLN
500.0000 [IU] | Freq: Once | INTRAVENOUS | Status: AC | PRN
  Administered 2013-06-02: 500 [IU]
  Filled 2013-06-02: qty 5

## 2013-06-02 MED ORDER — ONDANSETRON 16 MG/50ML IVPB (CHCC)
16.0000 mg | Freq: Once | INTRAVENOUS | Status: AC
  Administered 2013-06-02: 16 mg via INTRAVENOUS

## 2013-06-02 MED ORDER — SODIUM CHLORIDE 0.9 % IV SOLN
Freq: Once | INTRAVENOUS | Status: AC
  Administered 2013-06-02: 16:00:00 via INTRAVENOUS

## 2013-06-02 MED ORDER — SODIUM CHLORIDE 0.9 % IJ SOLN
10.0000 mL | INTRAMUSCULAR | Status: DC | PRN
  Administered 2013-06-02: 10 mL
  Filled 2013-06-02: qty 10

## 2013-06-02 MED ORDER — SODIUM CHLORIDE 0.9 % IV SOLN
300.0000 mg | Freq: Once | INTRAVENOUS | Status: AC
  Administered 2013-06-02: 300 mg via INTRAVENOUS
  Filled 2013-06-02: qty 30

## 2013-06-02 MED ORDER — SODIUM CHLORIDE 0.9 % IV SOLN
80.0000 mg/m2 | Freq: Once | INTRAVENOUS | Status: AC
  Administered 2013-06-02: 168 mg via INTRAVENOUS
  Filled 2013-06-02: qty 28

## 2013-06-02 MED ORDER — GABAPENTIN 100 MG PO CAPS: 100.0000 mg | ORAL_CAPSULE | Freq: Three times a day (TID) | ORAL | Status: DC

## 2013-06-02 MED ORDER — HYDROMORPHONE HCL PF 4 MG/ML IJ SOLN
0.5000 mg | Freq: Once | INTRAMUSCULAR | Status: AC
  Administered 2013-06-02: 0.5 mg via INTRAVENOUS

## 2013-06-02 MED ORDER — DEXAMETHASONE SODIUM PHOSPHATE 20 MG/5ML IJ SOLN
20.0000 mg | Freq: Once | INTRAMUSCULAR | Status: AC
  Administered 2013-06-02: 20 mg via INTRAVENOUS

## 2013-06-02 MED ORDER — DIPHENHYDRAMINE HCL 50 MG/ML IJ SOLN
50.0000 mg | Freq: Once | INTRAMUSCULAR | Status: AC
  Administered 2013-06-02: 50 mg via INTRAVENOUS

## 2013-06-02 MED ORDER — FAMOTIDINE IN NACL 20-0.9 MG/50ML-% IV SOLN
20.0000 mg | Freq: Once | INTRAVENOUS | Status: AC
  Administered 2013-06-02: 20 mg via INTRAVENOUS

## 2013-06-02 NOTE — Patient Instructions (Addendum)
Cancer Center Discharge Instructions for Patients Receiving Chemotherapy  Today you received the following chemotherapy agents: taxol, carboplatin  To help prevent nausea and vomiting after your treatment, we encourage you to take your nausea medication.  Take it as often as prescribed.     If you develop nausea and vomiting that is not controlled by your nausea medication, call the clinic. If it is after clinic hours your family physician or the after hours number for the clinic or go to the Emergency Department.   BELOW ARE SYMPTOMS THAT SHOULD BE REPORTED IMMEDIATELY:  *FEVER GREATER THAN 100.5 F  *CHILLS WITH OR WITHOUT FEVER  NAUSEA AND VOMITING THAT IS NOT CONTROLLED WITH YOUR NAUSEA MEDICATION  *UNUSUAL SHORTNESS OF BREATH  *UNUSUAL BRUISING OR BLEEDING  TENDERNESS IN MOUTH AND THROAT WITH OR WITHOUT PRESENCE OF ULCERS  *URINARY PROBLEMS  *BOWEL PROBLEMS  UNUSUAL RASH Items with * indicate a potential emergency and should be followed up as soon as possible.  Feel free to call the clinic you have any questions or concerns. The clinic phone number is [phone].   I have been informed and understand all the instructions given to me. I know to contact the clinic, my physician, or go to the Emergency Department if any problems should occur. I do not have any questions at this time, but understand that I may call the clinic during office hours   should I have any questions or need assistance in obtaining follow up care.    __________________________________________  _____________  __________ Signature of Patient or Authorized Representative            Date                   Time    __________________________________________ Nurse's Signature    

## 2013-06-02 NOTE — Patient Instructions (Addendum)
Coumadin 7mg  daily. Recheck INR in 2 weeks.  We will see in coumadin clinic.  06/20/13 9:15 lab and 9:30 coumadin clinic.

## 2013-06-02 NOTE — Progress Notes (Signed)
Pt c/o cramping 1830.  Pt reports taxoll has caused cramping since 1st treatment - she has taken Tylenol and it has controlled it until the last treatment.  Pt started to vomit.  Notified Norina Buzzard, NP - she ordered 0.5 mg dilaudid.  Dilaudid Administered at 1850.  Taxol completed 1700.  1715 - Pt reported reduced pain and cramping.  Treatment completed without further problems.

## 2013-06-02 NOTE — Progress Notes (Signed)
Pt seen in infusion room today during her last treatment INR=2.7 Pt was prescribed gabapentin today for her neuropathy; no interactions with coumadin No other changes to report Coumadin 7mg  daily.  Recheck INR in 2 weeks.  We will see in coumadin clinic.   06/20/13 9:15 lab and 9:30 coumadin clinic.

## 2013-06-02 NOTE — Patient Instructions (Addendum)
Congratulations you have completed all your chemotherapy!!!!!  We will see you back in 1 week

## 2013-06-07 NOTE — Progress Notes (Signed)
Location of Breast Cancer: Left OUQ  2-3 0 'clock position,   Histology per Pathology Report:09/01/12 Biopsy : Diagnosis 1. Breast, left, needle core biopsy, entire UOQ, sampled 2-3 o'clock- HIGH GRADE DUCTAL CARCINOMA IN SITU WITH NECROSIS.2. Lymph node, needle/core biopsy, left axillary- BENIGN LYMPH NODE TISSUE.- NO METASTATIC CARCINOMA IDENTIFIED.  Receptor Status: 11/03/12 = ER(-), PR (-), Her2-neu ( -)  Did patient present with symptoms : patient felt a lumpiness on left breast herself,this prompted a mammogram  Past/Anticipated interventions by surgeon, if any: 11/03/12: Diagnosis1. Lymph node, sentinel, biopsy, Left #1- METASTATIC CARCINOMA IN 1 OF 1 LYMPH NODE (1/1).2. Breast, simple mastectomy, Left - INVASIVE DUCTAL CARCINOMA, GRADE II/III, SPANNING 15.0 CM.- DUCTAL CARCINOMA IN SITU WITH CALCIFICATIONS, INTERMEDIATE TO HIGH GRADE.- LYMPHOVASCULAR INVASION IS IDENTIFIED. - THE SURGICAL RESECTION MARGINS ARE NEGATIVE FOR CARCINOMA- THERE IS NO EVIDENCE OF CARCINOMA IN 1 OF 1 LYMPH NODE (0/1).- SEE ONCOLOGY TABLE BELOW.3. Breast, excision, Left superior flap -  BENIGN SKIN.- THERE IS NO EVIDENCE OF MALIGNANCY.4. Breast, excision, Left inferior flap- BENIGN SKIN.- THERE IS NO EVIDENCE OF MALIGNANCY. DR. Ovidio Kin comments: 12/08/12:BY Dr.David Ezzard Standing  INSERTION PORT-A-CATH (N/A) - Power Port Placment , mid inner chest,  and Left Axillary Node Dissection  Past/Anticipated interventions by medical oncology, if any: Chemotherapy :  FEC  X 4 cycles completed  End of March 2014; Taxol /Carboplatin completed 06/02/13, offered   genetic testing,,neuropathy +  Lymphedema issues, if any:  no   Pain issues, if any:  yes sharp occasionally in left breast chest wall  S/p surgery  SAFETY ISSUES:  Prior radiation? no  Pacemaker/ICD? no  Possible current pregnancy?no  Is the patient on methotrexate? 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

## 2013-06-08 ENCOUNTER — Ambulatory Visit
Admission: RE | Admit: 2013-06-08 | Discharge: 2013-06-08 | Disposition: A | Discharge status: Home / Self Care | Payer: 59 | Source: Ambulatory Visit | Attending: Radiation Oncology | Admitting: Radiation Oncology

## 2013-06-08 ENCOUNTER — Encounter: Payer: Self-pay | Admitting: Radiation Oncology

## 2013-06-08 VITALS — BP 139/87 | HR 105 | Temp 99.0°F | Resp 20 | Ht 64.0 in | Wt 210.3 lb

## 2013-06-08 DIAGNOSIS — Z87891 Personal history of nicotine dependence: Secondary | ICD-10-CM | POA: Insufficient documentation

## 2013-06-08 DIAGNOSIS — C50412 Malignant neoplasm of upper-outer quadrant of left female breast: Secondary | ICD-10-CM

## 2013-06-08 DIAGNOSIS — C773 Secondary and unspecified malignant neoplasm of axilla and upper limb lymph nodes: Secondary | ICD-10-CM | POA: Insufficient documentation

## 2013-06-08 DIAGNOSIS — Z9221 Personal history of antineoplastic chemotherapy: Secondary | ICD-10-CM | POA: Insufficient documentation

## 2013-06-08 DIAGNOSIS — Z9010 Acquired absence of unspecified breast and nipple: Secondary | ICD-10-CM | POA: Insufficient documentation

## 2013-06-08 DIAGNOSIS — Z171 Estrogen receptor negative status [ER-]: Secondary | ICD-10-CM | POA: Insufficient documentation

## 2013-06-08 DIAGNOSIS — Z7901 Long term (current) use of anticoagulants: Secondary | ICD-10-CM | POA: Insufficient documentation

## 2013-06-08 DIAGNOSIS — C50419 Malignant neoplasm of upper-outer quadrant of unspecified female breast: Secondary | ICD-10-CM | POA: Insufficient documentation

## 2013-06-08 DIAGNOSIS — K219 Gastro-esophageal reflux disease without esophagitis: Secondary | ICD-10-CM | POA: Insufficient documentation

## 2013-06-08 DIAGNOSIS — I82409 Acute embolism and thrombosis of unspecified deep veins of unspecified lower extremity: Secondary | ICD-10-CM | POA: Insufficient documentation

## 2013-06-08 HISTORY — DX: Benign neoplasm of connective and other soft tissue, unspecified: D21.9

## 2013-06-08 HISTORY — DX: Gastro-esophageal reflux disease without esophagitis: K21.9

## 2013-06-08 NOTE — Progress Notes (Signed)
Radiation Oncology         [phone]) [phone] ________________________________  Initial outpatient Consultation - Date: 06/08/2013   Name: Hannah Hale MRN: [id number]   DOB: [birth year]  REFERRING PHYSICIAN: Victorino December, MD  DIAGNOSIS: The encounter diagnosis was Cancer of upper-outer quadrant of female breast, left.  HISTORY OF PRESENT ILLNESS::Hannah Hale is a 47 y.o. female  who I initially saw in multidisciplinary clinic. 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 We recommended upfront surgery with curative intent. Unfortunately at the time of her surgery she was actually found have 15 cm of invasive disease which was grade 2 associated with calcifications with lymphovascular invasion. Her margins were negative. Unfortunately one lymph node was positive. She had a full axillary node dissection and no further lymph nodes were positive. Her tumor was ER/PR negative HER-2 negative. She has completed chemotherapy on 06/02/2013. She had SVC followed by Taxol and carboplatin. She really tolerated her chemotherapy well. She didn't notice any fatigue. She was able to continue working. She recently stopped smoking. She continues on Coumadin for a DVT. She is accompanied by her husband today. She hasn't noticed any new headaches or bone pain.Marland Kitchen  PREVIOUS RADIATION THERAPY: No  PAST MEDICAL HISTORY:  has a past medical history of Coughing; Breast cancer; GERD (gastroesophageal reflux disease); and Fibroids.    PAST SURGICAL HISTORY: Past Surgical History  Procedure Laterality Date  . Simple mastectomy w/ sentinel node biopsy  11/03/2012  . Simple mastectomy with axillary sentinel node biopsy  11/03/2012    Procedure: SIMPLE MASTECTOMY WITH AXILLARY SENTINEL NODE BIOPSY;  Surgeon: Kandis Cocking, MD;  Location: [facility] OR;  Service: General;  Laterality: Left;  . Portacath placement  12/08/2012    Procedure: INSERTION PORT-A-CATH;  Surgeon: Kandis Cocking, MD;  Location: [facility];  Service:  General;  Laterality: N/A;  Power Port Placment and Left Axillary Node Dissection  . Axillary lymph node dissection  12/08/2012    Procedure: AXILLARY LYMPH NODE DISSECTION;  Surgeon: Kandis Cocking, MD;  Location: [facility];  Service: General;  Laterality: Left;    FAMILY HISTORY:  Family History  Problem Relation Age of Onset  . Lung cancer Maternal Grandfather   . Diabetes Mother   . Prostate cancer Father 71  . Stroke Maternal Grandmother   . Lung cancer Cousin     non smoker, died in his 40s; paternal cousin  . Lung cancer Cousin     father's maternal cousin; smoker    SOCIAL HISTORY:  History  Substance Use Topics  . Smoking status: Current Every Day Smoker -- 0.50 packs/day for 29 years    Types: Cigarettes  . Smokeless tobacco: Never Used  . Alcohol Use: 0.0 oz/week     Comment: 6 pack/week    ALLERGIES: Review of patient's allergies indicates no known allergies.  MEDICATIONS:  Current Outpatient Prescriptions  Medication Sig Dispense Refill  . diphenhydrAMINE (SOMINEX) 25 MG tablet Take 50 mg by mouth 2 (two) times daily as needed. For itching      . LORazepam (ATIVAN) 0.5 MG tablet Take 1 tablet (0.5 mg total) by mouth every 6 (six) hours as needed (for nausea or vomitting).  30 tablet  0  . prochlorperazine (COMPAZINE) 10 MG tablet Take 1 tablet (10 mg total) by mouth every 6 (six) hours as needed.  30 tablet  1  . prochlorperazine (COMPAZINE) 25 MG suppository       . SUPER B COMPLEX/C CAPS Take  1 tablet by mouth daily.  30 capsule  2  . warfarin (COUMADIN) 5 MG tablet Take 1-2 tablets by mouth daily as directed.  60 tablet  1  . clindamycin (CLEOCIN-T) 1 % lotion Apply topically 2 (two) times daily.  60 mL  0  . gabapentin (NEURONTIN) 100 MG capsule Take 1 capsule (100 mg total) by mouth 3 (three) times daily.  90 capsule  3  . lidocaine-prilocaine (EMLA) cream Apply topically as needed.  30 g  6  . ondansetron (ZOFRAN) 8 MG tablet        No current  facility-administered medications for this encounter.    REVIEW OF SYSTEMS:  A 15 point review of systems is documented in the electronic medical record. This was obtained by the nursing staff. However, I reviewed this with the patient to discuss relevant findings and make appropriate changes.  Pertinent items are noted in HPI.   PHYSICAL EXAM:  Filed Vitals:   06/08/13 0734  BP: 139/87  Pulse: 105  Temp: 99 F (37.2 C)  Resp: 20  .210 lb 4.8 oz (95.391 kg). She is a pleasant female in no distress sitting comfortably examining table. She is status post left mastectomy. She has good range of motion with her left arm. She has no palpable or visible evidence of tumor recurrence on the left chest wall. She is alert and oriented x3.  LABORATORY DATA:  Lab Results  Component Value Date   WBC 6.9 06/02/2013   HGB 9.4* 06/02/2013   HCT 29.5* 06/02/2013   MCV 95.8 06/02/2013   PLT 192 06/02/2013   Lab Results  Component Value Date   NA 135 06/02/2013   K 3.6 06/02/2013   CL 105 06/02/2013   CO2 25 06/02/2013   Lab Results  Component Value Date   ALT 30 06/02/2013   AST 34 06/02/2013   ALKPHOS 111 06/02/2013   BILITOT 0.2* 06/02/2013     RADIOGRAPHY: No results found.    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 We discussed the role of radiation and decreasing local failures in patients who have a tumor size greater than 5 cm or any lymph nodes positive. She is both of these risk factors in addition to her young age and triple negative status. For that reason I have recommended adjuvant radiation. We discussed the process of simulation the placement tattoos. We discussed 6 weeks of treatment as an outpatient. We discussed the possibility of skin redness and darkness. We discussed the increased complication seen with plastic surgery reconstruction. We  discussed the possibility of heart lung and rib damage. We discussed the use of breath hold technique for cardiac sparing. She has signed informed consent and we were able to schedule her for simulation on Friday. She will start after Fourth of July. She is anxious to get started and get her treatment is finished.  I spent 30 minutes  face to face with the patient and more than 50% of that time was spent in counseling and/or coordination of care.   ------------------------------------------------  Lurline Hare, MD

## 2013-06-08 NOTE — Progress Notes (Signed)
Please see the Nurse Progress Note in the MD Initial Consult Encounter for this patient. 

## 2013-06-09 ENCOUNTER — Ambulatory Visit
Admission: RE | Admit: 2013-06-09 | Discharge: 2013-06-09 | Disposition: A | Discharge status: Home / Self Care | Payer: 59 | Source: Ambulatory Visit | Attending: Radiation Oncology | Admitting: Radiation Oncology

## 2013-06-09 ENCOUNTER — Other Ambulatory Visit (HOSPITAL_BASED_OUTPATIENT_CLINIC_OR_DEPARTMENT_OTHER): Payer: 59 | Admitting: Lab

## 2013-06-09 ENCOUNTER — Telehealth: Payer: Self-pay | Admitting: Oncology

## 2013-06-09 ENCOUNTER — Encounter: Payer: Self-pay | Admitting: Pharmacist

## 2013-06-09 ENCOUNTER — Encounter: Payer: Self-pay | Admitting: Adult Health

## 2013-06-09 ENCOUNTER — Ambulatory Visit (HOSPITAL_BASED_OUTPATIENT_CLINIC_OR_DEPARTMENT_OTHER): Payer: 59 | Admitting: Adult Health

## 2013-06-09 VITALS — BP 139/91 | HR 93 | Temp 98.6°F | Resp 20 | Ht 64.0 in | Wt 209.8 lb

## 2013-06-09 DIAGNOSIS — Y842 Radiological procedure and radiotherapy as the cause of abnormal reaction of the patient, or of later complication, without mention of misadventure at the time of the procedure: Secondary | ICD-10-CM | POA: Insufficient documentation

## 2013-06-09 DIAGNOSIS — C50419 Malignant neoplasm of upper-outer quadrant of unspecified female breast: Secondary | ICD-10-CM

## 2013-06-09 DIAGNOSIS — L988 Other specified disorders of the skin and subcutaneous tissue: Secondary | ICD-10-CM | POA: Insufficient documentation

## 2013-06-09 DIAGNOSIS — C50412 Malignant neoplasm of upper-outer quadrant of left female breast: Secondary | ICD-10-CM

## 2013-06-09 DIAGNOSIS — Z510 Encounter for antineoplastic radiation therapy: Secondary | ICD-10-CM | POA: Insufficient documentation

## 2013-06-09 DIAGNOSIS — Z79899 Other long term (current) drug therapy: Secondary | ICD-10-CM | POA: Insufficient documentation

## 2013-06-09 DIAGNOSIS — I890 Lymphedema, not elsewhere classified: Secondary | ICD-10-CM | POA: Insufficient documentation

## 2013-06-09 DIAGNOSIS — Z7901 Long term (current) use of anticoagulants: Secondary | ICD-10-CM | POA: Insufficient documentation

## 2013-06-09 DIAGNOSIS — R21 Rash and other nonspecific skin eruption: Secondary | ICD-10-CM | POA: Insufficient documentation

## 2013-06-09 LAB — COMPREHENSIVE METABOLIC PANEL (CC13)
ALT: 21 U/L (ref 0–55)
AST: 21 U/L (ref 5–34)
CO2: 23 mEq/L (ref 22–29)
Calcium: 9.1 mg/dL (ref 8.4–10.4)
Chloride: 108 mEq/L (ref 98–109)
Potassium: 3.4 mEq/L — ABNORMAL LOW (ref 3.5–5.1)
Sodium: 143 mEq/L (ref 136–145)
Total Protein: 6.7 g/dL (ref 6.4–8.3)

## 2013-06-09 LAB — CBC WITH DIFFERENTIAL/PLATELET
BASO%: 0.6 % (ref 0.0–2.0)
Eosinophils Absolute: 0 10*3/uL (ref 0.0–0.5)
MONO#: 0.3 10*3/uL (ref 0.1–0.9)
NEUT#: 3.2 10*3/uL (ref 1.5–6.5)
Platelets: 158 10*3/uL (ref 145–400)
RBC: 2.75 10*6/uL — ABNORMAL LOW (ref 3.70–5.45)
RDW: 21.6 % — ABNORMAL HIGH (ref 11.2–14.5)
WBC: 5 10*3/uL (ref 3.9–10.3)
lymph#: 1.5 10*3/uL (ref 0.9–3.3)
nRBC: 1 % — ABNORMAL HIGH (ref 0–0)

## 2013-06-09 NOTE — Progress Notes (Signed)
Name: Hannah Hale   MRN: [id number]  Date:  06/09/2013  DOB: 06/04/1966  Status:outpatient   DIAGNOSIS: Left Breast cancer.  CONSENT VERIFIED: yes SET UP: Patient is setup supine  IMMOBILIZATION:  The following immobilization was used:Custom Moldable Pillow, breast board.  NARRATIVE: Ms. Esh was brought to the CT Simulation planning suite.  Identity was confirmed.  All relevant records and images related to the planned course of therapy were reviewed.  Then, the patient was positioned in a stable reproducible clinical set-up for radiation therapy.  Wires were placed to delineate the clinical extent of breast tissue. A wire was placed on the scar as well.  CT images were obtained.  An isocenter was placed. Skin markings were placed.  The position of the heart was then analyzed.  Due to the proximity of the heart to the chest wall, I felt she would benefit from deep inspiration breath hold for cardiac sparing.  She was then coached and rescanned in the breath hold position.  Acceptable cardiac sparing was achieved. The CT images were loaded into the planning software where the target and avoidance structures were contoured.  The radiation prescription was entered and confirmed. The patient was discharged in stable condition and tolerated simulation well.    TREATMENT PLANNING NOTE/3D Simulation Note Treatment planning then occurred. I have requested : MLC's, isodose plan, basic dose calculation  3D simulation was performed.  I personally supervised and oversaw the construction of 5 medically necessary complex treatment devices in the form of MLCs which will be used for beam modification purposes and to protect critical structures including the heart and lung as well as the immobilization devices which will be used to ensure reproducible set up.  I have requested a dose volume histogram of the heart lung and tumor cavity.    RESPIRATORY MOTION MANAGEMENT SIMULATION - Deep Inspiration Breath  Hold  NARRATIVE:  In order to account for effect of respiratory motion on target structures and other organs in the planning and delivery of radiotherapy, this patient underwent respiratory motion management simulation.  To accomplish this, when the patient was brought to the CT simulation planning suite, a bellows was placed on the her abdomen.  Wave forms of the patient's breathing were obtained. Coaching was performed and practice sessions initiated to monitor her ability to obtain and maintain deep inspiration breath hold.  The CT images were loaded into the planning software and fused with her free breathing images by physics.  Acceptable cardiac sparing was achieved through the use of deep inspiration breath hold.  Planning will be performed on her breath hold scan

## 2013-06-09 NOTE — Telephone Encounter (Signed)
, °

## 2013-06-09 NOTE — Progress Notes (Signed)
OFFICE PROGRESS NOTE  CC Dr. Ovidio Kin Dr. 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 Pierce Crane Dr. Ovidio Kin and Dr. Lurline Hare.Patient originally felt lumpiness in her left breast. This prompted a mammogram.Her mammogram was performed on 08/25/2012 showed pleomorphic calcifications of the entire upper outer left breast measuring 11 x 12 x 15 cm. On exam she had a for a multinodular mass like area over the left upper quadrant. Ultrasound confirmed multiple ill-defined hypoechoic areas. Ultrasound of the left axilla showed a few abnormal appearing lymph nodes. Biopsy of the lymph nodes and the breast was performed on 09/01/2012. 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 The ER was 2% PR 0%.   #2Patient was recommended A mastectomy. 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 Lymphovascular invasion was identified all resection margins were negative the 2 sentinel nodes were removed one was positive for metastatic disease. Patient then went on to have a full axillary lymph node dissection performed the pathology showed 10 lymph nodes negative for metastatic disease therefore 1 of 12 lymph nodes were positive. Final pathologic staging was T T3 N1. Tumor was ER negative PR negative HER-2/neu negative.  #3 patient did have genetic counseling performed but she declined genetic testing at that time. We discussed genetic counseling and testing again in patient is now agreeable and we will get this taken care of.  #4 patient was seen by  Dr. Pierce Crane on 11/17/2012 he recommended PET scan which was negative for metastatic disease. He also recommended adjuvant chemotherapy consisting of FEC given every 2 weeks for a total of 4 cycles. She has now completed the [facility] as of end of March 2014.  She also had iron deficiency and received Feraheme in December.    #5 on 03/17/2013 patient will begin adjuvant Taxol carboplatinum weekly for a total of 12 weeks.  This completed on 06/02/13.  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 female returns for followup visit today after completing her adjuvant chemotherapy.  She has been taking Gabapentin for her numbness.  It remains only in her fingertips.  Otherwise a 10 point ROS is negative.    MEDICAL HISTORY: Past Medical History  Diagnosis Date  . Coughing     cold recent   . Breast cancer     left, Sept 20 2013  . GERD (gastroesophageal reflux disease)   . Fibroids     uterine    ALLERGIES:  has No Known Allergies.  MEDICATIONS:  Current Outpatient Prescriptions  Medication Sig Dispense Refill  . clindamycin (CLEOCIN-T) 1 % lotion Apply topically 2 (two) times daily.  60 mL  0  . dexamethasone (DECADRON) 4 MG tablet       . diphenhydrAMINE (SOMINEX) 25 MG tablet Take 50 mg by mouth 2 (two) times daily as needed. For itching      . gabapentin (NEURONTIN) 100 MG capsule Take 1 capsule (100 mg total) by mouth 3 (three) times  daily.  90 capsule  3  . lidocaine-prilocaine (EMLA) cream Apply topically as needed.  30 g  6  . LORazepam (ATIVAN) 0.5 MG tablet Take 1 tablet (0.5 mg total) by mouth every 6 (six) hours as needed (for nausea or vomitting).  30 tablet  0  . ondansetron (ZOFRAN) 8 MG tablet       . prochlorperazine (COMPAZINE) 10 MG tablet Take 1 tablet (10 mg total) by mouth every 6 (six) hours as needed.  30 tablet  1  . prochlorperazine (COMPAZINE) 25 MG  suppository       . SUPER B COMPLEX/C CAPS Take 1 tablet by mouth daily.  30 capsule  2  . warfarin (COUMADIN) 5 MG tablet Take 1-2 tablets by mouth daily as directed.  60 tablet  1   No current facility-administered medications for this visit.    SURGICAL HISTORY:  Past Surgical History  Procedure Laterality Date  . Simple mastectomy w/ sentinel node biopsy  11/03/2012  . Simple mastectomy with axillary sentinel node biopsy  11/03/2012    Procedure: SIMPLE MASTECTOMY WITH AXILLARY SENTINEL NODE BIOPSY;  Surgeon: Kandis Cocking, MD;  Location: [facility] OR;  Service: General;  Laterality: Left;  . Portacath placement  12/08/2012    Procedure: INSERTION PORT-A-CATH;  Surgeon: Kandis Cocking, MD;  Location: [facility]S;  Service: General;  Laterality: N/A;  Power Port Placment and Left Axillary Node Dissection  . Axillary lymph node dissection  12/08/2012    Procedure: AXILLARY LYMPH NODE DISSECTION;  Surgeon: Kandis Cocking, MD;  Location: [facility];  Service: General;  Laterality: Left;    REVIEW OF SYSTEMS:  General: fatigue (+), night sweats (-), fever (-), pain (-) Lymph: palpable nodes (-) HEENT: vision changes (-), mucositis (-), gum bleeding (-), epistaxis (-) Cardiovascular: chest pain (-), palpitations (-) Pulmonary: shortness of breath (-), dyspnea on exertion (-), cough (-), hemoptysis (-) GI:  Early satiety (-), melena (-), dysphagia (-), nausea/vomiting (-), diarrhea (-) GU: dysuria (-), hematuria (-), incontinence (-) Musculoskeletal: joint swelling (-), joint pain (-), back pain (-) Neuro: weakness (-), numbness (+), headache (-), confusion (-) Skin: Rash (-), lesions (-), dryness (-) Psych: depression (-), suicidal/homicidal ideation (-), feeling of hopelessness (-)  PHYSICAL EXAMINATION: Blood pressure 139/91, pulse 93, temperature 98.6 F (37 C), temperature source Oral, resp. rate 20, height 5\' 4"  (1.626 m), weight 209 lb 12.8 oz (95.165 kg). Body mass index is 35.99  kg/(m^2). General: Patient is a well appearing female in no acute distress HEENT: PERRLA, sclerae anicteric no conjunctival pallor, MMM Neck: supple, no palpable adenopathy Lungs: clear to auscultation bilaterally, no wheezes, rhonchi, or rales Cardiovascular: regular rate rhythm, S1, S2, no murmurs, rubs or gallops,  Abdomen: Soft, non-tender, non-distended, normoactive bowel sounds, no HSM Extremities: warm and well perfused, no clubbing, cyanosis, or edema, swelling in left upper extremity  Skin: No rashes or lesions  Neuro: Non-focal Breasts: left mastectomy site without nodularity, well healed, right breast no masses/nodularity ECOG PERFORMANCE STATUS: 0 - Asymptomatic   LABORATORY DATA: Lab Results  Component Value Date   WBC 5.0 06/09/2013   HGB 8.4* 06/09/2013   HCT 26.3* 06/09/2013   MCV 95.6 06/09/2013   PLT 158 06/09/2013      Chemistry      Component Value Date/Time   NA 143 06/09/2013 0858   NA 135 06/02/2013 1401   K 3.4* 06/09/2013 0858   K 3.6 06/02/2013 1401   CL 105 06/02/2013 1401  CL 105 05/26/2013 0828   CO2 23 06/09/2013 0858   CO2 25 06/02/2013 1401   BUN 5.9* 06/09/2013 0858   BUN 11 06/02/2013 1401   CREATININE 0.6 06/09/2013 0858   CREATININE 0.56 06/02/2013 1401      Component Value Date/Time   CALCIUM 9.1 06/09/2013 0858   CALCIUM 9.0 06/02/2013 1401   ALKPHOS 103 06/09/2013 0858   ALKPHOS 111 06/02/2013 1401   AST 21 06/09/2013 0858   AST 34 06/02/2013 1401   ALT 21 06/09/2013 0858   ALT 30 06/02/2013 1401   BILITOT <0.20 Repeated and Verified 06/09/2013 0858   BILITOT 0.2* 06/02/2013 1401       RADIOGRAPHIC STUDIES:  Dg Chest Port 1 View  12/08/2012  *RADIOLOGY REPORT*  Clinical Data: Post line placement.  Rule out pneumothorax.  The  PORTABLE CHEST - 1 VIEW  Comparison: 11/25/2012 PET CT.  10/26/2012 chest x-ray.  Findings: Curvilinear appearance right apex along the undersurface of the posterior aspect of the right second rib probably related to rib  rather than pneumothorax.   This can be assessed on follow-up examination if there are any progressive symptoms to suggest pneumothorax.  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  Cardiomegaly.  Mild central pulmonary vascular prominence.  Prior left breast surgery and left axillary lymph node dissection.  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  No definitive pneumothorax.  Curvilinear structure right lung apex probably associated with the undersurface of rib as noted above.  This is a call report.   Original Report Authenticated By: Lacy Duverney, M.D.    Dg C-arm 1-60 Min-no Report  12/08/2012  CLINICAL DATA: port-a-cath insertion   C-ARM 1-60 MINUTES  Fluoroscopy was utilized by the requesting physician.  No radiographic  interpretation.      ASSESSMENT: 47 year old female with   #1stage III a( T3 N1) invasive ductal carcinoma she is status post mastectomy with axillary lymph node dissection with one of 12 lymph nodes positive for metastatic disease. Tumor was ER negative PR negative HER-2/neu negative. She had her mastectomy on 11/03/2012 with axillary lymph node dissection performed on 12/08/2012 and she had a Port-A-Cath placed at that time as well. She should have no evidence of metastatic disease elsewhere distally.  #2 patient has completed 4 cycles of FEC that was given dose dense. She received this from 01/10/2013 through March 2014.  #3 she underwent Taxol carboplatinum adjuvantly on a weekly basis starting on 03/17/2013 for a total of 12 weeks.  #4 patient also has DVTs she is on Coumadin and Lovenox. She is being followed by the Coumadin clinic.  #5 Ingrown toenail-patient was recommended toenail removal.  She has yet to have this done.     #6 Iron deficiency, patient required parenteral iron in December, 2013. We will recheck iron studies in 2 months.   PLAN:  #1 Doing well.  Labs are stable. She will proceed with radiation therapy beginning on  06/19/13.    #2 She will continue to take her coumadin under the care of our Coumadin clinic.         #3 We will see Ms. Abbey back in 2 months.    #4 She will f/u with podiatry for toenail removal.     All questions were answered. The patient knows to call the clinic with any problems, questions or concerns. We can certainly see the patient much sooner if necessary.  I spent 15 minutes counseling the patient face to face.  The total time spent in the appointment was 30 minutes.  Cherie Ouch Lyn Hollingshead, NP Medical Oncology [facility] Phone: [phone]

## 2013-06-09 NOTE — Patient Instructions (Addendum)
Doing well.  Labs are stable.  We will see you back in 2 months.  Please call us if you have any questions or concerns.

## 2013-06-09 NOTE — Progress Notes (Signed)
Pt stopped by to pick up coumadin samples. Coumadin 1mg  tablets provided: (x 40 tablets)  Exp: 01/2014, Lot: [id number]

## 2013-06-11 NOTE — Progress Notes (Signed)
OFFICE PROGRESS NOTE  CC Dr. Ovidio Kin Dr. 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 Pierce Crane Dr. Ovidio Kin and Dr. Lurline Hare.Patient originally felt lumpiness in her left breast. This prompted a mammogram.Her mammogram was performed on 08/25/2012 showed pleomorphic calcifications of the entire upper outer left breast measuring 11 x 12 x 15 cm. On exam she had a for a multinodular mass like area over the left upper quadrant. Ultrasound confirmed multiple ill-defined hypoechoic areas. Ultrasound of the left axilla showed a few abnormal appearing lymph nodes. Biopsy of the lymph nodes and the breast was performed on 09/01/2012. 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 The ER was 2% PR 0%.   #2Patient was recommended A mastectomy. 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 Lymphovascular invasion was identified all resection margins were negative the 2 sentinel nodes were removed one was positive for metastatic disease. Patient then went on to have a full axillary lymph node dissection performed the pathology showed 10 lymph nodes negative for metastatic disease therefore 1 of 12 lymph nodes were positive. Final pathologic staging was T T3 N1. Tumor was ER negative PR negative HER-2/neu negative.  #3 patient did have genetic counseling performed but she declined genetic testing at that time. We discussed genetic counseling and testing again in patient is now agreeable and we will get this taken care of.  #4 patient was seen by  Dr. Pierce Crane on 11/17/2012 he recommended PET scan which was negative for metastatic disease. He also recommended adjuvant chemotherapy consisting of FEC given every 2 weeks for a total of 4 cycles. She has now completed the [facility] as of end of March 2014.  She also had iron deficiency and received Feraheme in December.    #5 on 03/17/2013 patient will begin adjuvant Taxol carboplatinum weekly for a total of 12 weeks.  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 female returns for followup visit today prior to adjuvant chemotherapy for her left breast cancer.  She contininues to take Coumadin and is followed by the coumadin clinic.  She continues to have mildly progressive numbness in the tips of her thumbs bilaterally and her great toes.  She has no motor deficit in her thumbs and great toes.  She continues to have an ingrown toenail that she is waiting to have removed, but otherwise denies fevers, chills, nausea, vomiting, constipation, diarrhea, skin changes or any other concerns.    MEDICAL HISTORY: Past Medical History  Diagnosis Date  . Coughing     cold recent   . Breast cancer     left, Sept 20 2013  . GERD (gastroesophageal reflux disease)   . Fibroids     uterine    ALLERGIES:  has No Known Allergies.  MEDICATIONS:  Current Outpatient Prescriptions  Medication Sig Dispense Refill  . clindamycin (CLEOCIN-T) 1 % lotion Apply topically 2 (two) times daily.  60 mL  0  . diphenhydrAMINE (SOMINEX) 25 MG tablet  Take 50 mg by mouth 2 (two) times daily as needed. For itching      . lidocaine-prilocaine (EMLA) cream Apply topically as needed.  30 g  6  . LORazepam (ATIVAN) 0.5 MG tablet Take 1 tablet (0.5 mg total) by mouth every 6 (six) hours as needed (for nausea or vomitting).  30 tablet  0  . ondansetron (ZOFRAN) 8 MG tablet       . prochlorperazine (COMPAZINE) 10 MG tablet Take 1  tablet (10 mg total) by mouth every 6 (six) hours as needed.  30 tablet  1  . SUPER B COMPLEX/C CAPS Take 1 tablet by mouth daily.  30 capsule  2  . warfarin (COUMADIN) 5 MG tablet Take 1-2 tablets by mouth daily as directed.  60 tablet  1  . dexamethasone (DECADRON) 4 MG tablet       . gabapentin (NEURONTIN) 100 MG capsule Take 1 capsule (100 mg total) by mouth 3 (three) times daily.  90 capsule  3  . prochlorperazine (COMPAZINE) 25 MG suppository        No current facility-administered medications for this visit.    SURGICAL HISTORY:  Past Surgical History  Procedure Laterality Date  . Simple mastectomy w/ sentinel node biopsy  11/03/2012  . Simple mastectomy with axillary sentinel node biopsy  11/03/2012    Procedure: SIMPLE MASTECTOMY WITH AXILLARY SENTINEL NODE BIOPSY;  Surgeon: Kandis Cocking, MD;  Location: [facility] OR;  Service: General;  Laterality: Left;  . Portacath placement  12/08/2012    Procedure: INSERTION PORT-A-CATH;  Surgeon: Kandis Cocking, MD;  Location: [facility];  Service: General;  Laterality: N/A;  Power Port Placment and Left Axillary Node Dissection  . Axillary lymph node dissection  12/08/2012    Procedure: AXILLARY LYMPH NODE DISSECTION;  Surgeon: Kandis Cocking, MD;  Location: [facility];  Service: General;  Laterality: Left;    REVIEW OF SYSTEMS:  General: fatigue (-), night sweats (-), fever (-), pain (-) Lymph: palpable nodes (-) HEENT: vision changes (-), mucositis (-), gum bleeding (-), epistaxis (-) Cardiovascular: chest pain (-), palpitations (-) Pulmonary: shortness of breath (-), dyspnea on exertion (-), cough (-), hemoptysis (-) GI:  Early satiety (-), melena (-), dysphagia (-), nausea/vomiting (-), diarrhea (-) GU: dysuria (-), hematuria (-), incontinence (-) Musculoskeletal: joint swelling (-), joint pain (-), back pain (-) Neuro: weakness (-), numbness (-), headache (-), confusion (-) Skin: Rash (-), lesions (-), dryness (-) Psych: depression (-),  suicidal/homicidal ideation (-), feeling of hopelessness (-)  PHYSICAL EXAMINATION: Blood pressure 157/90, pulse 115, temperature 98 F (36.7 C), temperature source Oral, resp. rate 20, height 5\' 4"  (1.626 m), weight 207 lb (93.895 kg). Body mass index is 35.51 kg/(m^2). General: Patient is a well appearing female in no acute distress HEENT: PERRLA, sclerae anicteric no conjunctival pallor, MMM Neck: supple, no palpable adenopathy Lungs: clear to auscultation bilaterally, no wheezes, rhonchi, or rales Cardiovascular: regular rate rhythm, S1, S2, no murmurs, rubs or gallops,  Abdomen: Soft, non-tender, non-distended, normoactive bowel sounds, no HSM Extremities: warm and well perfused, no clubbing, cyanosis, or edema Skin: No rashes or lesions  Neuro: Non-focal Breasts: left mastectomy site without nodularity, well healed, right breast no masses/nodularity ECOG PERFORMANCE STATUS: 0 - Asymptomatic   LABORATORY DATA: Lab Results  Component Value Date   WBC 5.0 06/09/2013   HGB 8.4* 06/09/2013   HCT 26.3* 06/09/2013   MCV 95.6 06/09/2013   PLT 158 06/09/2013      Chemistry  Component Value Date/Time   NA 143 06/09/2013 0858   NA 135 06/02/2013 1401   K 3.4* 06/09/2013 0858   K 3.6 06/02/2013 1401   CL 105 06/02/2013 1401   CL 105 05/26/2013 0828   CO2 23 06/09/2013 0858   CO2 25 06/02/2013 1401   BUN 5.9* 06/09/2013 0858   BUN 11 06/02/2013 1401   CREATININE 0.6 06/09/2013 0858   CREATININE 0.56 06/02/2013 1401      Component Value Date/Time   CALCIUM 9.1 06/09/2013 0858   CALCIUM 9.0 06/02/2013 1401   ALKPHOS 103 06/09/2013 0858   ALKPHOS 111 06/02/2013 1401   AST 21 06/09/2013 0858   AST 34 06/02/2013 1401   ALT 21 06/09/2013 0858   ALT 30 06/02/2013 1401   BILITOT <0.20 Repeated and Verified 06/09/2013 0858   BILITOT 0.2* 06/02/2013 1401       RADIOGRAPHIC STUDIES:  Dg Chest Port 1 View  12/08/2012  *RADIOLOGY REPORT*  Clinical Data: Post line placement.  Rule out pneumothorax.   The  PORTABLE CHEST - 1 VIEW  Comparison: 11/25/2012 PET CT.  10/26/2012 chest x-ray.  Findings: Curvilinear appearance right apex along the undersurface of the posterior aspect of the right second rib probably related to rib rather than pneumothorax.   This can be assessed on follow-up examination if there are any progressive symptoms to suggest pneumothorax.  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  Cardiomegaly.  Mild central pulmonary vascular prominence.  Prior left breast surgery and left axillary lymph node dissection.  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  No definitive pneumothorax.  Curvilinear structure right lung apex probably associated with the undersurface of rib as noted above.  This is a call report.   Original Report Authenticated By: Lacy Duverney, M.D.    Dg C-arm 1-60 Min-no Report  12/08/2012  CLINICAL DATA: port-a-cath insertion   C-ARM 1-60 MINUTES  Fluoroscopy was utilized by the requesting physician.  No radiographic  interpretation.      ASSESSMENT: 47 year old female with   #1stage III a( T3 N1) invasive ductal carcinoma she is status post mastectomy with axillary lymph node dissection with one of 12 lymph nodes positive for metastatic disease. Tumor was ER negative PR negative HER-2/neu negative. She had her mastectomy on 11/03/2012 with axillary lymph node dissection performed on 12/08/2012 and she had a Port-A-Cath placed at that time as well. She should have no evidence of metastatic disease elsewhere distally.  #2 patient has completed 4 cycles of FEC that was given dose dense. She reports see this from 01/10/2013 through March 2014.  #3 she will begin Taxol carboplatinum adjuvantly on a weekly basis starting on 03/17/2013 for a total of 12 weeks.  #4 patient now with DVTs she is on Coumadin and Lovenox. She is being followed by the Coumadin clinic.  #5 Ingrown toenail-patient was recommended toenail removal.  We will wait until  after her chemotherapy is complete if possible.    #6 Iron deficiency, patient required parenteral iron in December, 2013.  We will recheck iron studies next week.    PLAN:  #1proceed with cycle 10 of taxol/carboplatin  #2 return in 1 week for follow up  All questions were answered. The patient knows to call the clinic with any problems, questions or concerns. We can certainly see the patient much sooner if necessary.  I spent 25 minutes counseling the patient face to face. The total time spent in the appointment was 30 minutes.  Drue Second, MD Medical/Oncology [facility] [phone] (beeper) [phone] (Office)

## 2013-06-16 IMAGING — CT NM PET TUM IMG INITIAL (PI) SKULL BASE T - THIGH
1 of 4 series · 2 of 25 positions shown · IV contrast (350 OM)
Comparison: No priors.

CLINICAL DATA: Initial treatment strategy for breast cancer.
Staging examination.

NUCLEAR MEDICINE PET SKULL BASE TO THIGH
Fasting Blood Glucose:  122
TECHNIQUE: 18.7 mCi F-18 FDG was injected intravenously. CT data
was obtained and used for attenuation correction and anatomic
localization only.  (This was not acquired as a diagnostic CT
examination.) Additional exam technical data entered on
technologist worksheet.

[Series 2: ct images · axial · 3.8mm · 0.98mm/px · z∈[-75,+0]mm · 2 of 223 slices shown]
[im 200/223  brain]
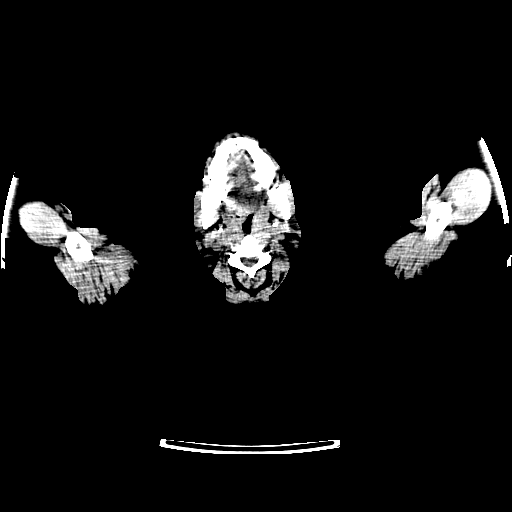
[im 223/223  brain]
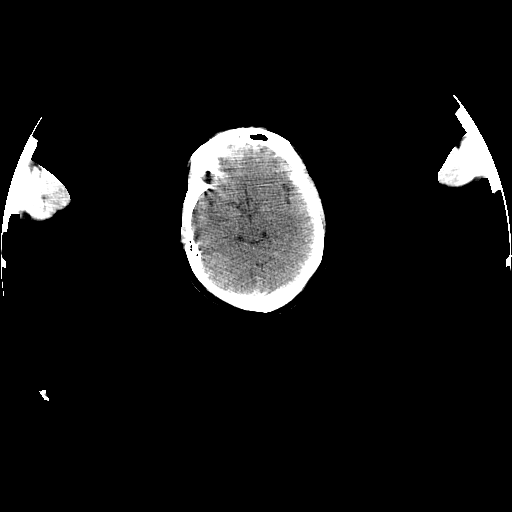

[2 of 25 positions shown; findings below may reference images not displayed]

FINDINGS: Neck: No hypermetabolic lymph nodes in the neck.

Chest:  Postoperative changes of the modified radical left
mastectomy.  There appears to be a small relatively linear
appearing postoperative fluid collection extending posteriorly from
the lateral margin of the left pectoralis major, which demonstrates
no hypermetabolic activity, likely to represent a small
postoperative seroma.  Some reactive sized left axillary lymph
nodes are noted, however, these are not hypermetabolic on the PET
portion of the examination.  No definite soft tissue mass is
identified along the left chest wall to suggest residual or local
recurrence of disease.  No hypermetabolic mediastinal or hilar
nodes.  No suspicious pulmonary nodules on the CT scan. Moderate
sized hiatal hernia.  Mild cardiomegaly.

Abdomen/Pelvis:  Within segment 2 of the liver there is a 3.2 x
cm intermediate attenuation (38 HU) lesion that does not
demonstrate hypermetabolic activity.  This is highly nonspecific.
The remainder of the liver is otherwise unremarkable in appearance
on this noncontrast CT examination.  The unenhanced appearance of
the gallbladder, pancreas, spleen, bilateral adrenal glands and
bilateral kidneys is unremarkable. Normal appendix.
Atherosclerosis throughout the abdominal and pelvic vasculature,
without definite aneurysm.  The uterus is markedly enlarged and
heterogeneous in appearance with heterogeneous internal metabolic
activity, without definite dominant hypermetabolic focus; imaging
findings are nonspecific, but likely related to a fibroid uterus.
No ascites or pneumoperitoneum and no pathologic distension of
small bowel.  No definite pathologically enlarged or hypermetabolic
lymphadenopathy identified within the abdomen or pelvis.

Skeleton:  There is some mild sclerosis adjacent to the symphysis
pubis, predominately on the left, and sclerosis around the
sacroiliac joints bilaterally (predominately on the iliac portions)
right greater than left.  On the PET portion of the examination
there is a small focus of hypermetabolic activity associated with
the iliac border of the right sacroiliac joint (SUVmax = 5.4)no
corresponding bony abnormality (above and beyond the underlying
sclerosis) on the CT portion of the examination to strongly suggest
the presence of a metastatic lesion in this area.  No other
suspicious bony abnormalities are noted.
IMPRESSION: 1.  Status post left-sided modified radical mastectomy without
evidence to suggest residual disease along the left chest wall
sidewall or definite metastatic disease in the neck, chest, abdomen
or pelvis.
2.  There is a tiny nonspecific focus of hypermetabolic activity
associated with the right sacroiliac joint, as detailed above,
likely inflammatory and related to underlying degenerative disease,
as above.  Attention to this region on any future follow up studies
is recommended to ensure stability or resolution.
3.  Moderate sized hiatal hernia.
4.  Small postoperative seroma in the left breast.
5.  Large fibroid uterus.

## 2013-06-19 ENCOUNTER — Ambulatory Visit
Admission: RE | Admit: 2013-06-19 | Discharge: 2013-06-19 | Disposition: A | Discharge status: Home / Self Care | Payer: 59 | Source: Ambulatory Visit | Attending: Radiation Oncology | Admitting: Radiation Oncology

## 2013-06-19 DIAGNOSIS — C50412 Malignant neoplasm of upper-outer quadrant of left female breast: Secondary | ICD-10-CM

## 2013-06-20 ENCOUNTER — Ambulatory Visit (HOSPITAL_BASED_OUTPATIENT_CLINIC_OR_DEPARTMENT_OTHER): Payer: 59 | Admitting: Pharmacist

## 2013-06-20 ENCOUNTER — Other Ambulatory Visit (HOSPITAL_BASED_OUTPATIENT_CLINIC_OR_DEPARTMENT_OTHER): Payer: 59 | Admitting: Lab

## 2013-06-20 ENCOUNTER — Ambulatory Visit
Admission: RE | Admit: 2013-06-20 | Discharge: 2013-06-20 | Disposition: A | Discharge status: Home / Self Care | Payer: 59 | Source: Ambulatory Visit | Attending: Radiation Oncology | Admitting: Radiation Oncology

## 2013-06-20 ENCOUNTER — Encounter: Payer: Self-pay | Admitting: Radiation Oncology

## 2013-06-20 VITALS — BP 142/88 | HR 90 | Resp 18 | Wt 211.9 lb

## 2013-06-20 DIAGNOSIS — I82402 Acute embolism and thrombosis of unspecified deep veins of left lower extremity: Secondary | ICD-10-CM

## 2013-06-20 DIAGNOSIS — C50412 Malignant neoplasm of upper-outer quadrant of left female breast: Secondary | ICD-10-CM

## 2013-06-20 DIAGNOSIS — I82409 Acute embolism and thrombosis of unspecified deep veins of unspecified lower extremity: Secondary | ICD-10-CM

## 2013-06-20 LAB — PROTIME-INR
INR: 2.1 (ref 2.00–3.50)
Protime: 25.2 Seconds — ABNORMAL HIGH (ref 10.6–13.4)

## 2013-06-20 LAB — POCT INR: INR: 2.1

## 2013-06-20 NOTE — Progress Notes (Signed)
Weekly Management Note Current Dose: 1.8  Gy  Projected Dose: 50.4 Gy   Narrative:  The patient presents for routine under treatment assessment.  CBCT/MVCT images/Port film x-rays were reviewed.  The chart was checked. Doing well. Post sim education performed. No complaints.   Physical Findings: Weight: 211 lb 14.4 oz (96.117 kg). Unchanged  Impression:  The patient is tolerating radiation.  Plan:  Continue treatment as planned. Start radiaplex

## 2013-06-20 NOTE — Progress Notes (Signed)
Denies pain, skin changes or fatigue. Oriented patient to staff and routine of the clinic. Provided patient with RADIATION THERAPY AND YOU handbook then, reviewed pertinent information. Educated patient on potential side effects and management such as, fatigue and skin changes. Provided patient with radiaplex and alra then, directed upon use.

## 2013-06-20 NOTE — Progress Notes (Signed)
Pt seen in clinic today prior to starting radiation this AM. She has no changes to report INR=2.1 on 7 mg daily Will continue Coumadin 7mg  daily.  Recheck INR in 3 weeks.   We will see in coumadin clinic 07/10/13 9:00 lab, 9:15 coumadin clinic and radonc at 10:10.

## 2013-06-20 NOTE — Patient Instructions (Signed)
Coumadin 7mg  daily. Recheck INR in 3 weeks.  We will see in coumadin clinic.  07/10/13 9:00 lab and 9:15 coumadin clinic

## 2013-06-20 NOTE — Progress Notes (Signed)
  Radiation Oncology         [phone]) [phone] ________________________________  Name: Hannah Hale MRN: [id number]  Date: 06/19/2013  DOB: September 28, 1966  Simulation Verification Note  Status: outpatient  NARRATIVE: The patient was brought to the treatment unit and placed in the planned treatment position. The clinical setup was verified. Then port films were obtained and uploaded to the radiation oncology medical record software.  The treatment beams were carefully compared against the planned radiation fields. The position location and shape of the radiation fields was reviewed. The targeted volume of tissue appears appropriately covered by the radiation beams. Organs at risk appear to be excluded as planned.  Based on my personal review, I approved the simulation verification. The patient's treatment will proceed as planned.  ------------------------------------------------  Lurline Hare, MD

## 2013-06-21 ENCOUNTER — Ambulatory Visit
Admission: RE | Admit: 2013-06-21 | Discharge: 2013-06-21 | Disposition: A | Discharge status: Home / Self Care | Payer: 59 | Source: Ambulatory Visit | Attending: Radiation Oncology | Admitting: Radiation Oncology

## 2013-06-22 ENCOUNTER — Ambulatory Visit
Admission: RE | Admit: 2013-06-22 | Discharge: 2013-06-22 | Disposition: A | Discharge status: Home / Self Care | Payer: 59 | Source: Ambulatory Visit | Attending: Radiation Oncology | Admitting: Radiation Oncology

## 2013-06-23 ENCOUNTER — Ambulatory Visit
Admission: RE | Admit: 2013-06-23 | Discharge: 2013-06-23 | Disposition: A | Discharge status: Home / Self Care | Payer: 59 | Source: Ambulatory Visit | Attending: Radiation Oncology | Admitting: Radiation Oncology

## 2013-06-23 MED ORDER — RADIAPLEXRX EX GEL
Freq: Once | CUTANEOUS | Status: AC
  Administered 2013-06-23: 17:00:00 via TOPICAL

## 2013-06-23 MED ORDER — ALRA NON-METALLIC DEODORANT (RAD-ONC)
1.0000 "application " | Freq: Once | TOPICAL | Status: AC
  Administered 2013-06-23: 1 via TOPICAL

## 2013-06-23 NOTE — Addendum Note (Signed)
Encounter addended by: Agnes Lawrence, RN on: 06/23/2013  5:29 PM<BR>     Documentation filed: Chief Complaint Section, Inpatient MAR, Inpatient Document Flowsheet, Inpatient Patient Education, Orders

## 2013-06-25 NOTE — Progress Notes (Signed)
OFFICE PROGRESS NOTE  CC Dr. Ovidio Kin Dr. 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 Pierce Crane Dr. Ovidio Kin and Dr. Lurline Hare.Patient originally felt lumpiness in her left breast. This prompted a mammogram.Her mammogram was performed on 08/25/2012 showed pleomorphic calcifications of the entire upper outer left breast measuring 11 x 12 x 15 cm. On exam she had a for a multinodular mass like area over the left upper quadrant. Ultrasound confirmed multiple ill-defined hypoechoic areas. Ultrasound of the left axilla showed a few abnormal appearing lymph nodes. Biopsy of the lymph nodes and the breast was performed on 09/01/2012. 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 The ER was 2% PR 0%.   #2Patient was recommended A mastectomy. 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 Lymphovascular invasion was identified all resection margins were negative the 2 sentinel nodes were removed one was positive for metastatic disease. Patient then went on to have a full axillary lymph node dissection performed the pathology showed 10 lymph nodes negative for metastatic disease therefore 1 of 12 lymph nodes were positive. Final pathologic staging was T T3 N1. Tumor was ER negative PR negative HER-2/neu negative.  #3 patient did have genetic counseling performed but she declined genetic testing at that time. We discussed genetic counseling and testing again in patient is now agreeable and we will get this taken care of.  #4 patient was seen by  Dr. Pierce Crane on 11/17/2012 he recommended PET scan which was negative for metastatic disease. He also recommended adjuvant chemotherapy consisting of FEC given every 2 weeks for a total of 4 cycles. She has now completed the [facility] as of end of March 2014.  She also had iron deficiency and received Feraheme in December.    #5 on 03/17/2013 patient will begin adjuvant Taxol carboplatinum weekly for a total of 12 weeks.  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 female returns for followup visit today prior to adjuvant chemotherapy for her left breast cancer.  She contininues to take Coumadin and is followed by the coumadin clinic.  She continues to have mild numbness in her fingertips and toes, and she is taking super b complex daily and it is stable.  She does have swelling in her left arm consistent with lymphedema. She is wearing her glove today.  She continues to have an ingrown toenail that she is waiting to have removed, but otherwise denies fevers, chills, nausea, vomiting, constipation, diarrhea, skin changes or any other concerns.    MEDICAL HISTORY: Past Medical History  Diagnosis Date  . Coughing     cold recent   . Breast cancer     left, Sept 20 2013  . GERD (gastroesophageal reflux disease)   . Fibroids     uterine    ALLERGIES:  has No Known Allergies.  MEDICATIONS:  Current Outpatient Prescriptions  Medication Sig Dispense Refill  . diphenhydrAMINE (SOMINEX) 25 MG tablet Take 50 mg by mouth 2 (two)  times daily as needed. For itching      . lidocaine-prilocaine (EMLA) cream Apply topically as needed.  30 g  6  . LORazepam (ATIVAN) 0.5 MG tablet Take 1 tablet (0.5 mg total) by mouth every 6 (six) hours as needed (for nausea or vomitting).  30 tablet  0  . ondansetron (ZOFRAN) 8 MG tablet       . prochlorperazine (COMPAZINE) 10 MG tablet Take 1 tablet (10 mg total) by mouth  every 6 (six) hours as needed.  30 tablet  1  . SUPER B COMPLEX/C CAPS Take 1 tablet by mouth daily.  30 capsule  2  . warfarin (COUMADIN) 5 MG tablet Take 1-2 tablets by mouth daily as directed.  60 tablet  1  . clindamycin (CLEOCIN-T) 1 % lotion Apply topically 2 (two) times daily.  60 mL  0  . dexamethasone (DECADRON) 4 MG tablet       . gabapentin (NEURONTIN) 100 MG capsule Take 1 capsule (100 mg total) by mouth 3 (three) times daily.  90 capsule  3  . non-metallic deodorant (ALRA) MISC Apply 1 application topically daily as needed.      . prochlorperazine (COMPAZINE) 25 MG suppository       . Wound Cleansers (RADIAPLEX EX) Apply topically.       No current facility-administered medications for this visit.    SURGICAL HISTORY:  Past Surgical History  Procedure Laterality Date  . Simple mastectomy w/ sentinel node biopsy  11/03/2012  . Simple mastectomy with axillary sentinel node biopsy  11/03/2012    Procedure: SIMPLE MASTECTOMY WITH AXILLARY SENTINEL NODE BIOPSY;  Surgeon: Kandis Cocking, MD;  Location: [facility] OR;  Service: General;  Laterality: Left;  . Portacath placement  12/08/2012    Procedure: INSERTION PORT-A-CATH;  Surgeon: Kandis Cocking, MD;  Location: [facility] ORS;  Service: General;  Laterality: N/A;  Power Port Placment and Left Axillary Node Dissection  . Axillary lymph node dissection  12/08/2012    Procedure: AXILLARY LYMPH NODE DISSECTION;  Surgeon: Kandis Cocking, MD;  Location: [facility] ORS;  Service: General;  Laterality: Left;    REVIEW OF SYSTEMS:  General: fatigue (+), night sweats (-), fever (-), pain (-) Lymph: palpable nodes (-) HEENT: vision changes (-), mucositis (-), gum bleeding (-), epistaxis (-) Cardiovascular: chest pain (-), palpitations (-) Pulmonary: shortness of breath (-), dyspnea on exertion (-), cough (-), hemoptysis (-) GI:  Early satiety (-), melena (-), dysphagia (-), nausea/vomiting (-), diarrhea (-) GU: dysuria (-), hematuria (-), incontinence  (-) Musculoskeletal: joint swelling (-), joint pain (-), back pain (-) Neuro: weakness (-), numbness (+), headache (-), confusion (-) Skin: Rash (-), lesions (-), dryness (-) Psych: depression (-), suicidal/homicidal ideation (-), feeling of hopelessness (-)  PHYSICAL EXAMINATION: Blood pressure 132/89, pulse 110, temperature 98.7 F (37.1 C), temperature source Oral, resp. rate 20, height 5\' 4"  (1.626 m), weight 206 lb (93.441 kg). Body mass index is 35.34 kg/(m^2). General: Patient is a well appearing female in no acute distress HEENT: PERRLA, sclerae anicteric no conjunctival pallor, MMM Neck: supple, no palpable adenopathy Lungs: clear to auscultation bilaterally, no wheezes, rhonchi, or rales Cardiovascular: regular rate rhythm, S1, S2, no murmurs, rubs or gallops,  Abdomen: Soft, non-tender, non-distended, normoactive bowel sounds, no HSM Extremities: warm and well perfused, no clubbing, cyanosis, or edema, swelling in left upper extremity  Skin: No rashes or lesions  Neuro: Non-focal Breasts: left mastectomy site without nodularity, well healed, right breast no masses/nodularity ECOG PERFORMANCE  STATUS: 0 - Asymptomatic   LABORATORY DATA: Lab Results  Component Value Date   WBC 5.0 06/09/2013   HGB 8.4* 06/09/2013   HCT 26.3* 06/09/2013   MCV 95.6 06/09/2013   PLT 158 06/09/2013      Chemistry      Component Value Date/Time   NA 143 06/09/2013 0858   NA 135 06/02/2013 1401   K 3.4* 06/09/2013 0858   K 3.6 06/02/2013 1401   CL 105 06/02/2013 1401   CL 105 05/26/2013 0828   CO2 23 06/09/2013 0858   CO2 25 06/02/2013 1401   BUN 5.9* 06/09/2013 0858   BUN 11 06/02/2013 1401   CREATININE 0.6 06/09/2013 0858   CREATININE 0.56 06/02/2013 1401      Component Value Date/Time   CALCIUM 9.1 06/09/2013 0858   CALCIUM 9.0 06/02/2013 1401   ALKPHOS 103 06/09/2013 0858   ALKPHOS 111 06/02/2013 1401   AST 21 06/09/2013 0858   AST 34 06/02/2013 1401   ALT 21 06/09/2013 0858   ALT 30 06/02/2013  1401   BILITOT <0.20 Repeated and Verified 06/09/2013 0858   BILITOT 0.2* 06/02/2013 1401       RADIOGRAPHIC STUDIES:  Dg Chest Port 1 View  12/08/2012  *RADIOLOGY REPORT*  Clinical Data: Post line placement.  Rule out pneumothorax.  The  PORTABLE CHEST - 1 VIEW  Comparison: 11/25/2012 PET CT.  10/26/2012 chest x-ray.  Findings: Curvilinear appearance right apex along the undersurface of the posterior aspect of the right second rib probably related to rib rather than pneumothorax.   This can be assessed on follow-up examination if there are any progressive symptoms to suggest pneumothorax.  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  Cardiomegaly.  Mild central pulmonary vascular prominence.  Prior left breast surgery and left axillary lymph node dissection.  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  No definitive pneumothorax.  Curvilinear structure right lung apex probably associated with the undersurface of rib as noted above.  This is a call report.   Original Report Authenticated By: Lacy Duverney, M.D.    Dg C-arm 1-60 Min-no Report  12/08/2012  CLINICAL DATA: port-a-cath insertion   C-ARM 1-60 MINUTES  Fluoroscopy was utilized by the requesting physician.  No radiographic  interpretation.      ASSESSMENT: 47 year old female with   #1stage III a( T3 N1) invasive ductal carcinoma she is status post mastectomy with axillary lymph node dissection with one of 12 lymph nodes positive for metastatic disease. Tumor was ER negative PR negative HER-2/neu negative. She had her mastectomy on 11/03/2012 with axillary lymph node dissection performed on 12/08/2012 and she had a Port-A-Cath placed at that time as well. She should have no evidence of metastatic disease elsewhere distally.  #2 patient has completed 4 cycles of FEC that was given dose dense. She reports see this from 01/10/2013 through March 2014.  #3 she will begin Taxol carboplatinum adjuvantly on a weekly  basis starting on 03/17/2013 for a total of 12 weeks.  #4 patient now with DVTs she is on Coumadin and Lovenox. She is being followed by the Coumadin clinic.  #5 Ingrown toenail-patient was recommended toenail removal.  We will wait until after her chemotherapy is complete if possible.    #6 Iron deficiency, patient required parenteral iron in December, 2013.  We will recheck iron studies next week.    PLAN:  #1 patient will proceed with final cycle of her chemotherapy today.  #2 she'll be  seen back in one week's time for interim lab in followup  All questions were answered. The patient knows to call the clinic with any problems, questions or concerns. We can certainly see the patient much sooner if necessary.  I spent 25 minutes counseling the patient face to face. The total time spent in the appointment was 30 minutes.  Drue Second, MD Medical/Oncology [facility] [phone] (beeper) [phone] (Office)

## 2013-06-26 ENCOUNTER — Ambulatory Visit
Admission: RE | Admit: 2013-06-26 | Discharge: 2013-06-26 | Disposition: A | Discharge status: Home / Self Care | Payer: 59 | Source: Ambulatory Visit | Attending: Radiation Oncology | Admitting: Radiation Oncology

## 2013-06-27 ENCOUNTER — Encounter: Payer: Self-pay | Admitting: Radiation Oncology

## 2013-06-27 ENCOUNTER — Encounter: Payer: Self-pay | Admitting: Pharmacist

## 2013-06-27 ENCOUNTER — Ambulatory Visit
Admission: RE | Admit: 2013-06-27 | Discharge: 2013-06-27 | Disposition: A | Discharge status: Home / Self Care | Payer: 59 | Source: Ambulatory Visit | Attending: Radiation Oncology | Admitting: Radiation Oncology

## 2013-06-27 VITALS — BP 131/83 | HR 101 | Temp 98.9°F | Resp 20 | Wt 210.2 lb

## 2013-06-27 DIAGNOSIS — C50412 Malignant neoplasm of upper-outer quadrant of left female breast: Secondary | ICD-10-CM

## 2013-06-27 NOTE — Progress Notes (Signed)
Weekly Management Note Current Dose: 6 Gy  Projected Dose:46.8 Gy   Narrative:  The patient presents for routine under treatment assessment.  CBCT/MVCT images/Port film x-rays were reviewed.  The chart was checked. Doing well. No complaints. Using radiaplex.  Physical Findings: Weight: 210 lb 3.2 oz (95.346 kg). Unchanged  Impression:  The patient is tolerating radiation.  Plan:  Continue treatment as planned. Continue radiaplex.

## 2013-06-27 NOTE — Progress Notes (Signed)
Provided coumadin samples for pt:  Coumadin 5mg  tablets (x 20 tablets) Lot: [id number], Exp: 3/15 Coumadin 1mg  tablets (x 10 tablets) Lot: [id number], Exp: 01/2014

## 2013-06-27 NOTE — Progress Notes (Signed)
Weekly rad tx, Ltcw,subcalvian, 6/25 completed, hyperpigmentation/tanning, skin intact, no c/o pain, using radiaplex bid  10:22 AM

## 2013-06-28 ENCOUNTER — Ambulatory Visit
Admission: RE | Admit: 2013-06-28 | Discharge: 2013-06-28 | Disposition: A | Discharge status: Home / Self Care | Payer: 59 | Source: Ambulatory Visit | Attending: Radiation Oncology | Admitting: Radiation Oncology

## 2013-06-29 ENCOUNTER — Ambulatory Visit
Admission: RE | Admit: 2013-06-29 | Discharge: 2013-06-29 | Disposition: A | Discharge status: Home / Self Care | Payer: 59 | Source: Ambulatory Visit | Attending: Radiation Oncology | Admitting: Radiation Oncology

## 2013-06-30 ENCOUNTER — Ambulatory Visit
Admission: RE | Admit: 2013-06-30 | Discharge: 2013-06-30 | Disposition: A | Discharge status: Home / Self Care | Payer: 59 | Source: Ambulatory Visit | Attending: Radiation Oncology | Admitting: Radiation Oncology

## 2013-07-03 ENCOUNTER — Ambulatory Visit
Admission: RE | Admit: 2013-07-03 | Discharge: 2013-07-03 | Disposition: A | Discharge status: Home / Self Care | Payer: 59 | Source: Ambulatory Visit | Attending: Radiation Oncology | Admitting: Radiation Oncology

## 2013-07-04 ENCOUNTER — Ambulatory Visit
Admission: RE | Admit: 2013-07-04 | Discharge: 2013-07-04 | Disposition: A | Discharge status: Home / Self Care | Payer: 59 | Source: Ambulatory Visit | Attending: Radiation Oncology | Admitting: Radiation Oncology

## 2013-07-04 ENCOUNTER — Encounter: Payer: Self-pay | Admitting: Radiation Oncology

## 2013-07-04 VITALS — BP 131/84 | HR 97 | Temp 98.7°F | Resp 20 | Wt 209.3 lb

## 2013-07-04 DIAGNOSIS — C50412 Malignant neoplasm of upper-outer quadrant of left female breast: Secondary | ICD-10-CM

## 2013-07-04 NOTE — Progress Notes (Addendum)
Weekly rad txs, 11/25 ltcw/subcalvian, tanning, skin intact, using radiaplex  Bid, left arm/hand and left leg swelling, looks like petechia onc alfs b/l, patient on coumadin protocol,hx DVT. 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

## 2013-07-04 NOTE — Progress Notes (Signed)
Weekly Management Note Current Dose: 19.8  Gy  Projected Dose:60.4  Gy   Narrative:  The patient presents for routine under treatment assessment.  CBCT/MVCT images/Port film x-rays were reviewed.  The chart was checked. Increasing swelling of right arm. Not wearing glove/sleeve due to work and lost instructions on how to perform manual lymph drainage. Would like to see PT again. Otherwise, no skin related complaints.   Physical Findings: Weight: 209 lb 4.8 oz (94.938 kg). Slightly dark right chestwall. Swelling of right arm and fingers.   Impression:  The patient is tolerating radiation.  Plan:  Continue treatment as planned. Refer back to PT for lymphedema treatment.

## 2013-07-05 ENCOUNTER — Ambulatory Visit
Admission: RE | Admit: 2013-07-05 | Discharge: 2013-07-05 | Disposition: A | Discharge status: Home / Self Care | Payer: 59 | Source: Ambulatory Visit | Attending: Radiation Oncology | Admitting: Radiation Oncology

## 2013-07-06 ENCOUNTER — Ambulatory Visit
Admission: RE | Admit: 2013-07-06 | Discharge: 2013-07-06 | Disposition: A | Discharge status: Home / Self Care | Payer: 59 | Source: Ambulatory Visit | Attending: Radiation Oncology | Admitting: Radiation Oncology

## 2013-07-07 ENCOUNTER — Ambulatory Visit
Admission: RE | Admit: 2013-07-07 | Discharge: 2013-07-07 | Disposition: A | Discharge status: Home / Self Care | Payer: 59 | Source: Ambulatory Visit | Attending: Radiation Oncology | Admitting: Radiation Oncology

## 2013-07-10 ENCOUNTER — Ambulatory Visit
Admission: RE | Admit: 2013-07-10 | Discharge: 2013-07-10 | Disposition: A | Discharge status: Home / Self Care | Payer: 59 | Source: Ambulatory Visit | Attending: Radiation Oncology | Admitting: Radiation Oncology

## 2013-07-10 ENCOUNTER — Other Ambulatory Visit (HOSPITAL_BASED_OUTPATIENT_CLINIC_OR_DEPARTMENT_OTHER): Payer: 59

## 2013-07-10 ENCOUNTER — Ambulatory Visit (HOSPITAL_BASED_OUTPATIENT_CLINIC_OR_DEPARTMENT_OTHER): Payer: 59 | Admitting: Pharmacist

## 2013-07-10 DIAGNOSIS — I82402 Acute embolism and thrombosis of unspecified deep veins of left lower extremity: Secondary | ICD-10-CM

## 2013-07-10 DIAGNOSIS — I82409 Acute embolism and thrombosis of unspecified deep veins of unspecified lower extremity: Secondary | ICD-10-CM

## 2013-07-10 LAB — PROTIME-INR: Protime: 26.4 Seconds — ABNORMAL HIGH (ref 10.6–13.4)

## 2013-07-10 LAB — POCT INR: INR: 2.2

## 2013-07-10 NOTE — Progress Notes (Addendum)
INR at goal today, pt has been stable on this dose No issues with bruising or bleeding No missed or extra doses No new medications Pt is doing well and her only complaint is being tired/fatigued from working night shifts. She is here for radiation as well today Plan is to: Continue Coumadin 7mg  daily. Recheck INR in 3 weeks.  07/31/13 9:15 lab and 9:30 coumadin clinic. Followed by radiation at 10am  Coumadin Samples Provided:  2 mg x 20 tabs: LOT: [id number]   Exp: 09/2013  5 mg x 20 tabs: [id number]   Exp: 01/2015

## 2013-07-10 NOTE — Patient Instructions (Addendum)
INR at goal today! Great job!  No changes  Continue Coumadin 7mg  daily. Recheck INR in 3 weeks.    We will see in coumadin clinic.  07/31/13  9:15 lab and 9:30 coumadin clinic.

## 2013-07-11 ENCOUNTER — Encounter: Payer: Self-pay | Admitting: Radiation Oncology

## 2013-07-11 ENCOUNTER — Ambulatory Visit
Admission: RE | Admit: 2013-07-11 | Discharge: 2013-07-11 | Disposition: A | Discharge status: Home / Self Care | Payer: 59 | Source: Ambulatory Visit | Attending: Radiation Oncology | Admitting: Radiation Oncology

## 2013-07-11 VITALS — BP 141/80 | HR 94 | Temp 98.3°F | Resp 20 | Wt 209.9 lb

## 2013-07-11 DIAGNOSIS — C50412 Malignant neoplasm of upper-outer quadrant of left female breast: Secondary | ICD-10-CM

## 2013-07-11 NOTE — Progress Notes (Signed)
[facility]    Radiation Oncology [address] [place]     Maryln Gottron, M.D. [place], [place] [address]               Billie Lade, M.D., Ph.D. Phone: [phone]      Molli Hazard A. Kathrynn Running, M.D. Fax: [phone]      Radene Gunning, M.D., Ph.D.         Lurline Hare, M.D.         Grayland Jack, M.D Weekly Treatment Management Note  Name: Hannah Hale     MRN: [id number]        CSN: [id number] Date: 07/11/2013      DOB: 01/27/1966  CC: Provider Not In System         Welton Flakes    Status: Outpatient  Diagnosis: The encounter diagnosis was Cancer of upper-outer quadrant of female breast, left.  Current Dose: 28.8 Gy  Current Fraction: 16  Planned Dose: 60.4 Gy  Narrative: Hannah Hale was seen today for weekly treatment management. The chart was checked and port films  were reviewed. She is tolerating her treatments well at this time. She denies any significant fatigue, itching or discomfort along the left chest wall area.  Review of patient's allergies indicates no known allergies. Current Outpatient Prescriptions  Medication Sig Dispense Refill  . clindamycin (CLEOCIN-T) 1 % lotion Apply topically 2 (two) times daily.  60 mL  0  . dexamethasone (DECADRON) 4 MG tablet       . diphenhydrAMINE (SOMINEX) 25 MG tablet Take 50 mg by mouth 2 (two) times daily as needed. For itching      . gabapentin (NEURONTIN) 100 MG capsule Take 1 capsule (100 mg total) by mouth 3 (three) times daily.  90 capsule  3  . lidocaine-prilocaine (EMLA) cream Apply topically as needed.  30 g  6  . LORazepam (ATIVAN) 0.5 MG tablet Take 1 tablet (0.5 mg total) by mouth every 6 (six) hours as needed (for nausea or vomitting).  30 tablet  0  . non-metallic deodorant (ALRA) MISC Apply 1 application topically daily as needed.      . ondansetron (ZOFRAN) 8 MG tablet       . prochlorperazine (COMPAZINE) 10 MG tablet Take 1 tablet (10 mg total) by mouth every 6 (six) hours as needed.  30  tablet  1  . prochlorperazine (COMPAZINE) 25 MG suppository       . SUPER B COMPLEX/C CAPS Take 1 tablet by mouth daily.  30 capsule  2  . warfarin (COUMADIN) 5 MG tablet Take 1-2 tablets by mouth daily as directed.  60 tablet  1  . Wound Cleansers (RADIAPLEX EX) Apply topically.       No current facility-administered medications for this encounter.   Labs:  Lab Results  Component Value Date   WBC 5.0 06/09/2013   HGB 8.4* 06/09/2013   HCT 26.3* 06/09/2013   MCV 95.6 06/09/2013   PLT 158 06/09/2013   Lab Results  Component Value Date   CREATININE 0.6 06/09/2013   BUN 5.9* 06/09/2013   NA 143 06/09/2013   K 3.4* 06/09/2013   CL 105 06/02/2013   CO2 23 06/09/2013   Lab Results  Component Value Date   ALT 21 06/09/2013   AST 21 06/09/2013   BILITOT <0.20 Repeated and Verified 06/09/2013    Physical Examination:  weight is 209 lb 14.4 oz (95.21 kg). Her oral temperature is 98.3 F (36.8 C). Her  blood pressure is 141/80 and her pulse is 94. Her respiration is 20.    Wt Readings from Last 3 Encounters:  07/11/13 209 lb 14.4 oz (95.21 kg)  07/04/13 209 lb 4.8 oz (94.938 kg)  06/27/13 210 lb 3.2 oz (95.346 kg)    The left chest wall area shows hyperpigmentation changes without any skin breakdown. Lungs - Normal respiratory effort, chest expands symmetrically. Lungs are clear to auscultation, no crackles or wheezes.  Heart has regular rhythm and rate  Abdomen is soft and non tender with normal bowel sounds  Assessment:  Patient tolerating treatments well  Plan: Continue treatment per original radiation prescription

## 2013-07-11 NOTE — Progress Notes (Addendum)
Weekly rad txcs, 16 completed so far, LTCW;,hyperpigmentation, erythema , skin intact, looks like it wil start to peel soon, fatigued some, works 2nd shift  Did a double Sunday,swelling left arm,lymphedema clinic scheduled 07/19/13,encouraged to elvate arm on pillow when she sleeps, on coumadin protocol, labs yesterday, PT?INR, wears sleeve and glove nwhen not working 10:14 AM

## 2013-07-12 ENCOUNTER — Ambulatory Visit
Admission: RE | Admit: 2013-07-12 | Discharge: 2013-07-12 | Disposition: A | Discharge status: Home / Self Care | Payer: 59 | Source: Ambulatory Visit | Attending: Radiation Oncology | Admitting: Radiation Oncology

## 2013-07-13 ENCOUNTER — Ambulatory Visit
Admission: RE | Admit: 2013-07-13 | Discharge: 2013-07-13 | Disposition: A | Discharge status: Home / Self Care | Payer: 59 | Source: Ambulatory Visit | Attending: Radiation Oncology | Admitting: Radiation Oncology

## 2013-07-14 ENCOUNTER — Ambulatory Visit
Admission: RE | Admit: 2013-07-14 | Discharge: 2013-07-14 | Disposition: A | Discharge status: Home / Self Care | Payer: 59 | Source: Ambulatory Visit | Attending: Radiation Oncology | Admitting: Radiation Oncology

## 2013-07-14 DIAGNOSIS — C50419 Malignant neoplasm of upper-outer quadrant of unspecified female breast: Secondary | ICD-10-CM

## 2013-07-14 MED ORDER — RADIAPLEXRX EX GEL
Freq: Once | CUTANEOUS | Status: AC
  Administered 2013-07-14: 15:00:00 via TOPICAL

## 2013-07-17 ENCOUNTER — Ambulatory Visit
Admission: RE | Admit: 2013-07-17 | Discharge: 2013-07-17 | Disposition: A | Discharge status: Home / Self Care | Payer: 59 | Source: Ambulatory Visit | Attending: Radiation Oncology | Admitting: Radiation Oncology

## 2013-07-18 ENCOUNTER — Encounter: Payer: Self-pay | Admitting: Radiation Oncology

## 2013-07-18 ENCOUNTER — Ambulatory Visit
Admission: RE | Admit: 2013-07-18 | Discharge: 2013-07-18 | Disposition: A | Discharge status: Home / Self Care | Payer: 59 | Source: Ambulatory Visit | Attending: Radiation Oncology | Admitting: Radiation Oncology

## 2013-07-18 VITALS — BP 140/100 | HR 97 | Temp 98.5°F | Resp 20 | Wt 212.1 lb

## 2013-07-18 DIAGNOSIS — C50412 Malignant neoplasm of upper-outer quadrant of left female breast: Secondary | ICD-10-CM

## 2013-07-18 NOTE — Progress Notes (Signed)
Weekly Management Note Current Dose: 37.8  Gy  Projected Dose: 60.4 Gy   Narrative:  The patiet presents for routine under treatment assessment.  CBCT/MVCT images/Port film x-rays were reviewed.  The chart was checked. Doing well. No itching. Tired. Using radiaplex. Bid. Rash on legs is stable.   Physical Findings: Weight: 212 lb 1.6 oz (96.208 kg) Dry desquamation over left chest wall. No moist desquamation.   Impression:  The patient is tolerating radiation.  Plan:  Continue treatment as planned. Continue radiaplex. Reassess skin on Friday.

## 2013-07-18 NOTE — Progress Notes (Signed)
Weekly rad tx LCW  21 tx completed,dark  Hyperpigmentation, skin untact, radiaplex gel 2-3 x day, occasional sharp pain  In Left chesta area, appetite good, slight fatigue, no c/o itching, no nausea, stopped working double shifts 10:16 AM

## 2013-07-19 ENCOUNTER — Ambulatory Visit: Payer: 59 | Attending: Radiation Oncology | Admitting: Physical Therapy

## 2013-07-19 ENCOUNTER — Ambulatory Visit
Admission: RE | Admit: 2013-07-19 | Discharge: 2013-07-19 | Disposition: A | Discharge status: Home / Self Care | Payer: 59 | Source: Ambulatory Visit | Attending: Radiation Oncology | Admitting: Radiation Oncology

## 2013-07-19 DIAGNOSIS — C50919 Malignant neoplasm of unspecified site of unspecified female breast: Secondary | ICD-10-CM | POA: Insufficient documentation

## 2013-07-19 DIAGNOSIS — IMO0001 Reserved for inherently not codable concepts without codable children: Secondary | ICD-10-CM | POA: Insufficient documentation

## 2013-07-19 DIAGNOSIS — I890 Lymphedema, not elsewhere classified: Secondary | ICD-10-CM | POA: Insufficient documentation

## 2013-07-20 ENCOUNTER — Encounter: Payer: Self-pay | Admitting: Pharmacist

## 2013-07-20 ENCOUNTER — Ambulatory Visit
Admission: RE | Admit: 2013-07-20 | Discharge: 2013-07-20 | Disposition: A | Discharge status: Home / Self Care | Payer: 59 | Source: Ambulatory Visit | Attending: Radiation Oncology | Admitting: Radiation Oncology

## 2013-07-20 NOTE — Progress Notes (Signed)
Pt stopped by after radiation for coumadin samples  5mg  #30 Lot [id number] EXP 2/16 2mg  #30 Lot [id number]  EXP 10/14

## 2013-07-21 ENCOUNTER — Ambulatory Visit
Admission: RE | Admit: 2013-07-21 | Discharge: 2013-07-21 | Disposition: A | Discharge status: Home / Self Care | Payer: 59 | Source: Ambulatory Visit | Attending: Radiation Oncology | Admitting: Radiation Oncology

## 2013-07-21 ENCOUNTER — Encounter: Payer: Self-pay | Admitting: Radiation Oncology

## 2013-07-21 VITALS — BP 150/81 | HR 102 | Temp 98.6°F | Resp 20 | Wt 209.9 lb

## 2013-07-21 DIAGNOSIS — C50412 Malignant neoplasm of upper-outer quadrant of left female breast: Secondary | ICD-10-CM

## 2013-07-21 NOTE — Progress Notes (Signed)
Weekly Management Note Current Dose:   43.2 Gy  Projected Dose: 60.4 Gy   Narrative:  The patient presents for routine under treatment assessment.  CBCT/MVCT images/Port film x-rays were reviewed.  The chart was checked. Her skin really has not improved. It got much worse but continues to have dry desquamation. She is ready to be done with treatment. She's worried about skin breakdown.  Physical Findings: Weight: 209 lb 14.4 oz (95.21 kg). She has significant dry desquamation over the left chest wall. There is no moist desquamation. She has a small area of dry desquamation over the clavicular head.  Impression:  The patient is tolerating radiation.  Plan:  I think we'll stop her tangent fields for now. She's reached a good skin reaction and dry desquamation and I fear continuing this course of treatment to her prescribed dose will result in moist desquamation with not much clinical benefit. We'll continue and finish her supraclavicular fields to 45 gray and begin her scar boost next week. She is in agreement with this plan.

## 2013-07-21 NOTE — Progress Notes (Signed)
Name: Hydeia Mcatee   MRN: [id number]  Date:  07/21/2013   DOB: June 25, 1966  Status:outpatient    DIAGNOSIS: Breast cancer.  CONSENT VERIFIED: yes   SET UP: Patient is setup supine   IMMOBILIZATION:  The following immobilization was used:Custom Moldable Pillow, breast board.   NARRATIVE: Mitzi Hansen underwent complex simulation and treatment planning for her boost treatment today.  Her scar will be outlined clinically with margin on the treatment machine. A 25 cm cone was selected.    6 MeV electrons will be prescribed to the  100% isodose line. 0.5 cm bolus will be placed daily.   A block will be used for beam modification purposes.  A special port plan is requested.

## 2013-07-21 NOTE — Progress Notes (Signed)
Weekly rad txs, 24 completed left chest wall, dark hyperpigmentation, dry desquamation, skin intact, c/o still burns and sore, using raidaplex gel bid, FYYN fler given to patient, if completes today or Monday , continue radiaplex a couple weeks then cn change to a lotion with vitamin E, good appetite, no fati8gue,nausea 10:24 AM '

## 2013-07-24 ENCOUNTER — Ambulatory Visit
Admission: RE | Admit: 2013-07-24 | Discharge: 2013-07-24 | Disposition: A | Discharge status: Home / Self Care | Payer: 59 | Source: Ambulatory Visit | Attending: Radiation Oncology | Admitting: Radiation Oncology

## 2013-07-24 DIAGNOSIS — C50412 Malignant neoplasm of upper-outer quadrant of left female breast: Secondary | ICD-10-CM

## 2013-07-24 MED ORDER — RADIAPLEXRX EX GEL
Freq: Once | CUTANEOUS | Status: AC
  Administered 2013-07-24: 10:00:00 via TOPICAL

## 2013-07-25 ENCOUNTER — Ambulatory Visit
Admission: RE | Admit: 2013-07-25 | Discharge: 2013-07-25 | Disposition: A | Discharge status: Home / Self Care | Payer: 59 | Source: Ambulatory Visit | Attending: Radiation Oncology | Admitting: Radiation Oncology

## 2013-07-25 ENCOUNTER — Ambulatory Visit: Payer: 59

## 2013-07-25 DIAGNOSIS — C50412 Malignant neoplasm of upper-outer quadrant of left female breast: Secondary | ICD-10-CM

## 2013-07-25 MED ORDER — BIAFINE EX EMUL
CUTANEOUS | Status: DC | PRN
  Administered 2013-07-25: 11:00:00 via TOPICAL

## 2013-07-25 NOTE — Progress Notes (Signed)
Patient seen in treatment areq by MD, gave biafine cream and hydrogel pads to patient with instructions of use of both by Alvina Chou ,RN who saw patient when she came to nursing after treatment

## 2013-07-25 NOTE — Progress Notes (Signed)
Weekly Management Note Current Dose: 45.2  Gy  Projected Dose:53.2  Gy   Narrative:  The patient presents for routine under treatment assessment.  CBCT/MVCT images/Port film x-rays were reviewed.  The chart was checked. Discomfort over chest wall continues. Skin is irritated.   Physical Findings: Dark chest wall. Moist desquamation inferior to scar.   Impression:  The patient is tolerating radiation.  Plan:  Continue treatment as planned. D/c tangents early. Deliver only 4 boost fractions to complete on Friday. Change to biafene and hydrogel pads.

## 2013-07-26 ENCOUNTER — Ambulatory Visit
Admission: RE | Admit: 2013-07-26 | Discharge: 2013-07-26 | Disposition: A | Discharge status: Home / Self Care | Payer: 59 | Source: Ambulatory Visit | Attending: Radiation Oncology | Admitting: Radiation Oncology

## 2013-07-27 ENCOUNTER — Ambulatory Visit
Admission: RE | Admit: 2013-07-27 | Discharge: 2013-07-27 | Disposition: A | Discharge status: Home / Self Care | Payer: 59 | Source: Ambulatory Visit | Attending: Radiation Oncology | Admitting: Radiation Oncology

## 2013-07-28 ENCOUNTER — Ambulatory Visit: Payer: 59

## 2013-07-28 ENCOUNTER — Ambulatory Visit
Admission: RE | Admit: 2013-07-28 | Discharge: 2013-07-28 | Disposition: A | Discharge status: Home / Self Care | Payer: 59 | Source: Ambulatory Visit | Attending: Radiation Oncology | Admitting: Radiation Oncology

## 2013-07-31 ENCOUNTER — Ambulatory Visit
Admission: RE | Admit: 2013-07-31 | Discharge: 2013-07-31 | Disposition: A | Discharge status: Home / Self Care | Payer: 59 | Source: Ambulatory Visit | Attending: Radiation Oncology | Admitting: Radiation Oncology

## 2013-07-31 ENCOUNTER — Encounter: Payer: Self-pay | Admitting: Radiation Oncology

## 2013-07-31 ENCOUNTER — Ambulatory Visit: Payer: 59 | Admitting: Pharmacist

## 2013-07-31 ENCOUNTER — Other Ambulatory Visit (HOSPITAL_BASED_OUTPATIENT_CLINIC_OR_DEPARTMENT_OTHER): Payer: 59 | Admitting: Lab

## 2013-07-31 DIAGNOSIS — I82409 Acute embolism and thrombosis of unspecified deep veins of unspecified lower extremity: Secondary | ICD-10-CM

## 2013-07-31 DIAGNOSIS — I82402 Acute embolism and thrombosis of unspecified deep veins of left lower extremity: Secondary | ICD-10-CM

## 2013-07-31 LAB — PROTIME-INR: Protime: 22.8 Seconds — ABNORMAL HIGH (ref 10.6–13.4)

## 2013-07-31 NOTE — Patient Instructions (Signed)
Continue Coumadin 7mg  daily.  Recheck INR in 3 weeks on 08/23/13; lab at 10am and coumadin clinic at 10:15am.

## 2013-07-31 NOTE — Progress Notes (Addendum)
INR very near goal today. No problems to report regarding anticoagulation. No s/s of clotting. No changes in medications. Pt missed one coumadin dose on 07/29/13. Pt also had cabbage on 07/29/13 (she doesn't usually eat cabbage). Pt completed her XRT today!! INR likely lower today secondary to missed coumadin dose on Saturday. Will continue Coumadin 7mg  daily. Pt has been stable on this dose. Recheck INR in 3 weeks on 08/23/13; lab at 10am and coumadin clinic at 10:15am. Will need to f/u length of anticoagulation therapy with MD at this time.  Anticoagulation started 02/27/13 for DVT of the left lower extremity coursing from the posterior tibial vein through the popliteal vein. No charge encounter.  Coumadin 1mg  tablets provided: (x 20 tablets) Exp: 10/15, Lot: [id number]

## 2013-08-01 ENCOUNTER — Ambulatory Visit: Payer: 59

## 2013-08-01 NOTE — Progress Notes (Signed)
  Radiation Oncology         [phone]) [phone] ________________________________  Name: Hannah Hale MRN: [id number]  Date: 07/31/2013  DOB: Jun 15, 1966  End of Treatment Note  Diagnosis:   T3N1 Left breast cancer s/p mastectomy     Indication for treatment:  Curative       Radiation treatment dates:  06/20/2013-07/31/2013  Site/dose:    Left chest wall / 43.2 Gray @ 1.8 Wallace Cullens per fraction x 28 fractions Left Supraclavicular fossa / 45 Gray @1 .8 Wallace Cullens per fraction x 25 fractions Left scar / 10 Gray at [employer] per fraction x 5 fractions  Beams/energy:  Opposed Tangents / 6 MV photons Right anterior oblique / 6 and 10 MV photons En face / 6 MeV electrons  Narrative: The patient tolerated radiation treatment relatively well.   She had an exaggerated skin reaction for which I stopped her tangent fields early and just completed her scar treatments. She was treated with Radiaplex.  Plan: The patient has completed radiation treatment. The patient will return to radiation oncology clinic for routine followup in one month. I advised them to call or return sooner if they have any questions or concerns related to their recovery or treatment. She has regular scheduled followup with oncology.   ------------------------------------------------  Lurline Hare, MD

## 2013-08-02 ENCOUNTER — Ambulatory Visit: Payer: 59

## 2013-08-03 ENCOUNTER — Ambulatory Visit: Payer: 59

## 2013-08-04 ENCOUNTER — Ambulatory Visit: Payer: 59

## 2013-08-08 ENCOUNTER — Ambulatory Visit: Payer: 59

## 2013-08-08 NOTE — Progress Notes (Signed)
Vitals documented on 05/13/2013 were incorrectly documented on wrong patient.

## 2013-08-11 ENCOUNTER — Ambulatory Visit: Payer: 59

## 2013-08-11 ENCOUNTER — Other Ambulatory Visit (HOSPITAL_BASED_OUTPATIENT_CLINIC_OR_DEPARTMENT_OTHER): Payer: 59 | Admitting: Lab

## 2013-08-11 ENCOUNTER — Ambulatory Visit (HOSPITAL_BASED_OUTPATIENT_CLINIC_OR_DEPARTMENT_OTHER): Payer: 59 | Admitting: Adult Health

## 2013-08-11 ENCOUNTER — Telehealth: Payer: Self-pay | Admitting: Oncology

## 2013-08-11 ENCOUNTER — Encounter: Payer: Self-pay | Admitting: Adult Health

## 2013-08-11 DIAGNOSIS — C50412 Malignant neoplasm of upper-outer quadrant of left female breast: Secondary | ICD-10-CM

## 2013-08-11 DIAGNOSIS — C50419 Malignant neoplasm of upper-outer quadrant of unspecified female breast: Secondary | ICD-10-CM

## 2013-08-11 DIAGNOSIS — D509 Iron deficiency anemia, unspecified: Secondary | ICD-10-CM

## 2013-08-11 DIAGNOSIS — G589 Mononeuropathy, unspecified: Secondary | ICD-10-CM

## 2013-08-11 DIAGNOSIS — I82409 Acute embolism and thrombosis of unspecified deep veins of unspecified lower extremity: Secondary | ICD-10-CM

## 2013-08-11 DIAGNOSIS — D649 Anemia, unspecified: Secondary | ICD-10-CM

## 2013-08-11 DIAGNOSIS — Z7901 Long term (current) use of anticoagulants: Secondary | ICD-10-CM

## 2013-08-11 DIAGNOSIS — G629 Polyneuropathy, unspecified: Secondary | ICD-10-CM

## 2013-08-11 DIAGNOSIS — I82402 Acute embolism and thrombosis of unspecified deep veins of left lower extremity: Secondary | ICD-10-CM

## 2013-08-11 LAB — CBC WITH DIFFERENTIAL/PLATELET
Eosinophils Absolute: 0.2 10*3/uL (ref 0.0–0.5)
MONO#: 0.6 10*3/uL (ref 0.1–0.9)
NEUT#: 6.2 10*3/uL (ref 1.5–6.5)
RBC: 3.69 10*6/uL — ABNORMAL LOW (ref 3.70–5.45)
RDW: 22.9 % — ABNORMAL HIGH (ref 11.2–14.5)
WBC: 8.2 10*3/uL (ref 3.9–10.3)

## 2013-08-11 LAB — IRON AND TIBC CHCC: Iron: 25 ug/dL — ABNORMAL LOW (ref 41–142)

## 2013-08-11 LAB — COMPREHENSIVE METABOLIC PANEL (CC13)
Albumin: 3.2 g/dL — ABNORMAL LOW (ref 3.5–5.0)
CO2: 23 mEq/L (ref 22–29)
Glucose: 125 mg/dl (ref 70–140)
Potassium: 3.8 mEq/L (ref 3.5–5.1)
Sodium: 142 mEq/L (ref 136–145)
Total Protein: 7.7 g/dL (ref 6.4–8.3)

## 2013-08-11 LAB — PROTIME-INR
INR: 2.3 (ref 2.00–3.50)
Protime: 27.6 Seconds — ABNORMAL HIGH (ref 10.6–13.4)

## 2013-08-11 LAB — FERRITIN CHCC: Ferritin: 18 ng/ml (ref 9–269)

## 2013-08-11 MED ORDER — GABAPENTIN 100 MG PO CAPS: 300.0000 mg | ORAL_CAPSULE | Freq: Three times a day (TID) | ORAL | Status: DC

## 2013-08-11 NOTE — Telephone Encounter (Signed)
, °

## 2013-08-11 NOTE — Patient Instructions (Addendum)
Gabapentin-Take 2 tablets three times a day for three days, then increase to 3 tablets three times a day for three days.  We will see you back in 3 months.

## 2013-08-11 NOTE — Progress Notes (Signed)
OFFICE PROGRESS NOTE  CC Dr. Ovidio Kin Dr. 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 Pierce Crane Dr. Ovidio Kin and Dr. Lurline Hare.Patient originally felt lumpiness in her left breast. This prompted a mammogram.Her mammogram was performed on 08/25/2012 showed pleomorphic calcifications of the entire upper outer left breast measuring 11 x 12 x 15 cm. On exam she had a for a multinodular mass like area over the left upper quadrant. Ultrasound confirmed multiple ill-defined hypoechoic areas. Ultrasound of the left axilla showed a few abnormal appearing lymph nodes. Biopsy of the lymph nodes and the breast was performed on 09/01/2012. 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 The ER was 2% PR 0%.   #2Patient was recommended A mastectomy. 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 Lymphovascular invasion was identified all resection margins were negative the 2 sentinel nodes were removed one was positive for metastatic disease. Patient then went on to have a full axillary lymph node dissection performed the pathology showed 10 lymph nodes negative for metastatic disease therefore 1 of 12 lymph nodes were positive. Final pathologic staging was T T3 N1. Tumor was ER negative PR negative HER-2/neu negative.  #3 patient did have genetic counseling performed but she declined genetic testing at that time. We discussed genetic counseling and testing again in patient is now agreeable and we will get this taken care of.  #4 patient was seen by  Dr. Pierce Crane on 11/17/2012 he recommended PET scan which was negative for metastatic disease. He also recommended adjuvant chemotherapy consisting of FEC given every 2 weeks for a total of 4 cycles. She has now completed the [facility] as of end of March 2014.  She also had iron deficiency and received Feraheme in December.    #5 on 03/17/2013 patient will begin adjuvant Taxol carboplatinum weekly for a total of 12 weeks.  This completed on 06/02/13.  #5 patient with left lower extremity DVT she is on Coumadin.  #6 Radiation therapy from 06/20/13-07/31/13.  CURRENT THERAPY: observation  INTERVAL HISTORY: Else Habermann 47 y.o. female returns for followup visit today after completing radiation therapy 07/31/13.  The neuropathy is very bothersome.  She experiences it in her fingertips and the soles of her feet.  She states it is much worse since completing chemotherapy as compared to when she was actively receiving treatment.  She is walking at work today and her feet feel like bricks.  Her skin is beginning to heal from radiation.  Otherwise, a 10 point ROS is negative.   MEDICAL HISTORY: Past Medical History  Diagnosis Date  . Coughing     cold recent   . Breast cancer     left, Sept 20 2013  . GERD (gastroesophageal reflux disease)   . Fibroids     uterine    ALLERGIES:  has No Known Allergies.  MEDICATIONS:  Current Outpatient Prescriptions  Medication Sig Dispense Refill  . diphenhydrAMINE (SOMINEX) 25 MG tablet Take 50 mg by mouth 2 (two) times daily as needed. For itching      . emollient (BIAFINE) cream  Apply 1 application topically daily. Apply after rad tx and bedtime      . gabapentin (NEURONTIN) 100 MG capsule Take 1 capsule (100 mg total) by mouth 3 (three) times daily.  90 capsule  3  . hyaluronate sodium (RADIAPLEXRX) GEL Apply 1 application topically 2 (two) times daily. 2nd tube      . non-metallic deodorant (ALRA) MISC Apply 1 application topically daily as needed.      .  SUPER B COMPLEX/C CAPS Take 1 tablet by mouth daily.  30 capsule  2  . warfarin (COUMADIN) 5 MG tablet Take 1-2 tablets by mouth daily as directed.  60 tablet  1   No current facility-administered medications for this visit.    SURGICAL HISTORY:  Past Surgical History  Procedure Laterality Date  . Simple mastectomy w/ sentinel node biopsy  11/03/2012  . Simple mastectomy with axillary sentinel node biopsy  11/03/2012    Procedure: SIMPLE MASTECTOMY WITH AXILLARY SENTINEL NODE BIOPSY;  Surgeon: Kandis Cocking, MD;  Location: [facility] OR;  Service: General;  Laterality: Left;  . Portacath placement  12/08/2012    Procedure: INSERTION PORT-A-CATH;  Surgeon: Kandis Cocking, MD;  Location: [facility] ORS;  Service: General;  Laterality: N/A;  Power Port Placment and Left Axillary Node Dissection  . Axillary lymph node dissection  12/08/2012    Procedure: AXILLARY LYMPH NODE DISSECTION;  Surgeon: Kandis Cocking, MD;  Location: [facility] ORS;  Service: General;  Laterality: Left;    REVIEW OF SYSTEMS:  General: fatigue (+), night sweats (-), fever (-), pain (-) Lymph: palpable nodes (-) HEENT: vision changes (-), mucositis (-), gum bleeding (-), epistaxis (-) Cardiovascular: chest pain (-), palpitations (-) Pulmonary: shortness of breath (-), dyspnea on exertion (-), cough (-), hemoptysis (-) GI:  Early satiety (-), melena (-), dysphagia (-), nausea/vomiting (-), diarrhea (-) GU: dysuria (-), hematuria (-), incontinence (-) Musculoskeletal: joint swelling (-), joint pain (-), back pain (-) Neuro: weakness (-), numbness (+), headache (-), confusion (-) Skin: Rash (-), lesions (-), dryness (-) Psych: depression (-), suicidal/homicidal ideation (-), feeling of hopelessness (-)  PHYSICAL EXAMINATION: There were no vitals taken for this visit. There is no weight on file to calculate BMI. General: Patient is a well appearing female in no acute distress HEENT: PERRLA, sclerae anicteric no conjunctival pallor,  MMM Neck: supple, no palpable adenopathy Lungs: clear to auscultation bilaterally, no wheezes, rhonchi, or rales Cardiovascular: regular rate rhythm, S1, S2, no murmurs, rubs or gallops,  Abdomen: Soft, non-tender, non-distended, normoactive bowel sounds, no HSM Extremities: warm and well perfused, no clubbing, cyanosis, or edema, swelling in left upper extremity  Skin: No rashes or lesions  Neuro: Non-focal Breasts: left mastectomy site without nodularity, well healed, right breast no masses/nodularity ECOG PERFORMANCE STATUS: 0 - Asymptomatic   LABORATORY DATA: Lab Results  Component Value Date   WBC 5.0 06/09/2013   HGB 8.4* 06/09/2013   HCT 26.3* 06/09/2013   MCV 95.6 06/09/2013   PLT 158 06/09/2013      Chemistry      Component Value Date/Time   NA 143 06/09/2013 0858   NA 135 06/02/2013 1401   K 3.4* 06/09/2013 0858   K 3.6 06/02/2013 1401   CL 105 06/02/2013 1401   CL 105 05/26/2013 0828   CO2 23 06/09/2013 0858   CO2 25 06/02/2013 1401   BUN 5.9* 06/09/2013 0858   BUN 11 06/02/2013 1401   CREATININE 0.6 06/09/2013 0858   CREATININE 0.56 06/02/2013 1401  Component Value Date/Time   CALCIUM 9.1 06/09/2013 0858   CALCIUM 9.0 06/02/2013 1401   ALKPHOS 103 06/09/2013 0858   ALKPHOS 111 06/02/2013 1401   AST 21 06/09/2013 0858   AST 34 06/02/2013 1401   ALT 21 06/09/2013 0858   ALT 30 06/02/2013 1401   BILITOT <0.20 Repeated and Verified 06/09/2013 0858   BILITOT 0.2* 06/02/2013 1401       RADIOGRAPHIC STUDIES:  Dg Chest Port 1 View  12/08/2012  *RADIOLOGY REPORT*  Clinical Data: Post line placement.  Rule out pneumothorax.  The  PORTABLE CHEST - 1 VIEW  Comparison: 11/25/2012 PET CT.  10/26/2012 chest x-ray.  Findings: Curvilinear appearance right apex along the undersurface of the posterior aspect of the right second rib probably related to rib rather than pneumothorax.   This can be assessed on follow-up examination if there are any progressive symptoms to suggest pneumothorax.   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  Cardiomegaly.  Mild central pulmonary vascular prominence.  Prior left breast surgery and left axillary lymph node dissection.  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  No definitive pneumothorax.  Curvilinear structure right lung apex probably associated with the undersurface of rib as noted above.  This is a call report.   Original Report Authenticated By: Lacy Duverney, M.D.    Dg C-arm 1-60 Min-no Report  12/08/2012  CLINICAL DATA: port-a-cath insertion   C-ARM 1-60 MINUTES  Fluoroscopy was utilized by the requesting physician.  No radiographic  interpretation.      ASSESSMENT: 47 year old female with   #1stage III a( T3 N1) invasive ductal carcinoma she is status post mastectomy with axillary lymph node dissection with one of 12 lymph nodes positive for metastatic disease. Tumor was ER negative PR negative HER-2/neu negative. She had her mastectomy on 11/03/2012 with axillary lymph node dissection performed on 12/08/2012 and she had a Port-A-Cath placed at that time as well. She should have no evidence of metastatic disease elsewhere distally.  #2 patient has completed 4 cycles of FEC that was given dose dense. She received this from 01/10/2013 through March 2014.  #3 she underwent Taxol carboplatinum adjuvantly on a weekly basis starting on 03/17/2013 for a total of 12 weeks.  #4 patient also has DVTs she is on Coumadin and Lovenox. She is being followed by the Coumadin clinic.  #5 Ingrown toenail-patient was recommended toenail removal.  She has yet to have this done.     #6 Iron deficiency, patient required parenteral iron in December, 2013. We will recheck iron studies in 2 months.  #7 Patient underwent adjuvant radiation therapy from 06/20/13 through 07/31/13.   PLAN:  #1 Doing well. Her main complaint is neuropathy.  We will titrate up her Neurontin to 300 mg tid.    #2 She will continue to take her coumadin  under the care of our Coumadin clinic.         #3 We will see Ms. Sones back in 3 months.  I ordered a repeat mammo in September of her right breast.    All questions were answered. The patient knows to call the clinic with any problems, questions or concerns. We can certainly see the patient much sooner if necessary.  I spent 25 minutes counseling the patient face to face. The total time spent in the appointment was 30 minutes.  Cherie Ouch Lyn Hollingshead, NP Medical Oncology [facility] Phone: [phone]

## 2013-08-23 ENCOUNTER — Ambulatory Visit: Payer: 59 | Admitting: Pharmacist

## 2013-08-23 ENCOUNTER — Other Ambulatory Visit (HOSPITAL_BASED_OUTPATIENT_CLINIC_OR_DEPARTMENT_OTHER): Payer: 59 | Admitting: Lab

## 2013-08-23 DIAGNOSIS — I82409 Acute embolism and thrombosis of unspecified deep veins of unspecified lower extremity: Secondary | ICD-10-CM

## 2013-08-23 DIAGNOSIS — I82402 Acute embolism and thrombosis of unspecified deep veins of left lower extremity: Secondary | ICD-10-CM

## 2013-08-23 LAB — POCT INR: INR: 2.1

## 2013-08-23 LAB — PROTIME-INR: Protime: 25.2 Seconds — ABNORMAL HIGH (ref 10.6–13.4)

## 2013-08-23 NOTE — Progress Notes (Addendum)
INR = 2.1 on Coumadin 7 mg daily Forgot 1 dose yesterday.  Ate a salad yesterday (iceburg lettuce; low in vit K) No recent med changes. INR therapeutic.  No change to dose of Coumadin. Pt is asking how long she needs to stay on Coumadin.  She is now at the 6 months mark of being on Coumadin.  I spoke w/ Hannah Hale over the phone & she states she will talk w/ Hannah Hale & plan to stop the Coumadin in December when Hannah Hale is in to see Hannah Hale for office visit (complete 9 months).  I informed pt of this plan. Repeat protime in 1 month. Hannah Hale, Pharm.D., CPP 08/23/2013@11 :14 AM    Samples: Coumadin 2 mg x 30 tabs (lot #[id number]; exp 09/2013) Coumadin 5 mg x 30 tabs (lot #[id number]; exp 01/2015)

## 2013-09-12 ENCOUNTER — Ambulatory Visit
Admission: RE | Admit: 2013-09-12 | Discharge: 2013-09-12 | Disposition: A | Discharge status: Home / Self Care | Payer: 59 | Source: Ambulatory Visit | Attending: Adult Health | Admitting: Adult Health

## 2013-09-12 DIAGNOSIS — C50412 Malignant neoplasm of upper-outer quadrant of left female breast: Secondary | ICD-10-CM

## 2013-09-20 ENCOUNTER — Other Ambulatory Visit (HOSPITAL_BASED_OUTPATIENT_CLINIC_OR_DEPARTMENT_OTHER): Payer: 59 | Admitting: Lab

## 2013-09-20 ENCOUNTER — Ambulatory Visit (HOSPITAL_BASED_OUTPATIENT_CLINIC_OR_DEPARTMENT_OTHER): Payer: 59 | Admitting: Pharmacist

## 2013-09-20 ENCOUNTER — Encounter (INDEPENDENT_AMBULATORY_CARE_PROVIDER_SITE_OTHER): Payer: Self-pay

## 2013-09-20 DIAGNOSIS — I82409 Acute embolism and thrombosis of unspecified deep veins of unspecified lower extremity: Secondary | ICD-10-CM

## 2013-09-20 DIAGNOSIS — I82402 Acute embolism and thrombosis of unspecified deep veins of left lower extremity: Secondary | ICD-10-CM

## 2013-09-20 LAB — PROTIME-INR: Protime: 40.8 Seconds — ABNORMAL HIGH (ref 10.6–13.4)

## 2013-09-20 LAB — POCT INR: INR: 3.4

## 2013-09-20 NOTE — Progress Notes (Signed)
INR slightly above goal of 2-3.  No medication changes or bleeding/bruising.  Will continue current coumadin dose of 7mg  daily, which she has been on since June 2014.  Hannah Hale will return to coumadin clinic in 1 month.

## 2013-10-20 ENCOUNTER — Ambulatory Visit (HOSPITAL_BASED_OUTPATIENT_CLINIC_OR_DEPARTMENT_OTHER): Payer: 59 | Admitting: Pharmacist

## 2013-10-20 ENCOUNTER — Other Ambulatory Visit (HOSPITAL_BASED_OUTPATIENT_CLINIC_OR_DEPARTMENT_OTHER): Payer: 59 | Admitting: Lab

## 2013-10-20 ENCOUNTER — Encounter (INDEPENDENT_AMBULATORY_CARE_PROVIDER_SITE_OTHER): Payer: Self-pay

## 2013-10-20 DIAGNOSIS — I82402 Acute embolism and thrombosis of unspecified deep veins of left lower extremity: Secondary | ICD-10-CM

## 2013-10-20 DIAGNOSIS — I82409 Acute embolism and thrombosis of unspecified deep veins of unspecified lower extremity: Secondary | ICD-10-CM

## 2013-10-20 NOTE — Progress Notes (Signed)
INR = 2.  Goal range 2-3. Patient's INR is therapeutic today. She states she missed her Coumadin dose yesterday. No changes to medications. No dietary changes. No complications of anticoagulation noted. She will continue Coumadin 7 mg daily and return for INR/MD visit/Coumadin Clinic on 11/13/13. Cletis Athens, PharmD

## 2013-11-13 ENCOUNTER — Ambulatory Visit (HOSPITAL_BASED_OUTPATIENT_CLINIC_OR_DEPARTMENT_OTHER): Payer: 59 | Admitting: Adult Health

## 2013-11-13 ENCOUNTER — Other Ambulatory Visit (HOSPITAL_BASED_OUTPATIENT_CLINIC_OR_DEPARTMENT_OTHER): Payer: 59 | Admitting: Lab

## 2013-11-13 ENCOUNTER — Ambulatory Visit: Payer: 59 | Admitting: Pharmacist

## 2013-11-13 ENCOUNTER — Encounter: Payer: Self-pay | Admitting: Adult Health

## 2013-11-13 ENCOUNTER — Telehealth: Payer: Self-pay | Admitting: *Deleted

## 2013-11-13 VITALS — BP 131/85 | HR 91 | Temp 98.5°F | Resp 18 | Ht 64.0 in | Wt 216.5 lb

## 2013-11-13 DIAGNOSIS — R61 Generalized hyperhidrosis: Secondary | ICD-10-CM

## 2013-11-13 DIAGNOSIS — I82402 Acute embolism and thrombosis of unspecified deep veins of left lower extremity: Secondary | ICD-10-CM

## 2013-11-13 DIAGNOSIS — C50419 Malignant neoplasm of upper-outer quadrant of unspecified female breast: Secondary | ICD-10-CM

## 2013-11-13 DIAGNOSIS — C50412 Malignant neoplasm of upper-outer quadrant of left female breast: Secondary | ICD-10-CM

## 2013-11-13 DIAGNOSIS — Z171 Estrogen receptor negative status [ER-]: Secondary | ICD-10-CM

## 2013-11-13 DIAGNOSIS — D509 Iron deficiency anemia, unspecified: Secondary | ICD-10-CM

## 2013-11-13 DIAGNOSIS — I82409 Acute embolism and thrombosis of unspecified deep veins of unspecified lower extremity: Secondary | ICD-10-CM

## 2013-11-13 LAB — COMPREHENSIVE METABOLIC PANEL (CC13)
Albumin: 3.3 g/dL — ABNORMAL LOW (ref 3.5–5.0)
Anion Gap: 10 mEq/L (ref 3–11)
CO2: 24 mEq/L (ref 22–29)
Calcium: 9.3 mg/dL (ref 8.4–10.4)
Chloride: 107 mEq/L (ref 98–109)
Glucose: 143 mg/dl — ABNORMAL HIGH (ref 70–140)
Potassium: 4 mEq/L (ref 3.5–5.1)
Sodium: 142 mEq/L (ref 136–145)
Total Protein: 7.4 g/dL (ref 6.4–8.3)

## 2013-11-13 LAB — CBC WITH DIFFERENTIAL/PLATELET
Eosinophils Absolute: 0.2 10*3/uL (ref 0.0–0.5)
LYMPH%: 18.8 % (ref 14.0–49.7)
MONO#: 0.5 10*3/uL (ref 0.1–0.9)
NEUT#: 6.2 10*3/uL (ref 1.5–6.5)
Platelets: 275 10*3/uL (ref 145–400)
RBC: 4.36 10*6/uL (ref 3.70–5.45)
RDW: 19.8 % — ABNORMAL HIGH (ref 11.2–14.5)
WBC: 8.7 10*3/uL (ref 3.9–10.3)
lymph#: 1.6 10*3/uL (ref 0.9–3.3)

## 2013-11-13 LAB — PROTIME-INR: Protime: 30 Seconds — ABNORMAL HIGH (ref 10.6–13.4)

## 2013-11-13 MED ORDER — VENLAFAXINE HCL 37.5 MG PO TABS: 37.5000 mg | ORAL_TABLET | Freq: Two times a day (BID) | ORAL | Status: DC

## 2013-11-13 NOTE — Patient Instructions (Signed)
Venlafaxine tablets What is this medicine? VENLAFAXINE (VEN la fax een) is used to treat depression, anxiety and panic disorder. This medicine may be used for other purposes; ask your health care provider or pharmacist if you have questions. COMMON BRAND NAME(S): Effexor What should I tell my health care provider before I take this medicine? They need to know if you have any of these conditions: -bleeding disorders -glaucoma -heart disease -high blood pressure -high cholesterol -kidney disease -liver disease -low levels of sodium in the blood -mania or bipolar disorder -seizures -suicidal thoughts, plans, or attempt; a previous suicide attempt by you or a family -take medicines that treat or prevent blood clots -thyroid disease -an unusual or allergic reaction to venlafaxine, desvenlafaxine, other medicines, foods, dyes, or preservatives -pregnant or trying to get pregnant -breast-feeding How should I use this medicine? Take this medicine by mouth with a glass of water. Follow the directions on the prescription label. Take it with food. Take your medicine at regular intervals. Do not take your medicine more often than directed. Do not stop taking this medicine suddenly except upon the advice of your doctor. Stopping this medicine too quickly may cause serious side effects or your condition may worsen. A special MedGuide will be given to you by the pharmacist with each prescription and refill. Be sure to read this information carefully each time. Talk to your pediatrician regarding the use of this medicine in children. Special care may be needed. Overdosage: If you think you have taken too much of this medicine contact a poison control center or emergency room at once. NOTE: This medicine is only for you. Do not share this medicine with others. What if I miss a dose? If you miss a dose, take it as soon as you can. If it is almost time for your next dose, take only that dose. Do not take  double or extra doses. What may interact with this medicine? Do not take this medicine with any of the following medications: -cisapride -desvenlafaxine -dofetilide -dronedarone -duloxetine -levomilnacipran -linezolid -MAOIs like Carbex, Eldepryl, Marplan, Nardil, and Parnate -methylene blue (injected into a vein) -milnacipran -pimozide -thioridazine -ziprasidone This medicine may also interact with the following medications: -aspirin and aspirin-like medicines -certain medicines for depression, anxiety, or psychotic disturbances -certain medicines for migraine headaches like almotriptan, eletriptan, frovatriptan, naratriptan, rizatriptan, sumatriptan, zolmitriptan -cimetidine -clozapine -diuretics -fentanyl -furazolidone -indinavir -isoniazid -ketoconazole -lithium -medicines for sleep -medicines that treat or prevent blood clots like warfarin, enoxaparin, and dalteparin -metoprolol -NSAIDS, medicines for pain and inflammation, like ibuprofen or naproxen -other medicines that prolong the QT interval (cause an abnormal heart rhythm) -procarbazine -rasagiline -supplements like St. John's wort, kava kava, valerian -tramadol -tryptophan This list may not describe all possible interactions. Give your health care provider a list of all the medicines, herbs, non-prescription drugs, or dietary supplements you use. Also tell them if you smoke, drink alcohol, or use illegal drugs. Some items may interact with your medicine. What should I watch for while using this medicine? Tell your doctor if your symptoms do not get better or if they get worse. Visit your doctor or health care professional for regular checks on your progress. Because it may take several weeks to see the full effects of this medicine, it is important to continue your treatment as prescribed by your doctor. Patients and their families should watch out for new or worsening thoughts of suicide or depression. Also watch  out for sudden changes in feelings such as feeling anxious, agitated,  panicky, irritable, hostile, aggressive, impulsive, severely restless, overly excited and hyperactive, or not being able to sleep. If this happens, especially at the beginning of treatment or after a change in dose, call your health care professional. This medicine can cause an increase in blood pressure. Check with your doctor for instructions on monitoring your blood pressure while taking this medicine. You may get drowsy or dizzy. Do not drive, use machinery, or do anything that needs mental alertness until you know how this medicine affects you. Do not stand or sit up quickly, especially if you are an older patient. This reduces the risk of dizzy or fainting spells. Alcohol may interfere with the effect of this medicine. Avoid alcoholic drinks. Your mouth may get dry. Chewing sugarless gum, sucking hard candy and drinking plenty of water will help. Contact your doctor if the problem does not go away or is severe. What side effects may I notice from receiving this medicine? Side effects that you should report to your doctor or health care professional as soon as possible: -allergic reactions like skin rash, itching or hives, swelling of the face, lips, or tongue -breathing problems -changes in vision -seizures -suicidal thoughts or other mood changes -trouble passing urine or change in the amount of urine -unusual bleeding or bruising Side effects that usually do not require medical attention (report to your doctor or health care professional if they continue or are bothersome): -change in sex drive or performance -constipation -increased sweating -loss of appetite -nausea -tremors -weight loss This list may not describe all possible side effects. Call your doctor for medical advice about side effects. You may report side effects to FDA at 1-800-FDA-1088. Where should I keep my medicine? Keep out of the reach of  children. Store at a controlled temperature between 20 and 25 degrees C (68 and 77 degrees F), in a dry place. Throw away any unused medicine after the expiration date. NOTE: This sheet is a summary. It may not cover all possible information. If you have questions about this medicine, talk to your doctor, pharmacist, or health care provider.  2014, Elsevier/Gold Standard. (2012-11-15 16:40:44)

## 2013-11-13 NOTE — Progress Notes (Signed)
OFFICE PROGRESS NOTE  CC Dr. Ovidio Kin Dr. 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 Pierce Crane Dr. Ovidio Kin and Dr. Lurline Hare.Patient originally felt lumpiness in her left breast. This prompted a mammogram.Her mammogram was performed on 08/25/2012 showed pleomorphic calcifications of the entire upper outer left breast measuring 11 x 12 x 15 cm. On exam she had a for a multinodular mass like area over the left upper quadrant. Ultrasound confirmed multiple ill-defined hypoechoic areas. Ultrasound of the left axilla showed a few abnormal appearing lymph nodes. Biopsy of the lymph nodes and the breast was performed on 09/01/2012. 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 The ER was 2% PR 0%.   #2Patient was recommended A mastectomy. 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 Lymphovascular invasion was identified all resection margins were negative the 2 sentinel nodes were removed one was positive for metastatic disease. Patient then went on to have a full axillary lymph node dissection performed the pathology showed 10 lymph nodes negative for metastatic disease therefore 1 of 12 lymph nodes were positive. Final pathologic staging was T T3 N1. Tumor was ER negative PR negative HER-2/neu negative.  #3 patient did have genetic counseling performed but she declined genetic testing at that time. We discussed genetic counseling and testing again in patient is now agreeable and we will get this taken care of.  #4 patient was seen by  Dr. Pierce Crane on 11/17/2012 he recommended PET scan which was negative for metastatic disease. He also recommended adjuvant chemotherapy consisting of FEC given every 2 weeks for a total of 4 cycles. She  completed the [facility] as of end of March 2014.  She also had iron deficiency and received Feraheme in December.    #5 on 03/17/2013 patient received adjuvant Taxol carboplatinum weekly for a total of 12 weeks.  This completed on 06/02/13.  #5 patient with left lower extremity DVT in March, 2014, she was placed on Coumadin until December, 2014.    #6 Radiation therapy from 06/20/13-07/31/13.  CURRENT THERAPY: observation  INTERVAL HISTORY: Hannah Hale 47 y.o. female returns for followup visit today.  She is doing well today.  Her main complaint is hot flashes.  Her neuropathy is stable, she continues to take the neurontin without difficulty.   Other than that she denies fevers, chills, pain, unintentional weight loss, easy bruising/bleeding, or any other concerns.    MEDICAL HISTORY: Past Medical History  Diagnosis Date  . Coughing     cold recent   . Breast cancer     left, Sept 20 2013  . GERD (gastroesophageal reflux disease)   . Fibroids     uterine    ALLERGIES:  has No Known Allergies.  MEDICATIONS:  Current Outpatient Prescriptions  Medication Sig Dispense Refill  . diphenhydrAMINE (SOMINEX) 25 MG tablet Take 50 mg by mouth 2 (two) times daily as needed. For itching      . gabapentin (NEURONTIN) 100 MG capsule Take 3 capsules (300 mg total) by mouth 3 (three) times daily.  270 capsule  3  .  SUPER B COMPLEX/C CAPS Take 1 tablet by mouth daily.  30 capsule  2  . warfarin (COUMADIN) 5 MG tablet Take 1-2 tablets by mouth daily as directed.  60 tablet  1   No current facility-administered medications for this visit.    SURGICAL HISTORY:  Past Surgical History  Procedure Laterality Date  . Simple mastectomy w/ sentinel node biopsy  11/03/2012  . Simple mastectomy with  axillary sentinel node biopsy  11/03/2012    Procedure: SIMPLE MASTECTOMY WITH AXILLARY SENTINEL NODE BIOPSY;  Surgeon: Kandis Cocking, MD;  Location: [facility] OR;  Service: General;  Laterality: Left;  . Portacath placement  12/08/2012    Procedure: INSERTION PORT-A-CATH;  Surgeon: Kandis Cocking, MD;  Location: [facility] ORS;  Service: General;  Laterality: N/A;  Power Port Placment and Left Axillary Node Dissection  . Axillary lymph node dissection  12/08/2012    Procedure: AXILLARY LYMPH NODE DISSECTION;  Surgeon: Kandis Cocking, MD;  Location: [facility] ORS;  Service: General;  Laterality: Left;    REVIEW OF SYSTEMS:   A 10 point review of systems was conducted and is otherwise negative except for what is noted above.    Mammogram in 08/2013 was normal  PHYSICAL EXAMINATION: Blood pressure 131/85, pulse 91, temperature 98.5 F (36.9 C), temperature source Oral, resp. rate 18, height 5\' 4"  (1.626 m), weight 216 lb 8 oz (98.204 kg). Body mass index is 37.14 kg/(m^2). General: Patient is a well appearing female in no acute distress HEENT: PERRLA, sclerae anicteric no conjunctival pallor, MMM Neck: supple, no palpable adenopathy Lungs: clear to auscultation bilaterally, no wheezes, rhonchi, or rales Cardiovascular: regular rate rhythm, S1, S2, no murmurs, rubs or gallops,  Abdomen: Soft, non-tender, non-distended, normoactive bowel sounds, no HSM Extremities: warm and well perfused, no clubbing, cyanosis, or edema, swelling in left upper extremity, decreased from prior Skin: No rashes or lesions  Neuro: Non-focal Breasts: left mastectomy site without nodularity, skin is hyperpigmented, well healed, right breast no masses/nodularity ECOG PERFORMANCE STATUS: 0 - Asymptomatic   LABORATORY DATA: Lab Results  Component Value Date   WBC 8.7 11/13/2013   HGB 11.2* 11/13/2013   HCT 36.0 11/13/2013   MCV 82.6 11/13/2013   PLT 275 11/13/2013      Chemistry      Component Value Date/Time   NA 142 08/11/2013 1032    NA 135 06/02/2013 1401   K 3.8 08/11/2013 1032   K 3.6 06/02/2013 1401   CL 105 06/02/2013 1401   CL 105 05/26/2013 0828   CO2 23 08/11/2013 1032   CO2 25 06/02/2013 1401   BUN 5.1* 08/11/2013 1032   BUN 11 06/02/2013 1401   CREATININE 0.7 08/11/2013 1032   CREATININE 0.56 06/02/2013 1401      Component Value Date/Time   CALCIUM 9.2 08/11/2013 1032   CALCIUM 9.0 06/02/2013 1401   ALKPHOS 149 08/11/2013 1032   ALKPHOS 111 06/02/2013 1401   AST 39* 08/11/2013 1032   AST 34 06/02/2013 1401   ALT 21 08/11/2013 1032   ALT 30 06/02/2013 1401   BILITOT 0.23 08/11/2013 1032   BILITOT 0.2* 06/02/2013 1401       RADIOGRAPHIC STUDIES:  Dg Chest Port 1 View  12/08/2012  *RADIOLOGY REPORT*  Clinical Data: Post line placement.  Rule out pneumothorax.  The  PORTABLE CHEST - 1 VIEW  Comparison: 11/25/2012 PET CT.  10/26/2012 chest x-ray.  Findings: Curvilinear appearance right apex along the undersurface of the posterior aspect of the right  second rib probably related to rib rather than pneumothorax.   This can be assessed on follow-up examination if there are any progressive symptoms to suggest pneumothorax.  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  Cardiomegaly.  Mild central pulmonary vascular prominence.  Prior left breast surgery and left axillary lymph node dissection.  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  No definitive pneumothorax.  Curvilinear structure right lung apex probably associated with the undersurface of rib as noted above.  This is a call report.   Original Report Authenticated By: Lacy Duverney, M.D.    Dg C-arm 1-60 Min-no Report  12/08/2012  CLINICAL DATA: port-a-cath insertion   C-ARM 1-60 MINUTES  Fluoroscopy was utilized by the requesting physician.  No radiographic  interpretation.      ASSESSMENT: 47 year old female with   #1stage III a( T3 N1) invasive ductal carcinoma she is status post mastectomy with axillary lymph node dissection with one of 12  lymph nodes positive for metastatic disease. Tumor was ER negative PR negative HER-2/neu negative. She had her mastectomy on 11/03/2012 with axillary lymph node dissection performed on 12/08/2012 and she had a Port-A-Cath placed at that time as well. She should have no evidence of metastatic disease elsewhere distally.  #2 patient has completed 4 cycles of FEC that was given dose dense. She received this from 01/10/2013 through March 2014.  #3 she underwent Taxol carboplatinum adjuvantly on a weekly basis starting on 03/17/2013 for a total of 12 weeks.  #4 patient also had  DVT in 02/2013 and was on Lovenox then Coumadin.  This was stopped in 11/2013.   #5 Iron deficiency, patient required parenteral iron in December, 2013. We will recheck iron studies in 3 months.  Her hemoglobin is stable.    #6 Patient underwent adjuvant radiation therapy from 06/20/13 through 07/31/13.   PLAN:  #1 Patient is doing well today.  She has no sign of recurrence.  She will go to the flush room and have her port flushed today.    #2 She has been on Coumadin since March, 2014 for a DVT in her left leg.  After reviewing with Dr. Welton Flakes, she can stop the coumadin today.    #3 I prescribed Effexor for her hot flashes.  I discussed this with her in detail and gave her information about the medication in her AVS.    #4  I will contact Dr. Ezzard Standing for her to be set up for port removal.    #5 She will return to [place] for close observation in 3 months.    All questions were answered. The patient knows to call the clinic with any problems, questions or concerns. We can certainly see the patient much sooner if necessary.  I spent 25 minutes counseling the patient face to face. The total time spent in the appointment was 30 minutes.  Illa Level, NP Medical Oncology [facility] [phone]

## 2013-11-13 NOTE — Telephone Encounter (Signed)
appts made and printed...td 

## 2013-12-21 ENCOUNTER — Encounter (INDEPENDENT_AMBULATORY_CARE_PROVIDER_SITE_OTHER): Payer: Self-pay | Admitting: Surgery

## 2013-12-21 ENCOUNTER — Ambulatory Visit (INDEPENDENT_AMBULATORY_CARE_PROVIDER_SITE_OTHER): Payer: 59 | Admitting: Surgery

## 2013-12-21 VITALS — BP 130/76 | HR 74 | Resp 18 | Ht 64.0 in | Wt 215.0 lb

## 2013-12-21 DIAGNOSIS — C50412 Malignant neoplasm of upper-outer quadrant of left female breast: Secondary | ICD-10-CM

## 2013-12-21 DIAGNOSIS — C50419 Malignant neoplasm of upper-outer quadrant of unspecified female breast: Secondary | ICD-10-CM

## 2013-12-21 NOTE — Progress Notes (Signed)
Re:   Hannah Hale DOB:   05/30/1966 MRN:   [id number]  [place]  ASSESSMENT AND PLAN: 1.  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  LVI identified.  1/2 lymph nodes involved.  T3, N1., ER - neg, PR - neg., Ki67 - 30% (Stage IIIA)  Saw [facility] (did see Dr. Rubin)/Dr. Pablo Ledger.  Left simple mastectomy/left axillary SLNBx - 11/03/2012  Left completion axillary node dissection/right subclavian power port - 12/08/2012  Final path - 0/10 nodes (so final total was 1/12 nodes)   Finished chemotx and radiation tx.  I'll see her back in 6 months.  1A.  Right subclavian power port. [Note from [place]: Power port can come out.  DN 11/14/2013]  1a. Lymphedema of left arm - .  Overall this is better  2.  Smokes cigarettes - 1/2 pack per day  Has not cut back much 3.  Chronic anemia. 4.  Left leg DVT - on coumadin  REFERRING PHYSICIAN: Provider Not In System  HISTORY OF PRESENT ILLNESS: Hannah Hale is a 48 y.o. (DOB: 1966-09-03)  AA female whose primary care physician is Provider Not In System and through the [facility] for left breast cancer.  She sees Dr. Linda Hedges at [facility].   She is doing well.  I have not seen her is 8 months.  She has completed her rad tx.  The lymphedema of her right arm is better.  She has no concerns. We talked about removing the power port.   History of left breast cancer: [name] felt a lumpiness in her left breast recently.  This prompted a mammogram.  She has had no prior breast problems or surgery.  This was her first mammogram. She still is having regular periods. LMP was 3 weeks.  Has no children.  No family history of breast cancer. MRI - 09/08/2012.  The abnormal area in total measures 17.6(AP) x 8.8 (trv) x 9.4 (CC) cm.   Past Medical History  Diagnosis Date  . Coughing     cold recent   . Breast cancer     left, Sept 20 2013  . GERD (gastroesophageal reflux disease)   . Fibroids    uterine     Current Outpatient Prescriptions  Medication Sig Dispense Refill  . diphenhydrAMINE (SOMINEX) 25 MG tablet Take 50 mg by mouth 2 (two) times daily as needed. For itching      . gabapentin (NEURONTIN) 100 MG capsule Take 3 capsules (300 mg total) by mouth 3 (three) times daily.  270 capsule  3  . SUPER B COMPLEX/C CAPS Take 1 tablet by mouth daily.  30 capsule  2  . venlafaxine (EFFEXOR) 37.5 MG tablet Take 1 tablet (37.5 mg total) by mouth 2 (two) times daily.  60 tablet  12   No current facility-administered medications for this visit.    No Known Allergies  REVIEW OF SYSTEMS: Pulmonary:  Smokes 1/2 ppd. She knows this is bad for her health.  SOCIAL and FAMILY HISTORY: Married.   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 She has a sister, Becky Sax.  PHYSICAL EXAM: BP 130/76  Pulse 74  Resp 18  Ht _0  (1.626 m)  Wt 215 lb (97.523 kg)  BMI 36.89 kg/m2   General: AA F who is alert and generally healthy appearing.  Breasts:  Left:  Absent.  She has pigmentation changes of the left chest wall fromt he radiation tx.  Right:  No mass.. Chest:  Right subclavian power port. Extremities:  No evidence of lymphedema.  DATA REVIEWED: Epic notes. Mammogram ([facility]) - 09/12/2013 - negative.  Alphonsa Overall, MD,  [facility], [address]  [place], [place]    [place] Phone:  [phone] FAX:  [phone]

## 2013-12-22 ENCOUNTER — Other Ambulatory Visit (INDEPENDENT_AMBULATORY_CARE_PROVIDER_SITE_OTHER): Payer: Self-pay | Admitting: *Deleted

## 2013-12-22 ENCOUNTER — Telehealth (INDEPENDENT_AMBULATORY_CARE_PROVIDER_SITE_OTHER): Payer: Self-pay | Admitting: Surgery

## 2013-12-22 MED ORDER — UNABLE TO FIND: Status: DC

## 2013-12-22 NOTE — Telephone Encounter (Signed)
Pt saw Dr Lucia Gaskins 12/21/13 orders for [facility] removal sent to surgery scheduling  Pt has [facility] balance from 3/14 no payments made Please advise

## 2014-02-12 ENCOUNTER — Telehealth: Payer: Self-pay | Admitting: Oncology

## 2014-02-12 ENCOUNTER — Ambulatory Visit (HOSPITAL_BASED_OUTPATIENT_CLINIC_OR_DEPARTMENT_OTHER): Payer: 59 | Admitting: Oncology

## 2014-02-12 ENCOUNTER — Encounter: Payer: Self-pay | Admitting: Oncology

## 2014-02-12 ENCOUNTER — Other Ambulatory Visit: Payer: Self-pay | Admitting: *Deleted

## 2014-02-12 ENCOUNTER — Other Ambulatory Visit (HOSPITAL_BASED_OUTPATIENT_CLINIC_OR_DEPARTMENT_OTHER): Payer: 59

## 2014-02-12 VITALS — BP 147/91 | HR 90 | Temp 98.1°F | Resp 18 | Ht 64.0 in | Wt 216.1 lb

## 2014-02-12 DIAGNOSIS — N959 Unspecified menopausal and perimenopausal disorder: Secondary | ICD-10-CM

## 2014-02-12 DIAGNOSIS — C50419 Malignant neoplasm of upper-outer quadrant of unspecified female breast: Secondary | ICD-10-CM

## 2014-02-12 DIAGNOSIS — D509 Iron deficiency anemia, unspecified: Secondary | ICD-10-CM

## 2014-02-12 DIAGNOSIS — I82409 Acute embolism and thrombosis of unspecified deep veins of unspecified lower extremity: Secondary | ICD-10-CM

## 2014-02-12 DIAGNOSIS — C50412 Malignant neoplasm of upper-outer quadrant of left female breast: Secondary | ICD-10-CM

## 2014-02-12 DIAGNOSIS — G589 Mononeuropathy, unspecified: Secondary | ICD-10-CM

## 2014-02-12 DIAGNOSIS — G629 Polyneuropathy, unspecified: Secondary | ICD-10-CM

## 2014-02-12 DIAGNOSIS — Z86718 Personal history of other venous thrombosis and embolism: Secondary | ICD-10-CM

## 2014-02-12 DIAGNOSIS — Z170 Estrogen receptor positive status [ER+]: Secondary | ICD-10-CM

## 2014-02-12 LAB — CBC WITH DIFFERENTIAL/PLATELET
BASO%: 0.9 % (ref 0.0–2.0)
BASOS ABS: 0.1 10*3/uL (ref 0.0–0.1)
EOS ABS: 0.2 10*3/uL (ref 0.0–0.5)
EOS%: 2.6 % (ref 0.0–7.0)
HEMATOCRIT: 35.8 % (ref 34.8–46.6)
HEMOGLOBIN: 11.6 g/dL (ref 11.6–15.9)
LYMPH%: 19.1 % (ref 14.0–49.7)
MCH: 27.5 pg (ref 25.1–34.0)
MCHC: 32.4 g/dL (ref 31.5–36.0)
MCV: 84.9 fL (ref 79.5–101.0)
MONO#: 0.5 10*3/uL (ref 0.1–0.9)
MONO%: 5.7 % (ref 0.0–14.0)
NEUT%: 71.7 % (ref 38.4–76.8)
NEUTROS ABS: 6.8 10*3/uL — AB (ref 1.5–6.5)
PLATELETS: 286 10*3/uL (ref 145–400)
RBC: 4.22 10*6/uL (ref 3.70–5.45)
RDW: 17.7 % — AB (ref 11.2–14.5)
WBC: 9.5 10*3/uL (ref 3.9–10.3)
lymph#: 1.8 10*3/uL (ref 0.9–3.3)

## 2014-02-12 LAB — COMPREHENSIVE METABOLIC PANEL (CC13)
ALBUMIN: 3.3 g/dL — AB (ref 3.5–5.0)
ALK PHOS: 149 U/L (ref 40–150)
ALT: 17 U/L (ref 0–55)
AST: 20 U/L (ref 5–34)
Anion Gap: 8 mEq/L (ref 3–11)
BUN: 10.9 mg/dL (ref 7.0–26.0)
CO2: 24 mEq/L (ref 22–29)
CREATININE: 0.7 mg/dL (ref 0.6–1.1)
Calcium: 9.2 mg/dL (ref 8.4–10.4)
Chloride: 109 mEq/L (ref 98–109)
GLUCOSE: 133 mg/dL (ref 70–140)
POTASSIUM: 3.9 meq/L (ref 3.5–5.1)
Sodium: 140 mEq/L (ref 136–145)
Total Bilirubin: 0.24 mg/dL (ref 0.20–1.20)
Total Protein: 7.1 g/dL (ref 6.4–8.3)

## 2014-02-12 LAB — IRON AND TIBC CHCC
%SAT: 10 % — AB (ref 21–57)
IRON: 45 ug/dL (ref 41–142)
TIBC: 441 ug/dL (ref 236–444)
UIBC: 397 ug/dL — ABNORMAL HIGH (ref 120–384)

## 2014-02-12 LAB — FERRITIN CHCC: FERRITIN: 14 ng/mL (ref 9–269)

## 2014-02-12 MED ORDER — VENLAFAXINE HCL 37.5 MG PO TABS: 37.5000 mg | ORAL_TABLET | Freq: Two times a day (BID) | ORAL | Status: DC

## 2014-02-12 MED ORDER — LIDOCAINE-PRILOCAINE 2.5-2.5 % EX CREA: TOPICAL_CREAM | CUTANEOUS | Status: DC | PRN

## 2014-02-12 MED ORDER — GABAPENTIN 100 MG PO CAPS: 300.0000 mg | ORAL_CAPSULE | Freq: Three times a day (TID) | ORAL | Status: DC

## 2014-02-12 NOTE — Progress Notes (Signed)
OFFICE PROGRESS NOTE  CC Dr. Alphonsa Overall Dr. 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   Primary site: Breast (Left)   Staging method: AJCC 7th Edition   Pathologic: Stage IIIA (T3, N1, cM0) signed by Deatra Robinson, MD on 02/12/2014  7:49 PM   Summary: Stage IIIA (T3, N1, cM0)  PRIOR THERAPY:  #1 patient was originally seen in the multidisciplinary breast clinic on 09/07/2012 by Dr. [name] Dr. Alphonsa Overall and Dr. Thea Silversmith.Patient originally felt lumpiness in her left breast. This prompted a mammogram.Her mammogram was performed on 08/25/2012 showed pleomorphic calcifications of the entire upper outer left breast measuring 11 x 12 x 15 cm. On exam she had a for a multinodular mass like area over the left upper quadrant. Ultrasound confirmed multiple ill-defined hypoechoic areas. Ultrasound of the left axilla showed a few abnormal appearing lymph nodes. Biopsy of the lymph nodes and the breast was performed on 09/01/2012. 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 The ER was 2% PR 0%.   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 Lymphovascular invasion was identified all resection margins were negative the 2 sentinel nodes were removed one was positive for metastatic disease. Patient then went on to have a full axillary lymph node dissection performed the pathology showed 10 lymph nodes negative for metastatic disease therefore 1 of 12 lymph nodes were positive. Final pathologic staging was T T3 N1. Tumor was ER negative PR negative HER-2/neu negative.  #3 GENETICS: genetic  counseling performed but she declined genetic testing at that time. We discussed genetic counseling and testing again in patient is now agreeable and we will get this taken care of.  #4 INITIAL STAGING: by Dr. [name] on 11/17/2012 he recommended PET scan which was negative for metastatic disease. He also recommended adjuvant chemotherapy consisting of FEC given every 2 weeks for a total of 4 cycles. She  completed the [facility] as of end of March 2014.  She also had iron deficiency and received Feraheme in December.    #5 ADJUVANT CHEMOTHERAPY: 03/17/2013 patient received adjuvant Taxol carboplatinum weekly for a total of 12 weeks.  This completed on 06/02/13.  #5 patient with left lower extremity DVT in March, 2014, S/P Coumadin until December, 2014.    #6 S/P Radiation therapy from 06/20/13-07/31/13.  CURRENT THERAPY: observation  INTERVAL HISTORY: Hannah Hale 48 y.o. female returns for followup visit today.  She is doing well today.  Her main complaint is hot flashes. She is taking effexor 75 mg daily.Her neuropathy is stable, she continues to take the neurontin without difficulty.   Other than that she denies fevers, chills, pain, unintentional weight loss, easy bruising/bleeding, or any other concerns. She wants to have her port removed. We try to arrange this at her surgeon's office. Remainder of the 10 point review of systems is negative.    MEDICAL HISTORY: Past Medical History  Diagnosis Date  . Coughing     cold recent   . Breast cancer     left, Sept 20 2013  . GERD (gastroesophageal reflux disease)   . Fibroids  uterine    ALLERGIES:  has No Known Allergies.  MEDICATIONS:  Current Outpatient Prescriptions  Medication Sig Dispense Refill  . diphenhydrAMINE (SOMINEX) 25 MG tablet Take 50 mg by mouth 2 (two) times daily as needed. For itching      . gabapentin (NEURONTIN) 100 MG capsule Take 3 capsules (300 mg total) by mouth 3 (three) times daily.  270 capsule  3  .  SUPER B COMPLEX/C CAPS Take 1 tablet by mouth daily.  30 capsule  2  . UNABLE TO FIND Rx: N4709- Custome Breast Prosthesis Dx: 174.9; Left Mastectomy  1 each  0  . venlafaxine (EFFEXOR) 37.5 MG tablet Take 1 tablet (37.5 mg total) by mouth 2 (two) times daily.  60 tablet  12  . lidocaine-prilocaine (EMLA) cream Apply topically as needed.  30 g  6   No current facility-administered medications for this visit.    SURGICAL HISTORY:  Past Surgical History  Procedure Laterality Date  . Simple mastectomy w/ sentinel node biopsy  11/03/2012  . Simple mastectomy with axillary sentinel node biopsy  11/03/2012    Procedure: SIMPLE MASTECTOMY WITH AXILLARY SENTINEL NODE BIOPSY;  Surgeon: Shann Medal, MD;  Location: [facility];  Service: General;  Laterality: Left;  . Portacath placement  12/08/2012    Procedure: INSERTION PORT-A-CATH;  Surgeon: Shann Medal, MD;  Location: [facility];  Service: General;  Laterality: N/A;  Power Port Placment and Left Axillary Node Dissection  . Axillary lymph node dissection  12/08/2012    Procedure: AXILLARY LYMPH NODE DISSECTION;  Surgeon: Shann Medal, MD;  Location: [facility];  Service: General;  Laterality: Left;    REVIEW OF SYSTEMS:   A 10 point review of systems was conducted and is otherwise negative except for what is noted above.    Mammogram in 08/2013 was normal  PHYSICAL EXAMINATION: Blood pressure 147/91, pulse 90, temperature 98.1 F (36.7 C), temperature source Oral, resp. rate 18, height _0  (1.626 m), weight 216 lb 1.6 oz (98.022 kg). Body mass index is 37.08 kg/(m^2). General: Patient is a well appearing female in no acute distress HEENT: PERRLA, sclerae anicteric no conjunctival pallor, MMM Neck: supple, no palpable adenopathy Lungs: clear to auscultation bilaterally, no wheezes, rhonchi, or rales Cardiovascular: regular rate rhythm, S1, S2, no murmurs, rubs or gallops,  Abdomen: Soft, non-tender, non-distended, normoactive bowel sounds, no  HSM Extremities: warm and well perfused, no clubbing, cyanosis, or edema, swelling in left upper extremity, decreased from prior Skin: No rashes or lesions  Neuro: Non-focal Breasts: left mastectomy site without nodularity, skin is hyperpigmented, well healed, right breast no masses/nodularity ECOG PERFORMANCE STATUS: 0 - Asymptomatic   LABORATORY DATA: Lab Results  Component Value Date   WBC 9.5 02/12/2014   HGB 11.6 02/12/2014   HCT 35.8 02/12/2014   MCV 84.9 02/12/2014   PLT 286 02/12/2014      Chemistry      Component Value Date/Time   NA 140 02/12/2014 0939   NA 135 06/02/2013 1401   K 3.9 02/12/2014 0939   K 3.6 06/02/2013 1401   CL 105 06/02/2013 1401   CL 105 05/26/2013 0828   CO2 24 02/12/2014 0939   CO2 25 06/02/2013 1401   BUN 10.9 02/12/2014 0939   BUN 11 06/02/2013 1401   CREATININE 0.7 02/12/2014 0939   CREATININE 0.56 06/02/2013 1401      Component Value Date/Time   CALCIUM 9.2 02/12/2014 0939   CALCIUM 9.0 06/02/2013 1401  ALKPHOS 149 02/12/2014 0939   ALKPHOS 111 06/02/2013 1401   AST 20 02/12/2014 0939   AST 34 06/02/2013 1401   ALT 17 02/12/2014 0939   ALT 30 06/02/2013 1401   BILITOT 0.24 02/12/2014 0939   BILITOT 0.2* 06/02/2013 1401       RADIOGRAPHIC STUDIES:  Dg Chest Port 1 View  12/08/2012  *RADIOLOGY REPORT*  Clinical Data: Post line placement.  Rule out pneumothorax.  The  PORTABLE CHEST - 1 VIEW  Comparison: 11/25/2012 PET CT.  10/26/2012 chest x-ray.  Findings: Curvilinear appearance right apex along the undersurface of the posterior aspect of the right second rib probably related to rib rather than pneumothorax.   This can be assessed on follow-up examination if there are any progressive symptoms to suggest pneumothorax.  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  Cardiomegaly.  Mild central pulmonary vascular prominence.  Prior left breast surgery and left axillary lymph node dissection.  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  No definitive  pneumothorax.  Curvilinear structure right lung apex probably associated with the undersurface of rib as noted above.  This is a call report.   Original Report Authenticated By: Genia Del, M.D.    Dg C-arm 1-60 Min-no Report  12/08/2012  CLINICAL DATA: port-a-cath insertion   C-ARM 1-60 MINUTES  Fluoroscopy was utilized by the requesting physician.  No radiographic  interpretation.      ASSESSMENT/PLAN: 48 year old female with   #1stage III a( T3 N1) invasive ductal carcinoma she is status post mastectomy with axillary lymph node dissection with one of 12 lymph nodes positive for metastatic disease. Tumor was ER negative PR negative HER-2/neu negative. She had her mastectomy on 11/03/2012 with axillary lymph node dissection performed on 12/08/2012. s/p 4 cycles of FEC that was given dose dense. She received this from 01/10/2013 through March 2014. s/p Taxol carboplatinum adjuvantly on a weekly basis starting on 03/17/2013 for a total of 12 weeks. S/P adjuvant post mastectomy radiation therapy completed 06/20/13 - 07/31/13. Overall patient is doing well. She has no evidence of recurrent disease.  #2 History of   DVT in 02/2013 and was on Lovenox then Coumadin.  This was stopped in 11/2013.   #3 Iron deficiency: her H/H is stable, continue to observe.  #4 Hot Flashes: currently receiving effexor 75 mg daily  #5. 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  #6. Health Maintenance: will set patient up for a mammogram and she is encouraged to her exercise, eat healthy and maintain a good BMI.  #7  I will contact Dr. Lucia Gaskins for her to be set up for port removal.    #8 Follow up: patient will be seen back in 6 months with blood work.  All questions were answered. The patient knows to call the clinic with any problems, questions or concerns. We can certainly see the patient much sooner if necessary.  I spent 25 minutes counseling the patient face to face. The total time  spent in the appointment was 30 minutes.  Marcy Panning, MD Medical/Oncology [facility] [phone] (beeper) [phone] (Office)  02/12/2014, 7:46 PM

## 2014-02-12 NOTE — Patient Instructions (Signed)
Port a cath flushes every 2 months  Prescription for Effexor gabapentin were sent to her pharmacy.  I will see you back in 6 months time in followup with blood work.  We'll ask Dr. Ashok Cordia office to remove the Port-A-Cath.

## 2014-02-12 NOTE — Telephone Encounter (Signed)
, °

## 2014-02-20 ENCOUNTER — Ambulatory Visit (HOSPITAL_BASED_OUTPATIENT_CLINIC_OR_DEPARTMENT_OTHER): Payer: 59

## 2014-02-20 VITALS — BP 154/90 | HR 90 | Temp 98.3°F

## 2014-02-20 DIAGNOSIS — Z95828 Presence of other vascular implants and grafts: Secondary | ICD-10-CM

## 2014-02-20 DIAGNOSIS — C50419 Malignant neoplasm of upper-outer quadrant of unspecified female breast: Secondary | ICD-10-CM

## 2014-02-20 DIAGNOSIS — Z452 Encounter for adjustment and management of vascular access device: Secondary | ICD-10-CM

## 2014-02-20 MED ORDER — SODIUM CHLORIDE 0.9 % IJ SOLN
10.0000 mL | INTRAMUSCULAR | Status: DC | PRN
  Administered 2014-02-20: 10 mL via INTRAVENOUS
  Filled 2014-02-20: qty 10

## 2014-02-20 MED ORDER — HEPARIN SOD (PORK) LOCK FLUSH 100 UNIT/ML IV SOLN
500.0000 [IU] | Freq: Once | INTRAVENOUS | Status: AC
  Administered 2014-02-20: 500 [IU] via INTRAVENOUS
  Filled 2014-02-20: qty 5

## 2014-02-20 NOTE — Patient Instructions (Signed)

## 2014-04-17 ENCOUNTER — Ambulatory Visit (HOSPITAL_BASED_OUTPATIENT_CLINIC_OR_DEPARTMENT_OTHER): Payer: 59

## 2014-04-17 VITALS — BP 145/83 | HR 88 | Temp 97.6°F | Resp 17

## 2014-04-17 DIAGNOSIS — Z452 Encounter for adjustment and management of vascular access device: Secondary | ICD-10-CM

## 2014-04-17 DIAGNOSIS — C50419 Malignant neoplasm of upper-outer quadrant of unspecified female breast: Secondary | ICD-10-CM

## 2014-04-17 DIAGNOSIS — Z95828 Presence of other vascular implants and grafts: Secondary | ICD-10-CM

## 2014-04-17 MED ORDER — SODIUM CHLORIDE 0.9 % IJ SOLN
10.0000 mL | INTRAMUSCULAR | Status: DC | PRN
  Administered 2014-04-17: 10 mL via INTRAVENOUS
  Filled 2014-04-17: qty 10

## 2014-04-17 MED ORDER — HEPARIN SOD (PORK) LOCK FLUSH 100 UNIT/ML IV SOLN
500.0000 [IU] | Freq: Once | INTRAVENOUS | Status: AC
  Administered 2014-04-17: 500 [IU] via INTRAVENOUS
  Filled 2014-04-17: qty 5

## 2014-04-17 NOTE — Patient Instructions (Signed)

## 2014-05-12 ENCOUNTER — Other Ambulatory Visit: Payer: Self-pay | Admitting: *Deleted

## 2014-05-12 ENCOUNTER — Ambulatory Visit: Payer: 59 | Admitting: Family Medicine

## 2014-05-12 VITALS — BP 160/100 | HR 85 | Temp 98.4°F | Resp 16 | Ht 64.25 in | Wt 214.8 lb

## 2014-05-12 DIAGNOSIS — Z720 Tobacco use: Secondary | ICD-10-CM

## 2014-05-12 DIAGNOSIS — R05 Cough: Secondary | ICD-10-CM

## 2014-05-12 DIAGNOSIS — J019 Acute sinusitis, unspecified: Secondary | ICD-10-CM

## 2014-05-12 DIAGNOSIS — F17200 Nicotine dependence, unspecified, uncomplicated: Secondary | ICD-10-CM

## 2014-05-12 DIAGNOSIS — I10 Essential (primary) hypertension: Secondary | ICD-10-CM

## 2014-05-12 DIAGNOSIS — R059 Cough, unspecified: Secondary | ICD-10-CM

## 2014-05-12 MED ORDER — FLUTICASONE PROPIONATE 50 MCG/ACT NA SUSP: 2.0000 | Freq: Every day | NASAL | Status: DC

## 2014-05-12 MED ORDER — LOSARTAN POTASSIUM-HCTZ 50-12.5 MG PO TABS: 1.0000 | ORAL_TABLET | Freq: Every day | ORAL | Status: DC

## 2014-05-12 MED ORDER — AMOXICILLIN 875 MG PO TABS: 875.0000 mg | ORAL_TABLET | Freq: Two times a day (BID) | ORAL | Status: DC

## 2014-05-12 MED ORDER — BENZONATATE 100 MG PO CAPS: 200.0000 mg | ORAL_CAPSULE | Freq: Two times a day (BID) | ORAL | Status: DC | PRN

## 2014-05-12 MED ORDER — HYDROCODONE-HOMATROPINE 5-1.5 MG/5ML PO SYRP: 5.0000 mL | ORAL_SOLUTION | Freq: Every evening | ORAL | Status: DC | PRN

## 2014-05-12 NOTE — Patient Instructions (Signed)
Hypertension As your heart beats, it forces blood through your arteries. This force is your blood pressure. If the pressure is too high, it is called hypertension (HTN) or high blood pressure. HTN is dangerous because you may have it and not know it. High blood pressure may mean that your heart has to work harder to pump blood. Your arteries may be narrow or stiff. The extra work puts you at risk for heart disease, stroke, and other problems.  Blood pressure consists of two numbers, a higher number over a lower, 110/72, for example. It is stated as "110 over 72." The ideal is below 120 for the top number (systolic) and under 80 for the bottom (diastolic). Write down your blood pressure today. You should pay close attention to your blood pressure if you have certain conditions such as:  Heart failure.  Prior heart attack.  Diabetes  Chronic kidney disease.  Prior stroke.  Multiple risk factors for heart disease. To see if you have HTN, your blood pressure should be measured while you are seated with your arm held at the level of the heart. It should be measured at least twice. A one-time elevated blood pressure reading (especially in the Emergency Department) does not mean that you need treatment. There may be conditions in which the blood pressure is different between your right and left arms. It is important to see your caregiver soon for a recheck. Most people have essential hypertension which means that there is not a specific cause. This type of high blood pressure may be lowered by changing lifestyle factors such as:  Stress.  Smoking.  Lack of exercise.  Excessive weight.  Drug/tobacco/alcohol use.  Eating less salt. Most people do not have symptoms from high blood pressure until it has caused damage to the body. Effective treatment can often prevent, delay or reduce that damage. TREATMENT  When a cause has been identified, treatment for high blood pressure is directed at the  cause. There are a large number of medications to treat HTN. These fall into several categories, and your caregiver will help you select the medicines that are best for you. Medications may have side effects. You should review side effects with your caregiver. If your blood pressure stays high after you have made lifestyle changes or started on medicines,   Your medication(s) may need to be changed.  Other problems may need to be addressed.  Be certain you understand your prescriptions, and know how and when to take your medicine.  Be sure to follow up with your caregiver within the time frame advised (usually within two weeks) to have your blood pressure rechecked and to review your medications.  If you are taking more than one medicine to lower your blood pressure, make sure you know how and at what times they should be taken. Taking two medicines at the same time can result in blood pressure that is too low. SEEK IMMEDIATE MEDICAL CARE IF:  You develop a severe headache, blurred or changing vision, or confusion.  You have unusual weakness or numbness, or a faint feeling.  You have severe chest or abdominal pain, vomiting, or breathing problems. MAKE SURE YOU:   Understand these instructions.  Will watch your condition.  Will get help right away if you are not doing well or get worse. Document Released: 11/30/2005 Document Revised: 02/22/2012 Document Reviewed: 07/20/2008 [facility] Patient Information 2014 [place]. Sinusitis Sinusitis is redness, soreness, and swelling (inflammation) of the paranasal sinuses. Paranasal sinuses are air  pockets within the bones of your face (beneath the eyes, the middle of the forehead, or above the eyes). In healthy paranasal sinuses, mucus is able to drain out, and air is able to circulate through them by way of your nose. However, when your paranasal sinuses are inflamed, mucus and air can become trapped. This can allow bacteria and other germs  to grow and cause infection. Sinusitis can develop quickly and last only a short time (acute) or continue over a long period (chronic). Sinusitis that lasts for more than 12 weeks is considered chronic.  CAUSES  Causes of sinusitis include:  Allergies.  Structural abnormalities, such as displacement of the cartilage that separates your nostrils (deviated septum), which can decrease the air flow through your nose and sinuses and affect sinus drainage.  Functional abnormalities, such as when the small hairs (cilia) that line your sinuses and help remove mucus do not work properly or are not present. SYMPTOMS  Symptoms of acute and chronic sinusitis are the same. The primary symptoms are pain and pressure around the affected sinuses. Other symptoms include:  Upper toothache.  Earache.  Headache.  Bad breath.  Decreased sense of smell and taste.  A cough, which worsens when you are lying flat.  Fatigue.  Fever.  Thick drainage from your nose, which often is green and may contain pus (purulent).  Swelling and warmth over the affected sinuses. DIAGNOSIS  Your caregiver will perform a physical exam. During the exam, your caregiver may:  Look in your nose for signs of abnormal growths in your nostrils (nasal polyps).  Tap over the affected sinus to check for signs of infection.  View the inside of your sinuses (endoscopy) with a special imaging device with a light attached (endoscope), which is inserted into your sinuses. If your caregiver suspects that you have chronic sinusitis, one or more of the following tests may be recommended:  Allergy tests.  Nasal culture A sample of mucus is taken from your nose and sent to a lab and screened for bacteria.  Nasal cytology A sample of mucus is taken from your nose and examined by your caregiver to determine if your sinusitis is related to an allergy. TREATMENT  Most cases of acute sinusitis are related to a viral infection and will  resolve on their own within 10 days. Sometimes medicines are prescribed to help relieve symptoms (pain medicine, decongestants, nasal steroid sprays, or saline sprays).  However, for sinusitis related to a bacterial infection, your caregiver will prescribe antibiotic medicines. These are medicines that will help kill the bacteria causing the infection.  Rarely, sinusitis is caused by a fungal infection. In theses cases, your caregiver will prescribe antifungal medicine. For some cases of chronic sinusitis, surgery is needed. Generally, these are cases in which sinusitis recurs more than 3 times per year, despite other treatments. HOME CARE INSTRUCTIONS   Drink plenty of water. Water helps thin the mucus so your sinuses can drain more easily.  Use a humidifier.  Inhale steam 3 to 4 times a day (for example, sit in the bathroom with the shower running).  Apply a warm, moist washcloth to your face 3 to 4 times a day, or as directed by your caregiver.  Use saline nasal sprays to help moisten and clean your sinuses.  Take over-the-counter or prescription medicines for pain, discomfort, or fever only as directed by your caregiver. SEEK IMMEDIATE MEDICAL CARE IF:  You have increasing pain or severe headaches.  You have nausea, vomiting,   or drowsiness.  You have swelling around your face.  You have vision problems.  You have a stiff neck.  You have difficulty breathing. MAKE SURE YOU:   Understand these instructions.  Will watch your condition.  Will get help right away if you are not doing well or get worse. Document Released: 11/30/2005 Document Revised: 02/22/2012 Document Reviewed: 12/15/2011 [facility] Patient Information 2014 [place], [place].

## 2014-05-12 NOTE — Progress Notes (Signed)
Chief Complaint:  Chief Complaint  Patient presents with  . Hypertension    pt stated her BP was elevated this week    HPI: Hannah Hale is a 48 y.o. female who is here for  3 day hx of Elevated BP and cough and congestion Her BP was 174/100 on Thursday, then Her BP was 174/120 She also has had cold sxs during the same time, She laid down for 30 min, SBP--/90 Yesterday it was 156/102, Then last night it was 150/90 Cold sxs started Thursday, Sore throat. + sinus pain.  She take effexor and gabapentin but no meds + HA , No vision changes, n.v. abd pain, CP or SOB.  She has had mildly elevated BP in the past with chemo but no prior history of HTN  Past Medical History  Diagnosis Date  . Coughing     cold recent   . Breast cancer     left, Sept 20 2013  . GERD (gastroesophageal reflux disease)   . Fibroids     uterine   Past Surgical History  Procedure Laterality Date  . Simple mastectomy w/ sentinel node biopsy  11/03/2012  . Simple mastectomy with axillary sentinel node biopsy  11/03/2012    Procedure: SIMPLE MASTECTOMY WITH AXILLARY SENTINEL NODE BIOPSY;  Surgeon: Shann Medal, MD;  Location: [place];  Service: General;  Laterality: Left;  . Portacath placement  12/08/2012    Procedure: INSERTION PORT-A-CATH;  Surgeon: Shann Medal, MD;  [facility];  Service: General;  Laterality: N/A;  Power Port Placment and Left Axillary Node Dissection  . Axillary lymph node dissection  12/08/2012    Procedure: AXILLARY LYMPH NODE DISSECTION;  Surgeon: Shann Medal, MD;  Location: [facility];  Service: General;  Laterality: Left;   History   Social History  . Marital Status: Married    Spouse Name: N/A    Number of Children: N/A  . Years of Education: N/A   Social History Main Topics  . Smoking status: Current Every Day Smoker -- 0.50 packs/day for 29 years    Types: Cigarettes  . Smokeless tobacco: Never Used  . Alcohol Use: 0.0 oz/week     Comment: 6  pack/week  . Drug Use: No     Comment: stated 06/03/13  . Sexual Activity: Yes   Other Topics Concern  . None   Social History Narrative  . None   Family History  Problem Relation Age of Onset  . Lung cancer Maternal Grandfather   . Diabetes Mother   . Prostate cancer Father 38  . Stroke Maternal Grandmother   . Lung cancer Cousin     non smoker, died in his 20s; paternal cousin  . Lung cancer Cousin     father's maternal cousin; smoker   No Known Allergies Prior to Admission medications   Medication Sig Start Date End Date Taking? Authorizing Provider  diphenhydrAMINE (SOMINEX) 25 MG tablet Take 50 mg by mouth 2 (two) times daily as needed. For itching   Yes Historical Provider, MD  gabapentin (NEURONTIN) 100 MG capsule Take 3 capsules (300 mg total) by mouth 3 (three) times daily. 02/12/14  Yes Deatra Robinson, MD  lidocaine-prilocaine (EMLA) cream Apply topically as needed. 02/12/14  Yes Deatra Robinson, MD  SUPER B COMPLEX/C CAPS Take 1 tablet by mouth daily. 05/12/13  Yes Minette Headland, NP  UNABLE TO FIND Rx: [phone]- Custome Breast Prosthesis Dx: 174.9; Left Mastectomy 12/22/13  Yes Shann Medal, MD  venlafaxine (EFFEXOR) 37.5 MG tablet Take 1 tablet (37.5 mg total) by mouth 2 (two) times daily. 02/12/14  Yes Deatra Robinson, MD     ROS: The patient denies fevers, chills, night sweats, unintentional weight loss, chest pain, palpitations, wheezing, dyspnea on exertion, nausea, vomiting, abdominal pain, dysuria, hematuria, melena, + chronic numbness, weakness, or tingling.   All other systems have been reviewed and were otherwise negative with the exception of those mentioned in the HPI and as above.    PHYSICAL EXAM: Filed Vitals:   05/12/14 0907  BP: 160/100  Pulse: 85  Temp: 98.4 F (36.9 C)  Resp: 16   Filed Vitals:   05/12/14 0907  Height: 5' 4.25" (1.632 m)  Weight: 214 lb 12.8 oz (97.433 kg)   Body mass index is 36.58 kg/(m^2).  General: Alert, no acute  distress HEENT:  Normocephalic, atraumatic, oropharynx patent. EOMI, PERRLA. + sinus tenderness, right max , TM nl, no euxdates.  Cardiovascular:  Regular rate and rhythm, no rubs murmurs or gallops.  No Carotid bruits, radial pulse intact. No pedal edema.  Respiratory: Clear to auscultation bilaterally.  No wheezes, rales, or rhonchi.  No cyanosis, no use of accessory musculature GI: No organomegaly, abdomen is soft and non-tender, positive bowel sounds.  No masses. Skin: No rashes. Neurologic: Facial musculature symmetric. Psychiatric: Patient is appropriate throughout our interaction. Lymphatic: No cervical lymphadenopathy Musculoskeletal: Gait intact.   LABS: Results for orders placed in visit on 02/12/14  CBC WITH DIFFERENTIAL      Result Value Ref Range   WBC 9.5  3.9 - 10.3 10e3/uL   NEUT# 6.8 (*) 1.5 - 6.5 10e3/uL   HGB 11.6  11.6 - 15.9 g/dL   HCT 35.8  34.8 - 46.6 %   Platelets 286  145 - 400 10e3/uL   MCV 84.9  79.5 - 101.0 fL   MCH 27.5  25.1 - 34.0 pg   MCHC 32.4  31.5 - 36.0 g/dL   RBC 4.22  3.70 - 5.45 10e6/uL   RDW 17.7 (*) 11.2 - 14.5 %   lymph# 1.8  0.9 - 3.3 10e3/uL   MONO# 0.5  0.1 - 0.9 10e3/uL   Eosinophils Absolute 0.2  0.0 - 0.5 10e3/uL   Basophils Absolute 0.1  0.0 - 0.1 10e3/uL   NEUT% 71.7  38.4 - 76.8 %   LYMPH% 19.1  14.0 - 49.7 %   MONO% 5.7  0.0 - 14.0 %   EOS% 2.6  0.0 - 7.0 %   BASO% 0.9  0.0 - 2.0 %  COMPREHENSIVE METABOLIC PANEL (IE33)      Result Value Ref Range   Sodium 140  136 - 145 mEq/L   Potassium 3.9  3.5 - 5.1 mEq/L   Chloride 109  98 - 109 mEq/L   CO2 24  22 - 29 mEq/L   Glucose 133  70 - 140 mg/dl   BUN 10.9  7.0 - 26.0 mg/dL   Creatinine 0.7  0.6 - 1.1 mg/dL   Total Bilirubin 0.24  0.20 - 1.20 mg/dL   Alkaline Phosphatase 149  40 - 150 U/L   AST 20  5 - 34 U/L   ALT 17  0 - 55 U/L   Total Protein 7.1  6.4 - 8.3 g/dL   Albumin 3.3 (*) 3.5 - 5.0 g/dL   Calcium 9.2  8.4 - 10.4 mg/dL   Anion Gap 8  3 - 11 mEq/L  FERRITIN  [facility]      Result Value Ref Range   Ferritin 14  9 - 269 ng/ml  IRON AND TIBC [facility]      Result Value Ref Range   Iron 45  41 - 142 ug/dL   TIBC 441  236 - 444 ug/dL   UIBC 397 (*) 120 - 384 ug/dL   %SAT 10 (*) 21 - 57 %     EKG/XRAY:   Primary read interpreted by Dr. Marin Comment at [facility].   ASSESSMENT/PLAN: Encounter Diagnoses  Name Primary?  . HTN (hypertension) Yes  . Sinusitis, acute   . Tobacco use   . Cough    Pleasant 48 y/o female with history of elevated BP, breast cancer and tobacco use who is here with 3 days of elevated BP with HA  and also sinusitis. No stroke sxs, no CP/SOB.  She will call with BP readings in 1 week, if all normal then F/u in 84month. Recent labs done, will repeat in 1 month Rx Amoxacillin, Tessalon perles, Losartan/HCTZ 50/12.5 mg, Flonase ( if too expensive then use otc nasocort) F/u sooner if not feeling well.  Gross sideeffects, risk and benefits, and alternatives of medications d/w patient. Patient is aware that all medications have potential sideeffects and we are unable to predict every sideeffect or drug-drug interaction that may occur.  Glenford Bayley, DO 05/12/2014 10:39 AM

## 2014-05-21 ENCOUNTER — Telehealth: Payer: Self-pay

## 2014-05-21 NOTE — Telephone Encounter (Signed)
PT CALLED IN TO REPORT HER BP FOR THE WEEK. SLPNPY-051/10 Monday-130/88--STATED IT FELL IN THE AFTERNOON TO 47/51 SHE WAS OUT OF WORK BECAUSE OF THIS ON Tuesday AND Wednesday Thursday- 102/64 Friday-116/62 Saturday-110/64 Sunday-114/78  HER NUMBER IS [phone]

## 2014-05-28 NOTE — Telephone Encounter (Signed)
LM to call me re BP

## 2014-06-12 ENCOUNTER — Ambulatory Visit (HOSPITAL_BASED_OUTPATIENT_CLINIC_OR_DEPARTMENT_OTHER): Payer: 59

## 2014-06-12 VITALS — BP 140/83 | HR 90 | Temp 97.5°F | Resp 16

## 2014-06-12 DIAGNOSIS — C50419 Malignant neoplasm of upper-outer quadrant of unspecified female breast: Secondary | ICD-10-CM

## 2014-06-12 DIAGNOSIS — Z452 Encounter for adjustment and management of vascular access device: Secondary | ICD-10-CM

## 2014-06-12 DIAGNOSIS — Z95828 Presence of other vascular implants and grafts: Secondary | ICD-10-CM

## 2014-06-12 MED ORDER — SODIUM CHLORIDE 0.9 % IJ SOLN
10.0000 mL | INTRAMUSCULAR | Status: DC | PRN
  Administered 2014-06-12: 10 mL via INTRAVENOUS
  Filled 2014-06-12: qty 10

## 2014-06-12 MED ORDER — HEPARIN SOD (PORK) LOCK FLUSH 100 UNIT/ML IV SOLN
500.0000 [IU] | Freq: Once | INTRAVENOUS | Status: AC
  Administered 2014-06-12: 500 [IU] via INTRAVENOUS
  Filled 2014-06-12: qty 5

## 2014-06-12 NOTE — Patient Instructions (Signed)

## 2014-07-17 ENCOUNTER — Ambulatory Visit (HOSPITAL_BASED_OUTPATIENT_CLINIC_OR_DEPARTMENT_OTHER): Payer: 59

## 2014-07-17 VITALS — BP 153/93 | HR 92 | Temp 98.5°F

## 2014-07-17 DIAGNOSIS — Z95828 Presence of other vascular implants and grafts: Secondary | ICD-10-CM

## 2014-07-17 DIAGNOSIS — Z452 Encounter for adjustment and management of vascular access device: Secondary | ICD-10-CM

## 2014-07-17 DIAGNOSIS — C50419 Malignant neoplasm of upper-outer quadrant of unspecified female breast: Secondary | ICD-10-CM

## 2014-07-17 MED ORDER — SODIUM CHLORIDE 0.9 % IJ SOLN
10.0000 mL | INTRAMUSCULAR | Status: DC | PRN
  Administered 2014-07-17: 10 mL via INTRAVENOUS
  Filled 2014-07-17: qty 10

## 2014-07-17 MED ORDER — HEPARIN SOD (PORK) LOCK FLUSH 100 UNIT/ML IV SOLN
500.0000 [IU] | Freq: Once | INTRAVENOUS | Status: AC
Start: 2014-07-17 — End: 2014-07-17
  Administered 2014-07-17: 500 [IU] via INTRAVENOUS
  Filled 2014-07-17: qty 5

## 2014-07-17 NOTE — Patient Instructions (Signed)

## 2014-08-08 ENCOUNTER — Telehealth: Payer: Self-pay | Admitting: *Deleted

## 2014-08-08 NOTE — Telephone Encounter (Signed)
Order faxed to [employer] [facility] for [id number] sent to scan

## 2014-08-23 ENCOUNTER — Telehealth: Payer: Self-pay | Admitting: Hematology and Oncology

## 2014-08-23 NOTE — Telephone Encounter (Signed)
, °

## 2014-08-27 ENCOUNTER — Other Ambulatory Visit: Payer: 59

## 2014-08-27 ENCOUNTER — Ambulatory Visit: Payer: 59 | Admitting: Oncology

## 2014-08-31 ENCOUNTER — Telehealth: Payer: Self-pay | Admitting: Hematology and Oncology

## 2014-08-31 NOTE — Telephone Encounter (Signed)
Lvm advising appt time chg on 9/30 from 8.15 to 8.45am.

## 2014-09-11 ENCOUNTER — Telehealth: Payer: Self-pay | Admitting: Hematology and Oncology

## 2014-09-11 NOTE — Telephone Encounter (Signed)
moved 9/30 appts to 10/1 due to [facility] late arrival. s/w pt she is aware.

## 2014-09-12 ENCOUNTER — Other Ambulatory Visit: Payer: 59

## 2014-09-12 ENCOUNTER — Encounter: Payer: 59 | Admitting: Adult Health

## 2014-09-13 ENCOUNTER — Telehealth: Payer: Self-pay | Admitting: Adult Health

## 2014-09-13 ENCOUNTER — Ambulatory Visit (HOSPITAL_BASED_OUTPATIENT_CLINIC_OR_DEPARTMENT_OTHER): Payer: 59 | Admitting: Adult Health

## 2014-09-13 ENCOUNTER — Ambulatory Visit (HOSPITAL_BASED_OUTPATIENT_CLINIC_OR_DEPARTMENT_OTHER): Payer: 59

## 2014-09-13 ENCOUNTER — Encounter: Payer: Self-pay | Admitting: Adult Health

## 2014-09-13 ENCOUNTER — Other Ambulatory Visit (HOSPITAL_BASED_OUTPATIENT_CLINIC_OR_DEPARTMENT_OTHER): Payer: 59

## 2014-09-13 VITALS — BP 157/82 | HR 73 | Temp 98.2°F | Resp 18 | Ht 64.0 in | Wt 219.1 lb

## 2014-09-13 DIAGNOSIS — C50412 Malignant neoplasm of upper-outer quadrant of left female breast: Secondary | ICD-10-CM

## 2014-09-13 DIAGNOSIS — D509 Iron deficiency anemia, unspecified: Secondary | ICD-10-CM

## 2014-09-13 DIAGNOSIS — Z862 Personal history of diseases of the blood and blood-forming organs and certain disorders involving the immune mechanism: Secondary | ICD-10-CM

## 2014-09-13 DIAGNOSIS — Z95828 Presence of other vascular implants and grafts: Secondary | ICD-10-CM

## 2014-09-13 LAB — IRON AND TIBC CHCC
%SAT: 12 % — ABNORMAL LOW (ref 21–57)
IRON: 52 ug/dL (ref 41–142)
TIBC: 421 ug/dL (ref 236–444)
UIBC: 369 ug/dL (ref 120–384)

## 2014-09-13 LAB — COMPREHENSIVE METABOLIC PANEL (CC13)
ALK PHOS: 155 U/L — AB (ref 40–150)
ALT: 20 U/L (ref 0–55)
AST: 17 U/L (ref 5–34)
Albumin: 3.4 g/dL — ABNORMAL LOW (ref 3.5–5.0)
Anion Gap: 8 mEq/L (ref 3–11)
BILIRUBIN TOTAL: 0.31 mg/dL (ref 0.20–1.20)
BUN: 8.4 mg/dL (ref 7.0–26.0)
CO2: 27 mEq/L (ref 22–29)
Calcium: 9.6 mg/dL (ref 8.4–10.4)
Chloride: 107 mEq/L (ref 98–109)
Creatinine: 0.8 mg/dL (ref 0.6–1.1)
GLUCOSE: 134 mg/dL (ref 70–140)
Potassium: 4.1 mEq/L (ref 3.5–5.1)
SODIUM: 142 meq/L (ref 136–145)
TOTAL PROTEIN: 7.3 g/dL (ref 6.4–8.3)

## 2014-09-13 LAB — CBC WITH DIFFERENTIAL/PLATELET
BASO%: 0.5 % (ref 0.0–2.0)
Basophils Absolute: 0 10*3/uL (ref 0.0–0.1)
EOS ABS: 0.2 10*3/uL (ref 0.0–0.5)
EOS%: 2.8 % (ref 0.0–7.0)
HCT: 39.4 % (ref 34.8–46.6)
HGB: 12.8 g/dL (ref 11.6–15.9)
LYMPH%: 30 % (ref 14.0–49.7)
MCH: 28.9 pg (ref 25.1–34.0)
MCHC: 32.5 g/dL (ref 31.5–36.0)
MCV: 88.9 fL (ref 79.5–101.0)
MONO#: 0.4 10*3/uL (ref 0.1–0.9)
MONO%: 5.8 % (ref 0.0–14.0)
NEUT#: 4.6 10*3/uL (ref 1.5–6.5)
NEUT%: 60.9 % (ref 38.4–76.8)
PLATELETS: 225 10*3/uL (ref 145–400)
RBC: 4.43 10*6/uL (ref 3.70–5.45)
RDW: 15.8 % — AB (ref 11.2–14.5)
WBC: 7.6 10*3/uL (ref 3.9–10.3)
lymph#: 2.3 10*3/uL (ref 0.9–3.3)

## 2014-09-13 LAB — FERRITIN CHCC: FERRITIN: 18 ng/mL (ref 9–269)

## 2014-09-13 MED ORDER — SODIUM CHLORIDE 0.9 % IJ SOLN
10.0000 mL | INTRAMUSCULAR | Status: DC | PRN
  Administered 2014-09-13: 10 mL via INTRAVENOUS
  Filled 2014-09-13: qty 10

## 2014-09-13 MED ORDER — HEPARIN SOD (PORK) LOCK FLUSH 100 UNIT/ML IV SOLN
500.0000 [IU] | Freq: Once | INTRAVENOUS | Status: AC
  Administered 2014-09-13: 500 [IU] via INTRAVENOUS
  Filled 2014-09-13: qty 5

## 2014-09-13 NOTE — Patient Instructions (Signed)

## 2014-09-13 NOTE — Patient Instructions (Addendum)
You are doing well.  You have no sign of recurrence.  I recommend healthy diet, exercise and monthly breast exams.  Please schedule your mammogram, you are due.  Breast Self-Awareness Practicing breast self-awareness may pick up problems early, prevent significant medical complications, and possibly save your life. By practicing breast self-awareness, you can become familiar with how your breasts look and feel and if your breasts are changing. This allows you to notice changes early. It can also offer you some reassurance that your breast health is good. One way to learn what is normal for your breasts and whether your breasts are changing is to do a breast self-exam. If you find a lump or something that was not present in the past, it is best to contact your caregiver right away. Other findings that should be evaluated by your caregiver include nipple discharge, especially if it is bloody; skin changes or reddening; areas where the skin seems to be pulled in (retracted); or new lumps and bumps. Breast pain is seldom associated with cancer (malignancy), but should also be evaluated by a caregiver. HOW TO PERFORM A BREAST SELF-EXAM The best time to examine your breasts is 5-7 days after your menstrual period is over. During menstruation, the breasts are lumpier, and it may be more difficult to pick up changes. If you do not menstruate, have reached menopause, or had your uterus removed (hysterectomy), you should examine your breasts at regular intervals, such as monthly. If you are breastfeeding, examine your breasts after a feeding or after using a breast pump. Breast implants do not decrease the risk for lumps or tumors, so continue to perform breast self-exams as recommended. Talk to your caregiver about how to determine the difference between the implant and breast tissue. Also, talk about the amount of pressure you should use during the exam. Over time, you will become more familiar with the variations of  your breasts and more comfortable with the exam. A breast self-exam requires you to remove all your clothes above the waist. 1. Look at your breasts and nipples. Stand in front of a mirror in a room with good lighting. With your hands on your hips, push your hands firmly downward. Look for a difference in shape, contour, and size from one breast to the other (asymmetry). Asymmetry includes puckers, dips, or bumps. Also, look for skin changes, such as reddened or scaly areas on the breasts. Look for nipple changes, such as discharge, dimpling, repositioning, or redness. 2. Carefully feel your breasts. This is best done either in the shower or tub while using soapy water or when flat on your back. Place the arm (on the side of the breast you are examining) above your head. Use the pads (not the fingertips) of your three middle fingers on your opposite hand to feel your breasts. Start in the underarm area and use  inch (2 cm) overlapping circles to feel your breast. Use 3 different levels of pressure (light, medium, and firm pressure) at each circle before moving to the next circle. The light pressure is needed to feel the tissue closest to the skin. The medium pressure will help to feel breast tissue a little deeper, while the firm pressure is needed to feel the tissue close to the ribs. Continue the overlapping circles, moving downward over the breast until you feel your ribs below your breast. Then, move one finger-width towards the center of the body. Continue to use the  inch (2 cm) overlapping circles to feel your breast  as you move slowly up toward the collar bone (clavicle) near the base of the neck. Continue the up and down exam using all 3 pressures until you reach the middle of the chest. Do this with each breast, carefully feeling for lumps or changes. 3.  Keep a written record with breast changes or normal findings for each breast. By writing this information down, you do not need to depend only on  memory for size, tenderness, or location. Write down where you are in your menstrual cycle, if you are still menstruating. Breast tissue can have some lumps or thick tissue. However, see your caregiver if you find anything that concerns you.  SEEK MEDICAL CARE IF:  You see a change in shape, contour, or size of your breasts or nipples.   You see skin changes, such as reddened or scaly areas on the breasts or nipples.   You have an unusual discharge from your nipples.   You feel a new lump or unusually thick areas.  Document Released: 11/30/2005 Document Revised: 11/16/2012 Document Reviewed: 03/16/2012 [facility] Patient Information 2015 [place], [place]. This information is not intended to replace advice given to you by your health care provider. Make sure you discuss any questions you have with your health care provider.

## 2014-09-13 NOTE — Progress Notes (Addendum)
OFFICE PROGRESS NOTE  CC Dr. Alphonsa Overall Dr. 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   Primary site: Breast (Left)   Staging method: AJCC 7th Edition   Pathologic: Stage IIIA (T3, N1, cM0) signed by Deatra Robinson, MD on 02/12/2014  7:49 PM   Summary: Stage IIIA (T3, N1, cM0)  PRIOR THERAPY:  #1 patient was originally seen in the multidisciplinary breast clinic on 09/07/2012 by Dr. [name] Dr. Alphonsa Overall and Dr. Thea Silversmith.Patient originally felt lumpiness in her left breast. This prompted a mammogram.Her mammogram was performed on 08/25/2012 showed pleomorphic calcifications of the entire upper outer left breast measuring 11 x 12 x 15 cm. On exam she had a for a multinodular mass like area over the left upper quadrant. Ultrasound confirmed multiple ill-defined hypoechoic areas. Ultrasound of the left axilla showed a few abnormal appearing lymph nodes. Biopsy of the lymph nodes and the breast was performed on 09/01/2012. 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 The ER was 2% PR 0%.   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 Lymphovascular invasion was identified all resection margins were negative the 2 sentinel nodes were removed one was positive for metastatic disease. Patient then went on to have a full axillary lymph node dissection performed the pathology showed 10 lymph nodes negative for metastatic disease therefore 1 of 12 lymph nodes were positive. Final pathologic staging was T T3 N1. Tumor was ER negative PR negative HER-2/neu negative.  #3 GENETICS: genetic  counseling performed but she declined genetic testing at that time. We discussed genetic counseling and testing again in patient is now agreeable and we will get this taken care of.  #4 INITIAL STAGING: by Dr. [name] on 11/17/2012 he recommended PET scan which was negative for metastatic disease. He also recommended adjuvant chemotherapy consisting of FEC given every 2 weeks for a total of 4 cycles. She  completed the [facility] as of end of March 2014.  She also had iron deficiency and received Feraheme in December.    #5 ADJUVANT CHEMOTHERAPY: 03/17/2013 patient received adjuvant Taxol carboplatinum weekly for a total of 12 weeks.  This completed on 06/02/13.  #5 patient with left lower extremity DVT in March, 2014, S/P Coumadin until December, 2014.    #6 S/P Radiation therapy from 06/20/13-07/31/13.  CURRENT THERAPY: observation  INTERVAL HISTORY: Hannah Hale 48 y.o. female returns for followup visit today.  She is doing well today.  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  She does c/o "boils" on her skin which have been present for a longstanding time prior to her cancer diagnosis.  She denies fevers, chills, pain, shortness of breath, bowel/bladder changes, or any further concerns. She continues to have a port in place. We updated her health maintenance below.     MEDICAL HISTORY: Past Medical History  Diagnosis Date  . Coughing     cold recent   . Breast cancer     left, Sept 20 2013  . GERD (gastroesophageal reflux disease)   . Fibroids  uterine    ALLERGIES:  has No Known Allergies.  MEDICATIONS:  Current Outpatient Prescriptions  Medication Sig Dispense Refill  . diphenhydrAMINE (SOMINEX) 25 MG tablet Take 50 mg by mouth 2 (two) times daily as needed. For itching      . gabapentin (NEURONTIN) 100 MG capsule Take 3 capsules (300 mg total) by mouth 3 (three) times daily.  270 capsule  3  . UNABLE TO FIND Rx:  [id number]- Custome Breast Prosthesis Dx: 174.9; Left Mastectomy  1 each  0  . venlafaxine (EFFEXOR) 37.5 MG tablet Take 1 tablet (37.5 mg total) by mouth 2 (two) times daily.  60 tablet  12   No current facility-administered medications for this visit.    SURGICAL HISTORY:  Past Surgical History  Procedure Laterality Date  . Simple mastectomy w/ sentinel node biopsy  11/03/2012  . Simple mastectomy with axillary sentinel node biopsy  11/03/2012    Procedure: SIMPLE MASTECTOMY WITH AXILLARY SENTINEL NODE BIOPSY;  Surgeon: Shann Medal, MD;  Location: [place];  Service: General;  Laterality: Left;  . Portacath placement  12/08/2012    Procedure: INSERTION PORT-A-CATH;  Surgeon: Shann Medal, MD;  Location: [facility];  Service: General;  Laterality: N/A;  Power Port Placment and Left Axillary Node Dissection  . Axillary lymph node dissection  12/08/2012    Procedure: AXILLARY LYMPH NODE DISSECTION;  Surgeon: Shann Medal, MD;  Location[facility];  Service: General;  Laterality: Left;    REVIEW OF SYSTEMS:   A 10 point review of systems was conducted and is otherwise negative except for what is noted above.    Health Maintenance  Mammogram: 09/12/2013 Colonoscopy: n/a Bone Density Scan: n/a Pap Smear: 2014 Eye Exam: 2013 Lipid Panel: unsure of when last checked   PHYSICAL EXAMINATION: Blood pressure 157/82, pulse 73, temperature 98.2 F (36.8 C), temperature source Oral, resp. rate 18, height _0  (1.626 m), weight 219 lb 1.6 oz (99.383 kg). Body mass index is 37.59 kg/(m^2). GENERAL: Patient is a well appearing female in no acute distress HEENT:  Sclerae anicteric.  Oropharynx clear and moist. No ulcerations or evidence of oropharyngeal candidiasis. Neck is supple.  NODES:  No cervical, supraclavicular, or axillary lymphadenopathy palpated.  BREAST EXAM:  Right breast with healing skin lesions, no masses, no nodularity.  Left mastectomy site with hyperpigmentation due to radiation, no  nodularity or sign of recurrence, benign bilateral breast exam. LUNGS:  Clear to auscultation bilaterally.  No wheezes or rhonchi. HEART:  Regular rate and rhythm. No murmur appreciated. ABDOMEN:  Soft, nontender.  Positive, normoactive bowel sounds. No organomegaly palpated. MSK:  No focal spinal tenderness to palpation. Full range of motion bilaterally in the upper extremities. EXTREMITIES:  No peripheral edema.   SKIN:  Clear with no obvious rashes or skin changes. No nail dyscrasia. NEURO:  Nonfocal. Well oriented.  Appropriate affect.   ECOG PERFORMANCE STATUS: 0 - Asymptomatic   LABORATORY DATA: Lab Results  Component Value Date   WBC 7.6 09/13/2014   HGB 12.8 09/13/2014   HCT 39.4 09/13/2014   MCV 88.9 09/13/2014   PLT 225 09/13/2014      Chemistry      Component Value Date/Time   NA 142 09/13/2014 1221   NA 135 06/02/2013 1401   K 4.1 09/13/2014 1221   K 3.6 06/02/2013 1401   CL 105 06/02/2013 1401   CL 105 05/26/2013 0828   CO2 27 09/13/2014 1221   CO2 25 06/02/2013 1401  BUN 8.4 09/13/2014 1221   BUN 11 06/02/2013 1401   CREATININE 0.8 09/13/2014 1221   CREATININE 0.56 06/02/2013 1401      Component Value Date/Time   CALCIUM 9.6 09/13/2014 1221   CALCIUM 9.0 06/02/2013 1401   ALKPHOS 155* 09/13/2014 1221   ALKPHOS 111 06/02/2013 1401   AST 17 09/13/2014 1221   AST 34 06/02/2013 1401   ALT 20 09/13/2014 1221   ALT 30 06/02/2013 1401   BILITOT 0.31 09/13/2014 1221   BILITOT 0.2* 06/02/2013 1401       RADIOGRAPHIC STUDIES:  Dg Chest Port 1 View  12/08/2012  *RADIOLOGY REPORT*  Clinical Data: Post line placement.  Rule out pneumothorax.  The  PORTABLE CHEST - 1 VIEW  Comparison: 11/25/2012 PET CT.  10/26/2012 chest x-ray.  Findings: Curvilinear appearance right apex along the undersurface of the posterior aspect of the right second rib probably related to rib rather than pneumothorax.   This can be assessed on follow-up examination if there are any progressive symptoms to suggest  pneumothorax.  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  Cardiomegaly.  Mild central pulmonary vascular prominence.  Prior left breast surgery and left axillary lymph node dissection.  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  No definitive pneumothorax.  Curvilinear structure right lung apex probably associated with the undersurface of rib as noted above.  This is a call report.   Original Report Authenticated By: Genia Del, M.D.    Dg C-arm 1-60 Min-no Report  12/08/2012  CLINICAL DATA: port-a-cath insertion   C-ARM 1-60 MINUTES  Fluoroscopy was utilized by the requesting physician.  No radiographic  interpretation.      ASSESSMENT: 48 year old female with   #1stage III a( T3 N1) invasive ductal carcinoma she is status post mastectomy with axillary lymph node dissection with one of 12 lymph nodes positive for metastatic disease. Tumor was ER negative PR negative HER-2/neu negative. She had her mastectomy on 11/03/2012 with axillary lymph node dissection performed on 12/08/2012. s/p 4 cycles of FEC that was given dose dense. She received this from 01/10/2013 through March 2014. s/p Taxol carboplatinum adjuvantly on a weekly basis starting on 03/17/2013 for a total of 12 weeks. S/P adjuvant post mastectomy radiation therapy completed 06/20/13 - 07/31/13.   #2 History of   DVT in 02/2013 and was on Lovenox then Coumadin.  This was stopped in 11/2013.   #3 Iron deficiency  #4. Neuropathy  PLAN:   Ryka is doing well today.  She has no sign of recurrence.  I reviewed her CBC with her today which is normal.  Her CMP and iron studies are pending.    We reviewed her health maintenance above.  She is due for mammogram and states that she is going to schedule this today.    She does have h/o iron deficiency, which appears to hopefully be resolving based on her normal hemoglobin today.  Her iron studies are pending.  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  It is well controlled on Gabapentin.    She does have skin lesions, which sound like they are a longstanding problem.  She will use gentle soap and work on keeping her skin dry rather than oily.    We have notified [facility] of her being cleared for port removal, however patient tells me that she cannot have her port removed due to having a balance at their office.  We will continue to flush her port  for the time being.    Lorene will return in 6 months for evaluation.   She knows to call us in the interim for any questions or concerns.  We can certainly see her sooner if needed.   I spent 25 minutes counseling the patient face to face. The total time spent in the appointment was 30 minutes.  Minette Headland, NP Medical Oncology [facility] [phone] 09/13/2014, 3:44 PM  Attending Note  I personally saw and examined Hannah Hale. The plan of care was discussed with her. I agree with the assessment and plan as documented above. Iron deficiency anemia with normal hemoglobin Agree with port removal Signed Rulon Eisenmenger, MD

## 2014-09-13 NOTE — Progress Notes (Signed)
This encounter was created in error - please disregard.

## 2014-09-13 NOTE — Telephone Encounter (Signed)
per pof to sch pt appt-gave pt copy of sch °

## 2014-09-13 NOTE — Progress Notes (Signed)
Patient in for Port-A-Cath flush and labs today. Patient's port was accessed and flushed with 10 mL of Normal Saline and 5 mL of Heparin. Blood return noted during flush, but only waste was obtained and then no blood return noted. Patient denied any pain or discomfort during and after flush. All labs drawn by Phlebotomist.

## 2014-09-13 NOTE — Addendum Note (Signed)
Addended by: Minette Headland on: 09/13/2014 03:50 PM   Modules accepted: Level of Service

## 2014-10-17 ENCOUNTER — Encounter: Payer: Self-pay | Admitting: Genetic Counselor

## 2014-10-17 ENCOUNTER — Other Ambulatory Visit: Payer: 59

## 2014-10-17 ENCOUNTER — Ambulatory Visit (HOSPITAL_BASED_OUTPATIENT_CLINIC_OR_DEPARTMENT_OTHER): Payer: 59

## 2014-10-17 ENCOUNTER — Ambulatory Visit (HOSPITAL_BASED_OUTPATIENT_CLINIC_OR_DEPARTMENT_OTHER): Payer: 59 | Admitting: Genetic Counselor

## 2014-10-17 DIAGNOSIS — Z8042 Family history of malignant neoplasm of prostate: Secondary | ICD-10-CM

## 2014-10-17 DIAGNOSIS — Z315 Encounter for genetic counseling: Secondary | ICD-10-CM

## 2014-10-17 DIAGNOSIS — C50412 Malignant neoplasm of upper-outer quadrant of left female breast: Secondary | ICD-10-CM

## 2014-10-17 DIAGNOSIS — Z452 Encounter for adjustment and management of vascular access device: Secondary | ICD-10-CM

## 2014-10-17 DIAGNOSIS — Z801 Family history of malignant neoplasm of trachea, bronchus and lung: Secondary | ICD-10-CM

## 2014-10-17 MED ORDER — HEPARIN SOD (PORK) LOCK FLUSH 100 UNIT/ML IV SOLN
500.0000 [IU] | Freq: Once | INTRAVENOUS | Status: AC
  Administered 2014-10-17: 500 [IU] via INTRAVENOUS
  Filled 2014-10-17: qty 5

## 2014-10-17 MED ORDER — SODIUM CHLORIDE 0.9 % IJ SOLN
10.0000 mL | INTRAMUSCULAR | Status: DC | PRN
  Administered 2014-10-17: 10 mL via INTRAVENOUS
  Filled 2014-10-17: qty 10

## 2014-10-17 NOTE — Patient Instructions (Signed)

## 2014-10-17 NOTE — Progress Notes (Signed)
Dr.  Nicholas Lose requested a consultation for genetic counseling and risk assessment for Hannah Hale, a 48 y.o. female, for discussion of her personal history of breast cancer and family history of prostate cancer.  She presents to clinic today to discuss the possibility of a genetic predisposition to cancer, and to further clarify her risks, as well as her family members' risks for cancer.   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 This was treated with left mastectomy, chemotherapy and radiation. Hannah Hale has never had a colonoscopy.   Past Medical History  Diagnosis Date  . Coughing     cold recent   . Breast cancer     left, Sept 20 2013  . GERD (gastroesophageal reflux disease)   . Fibroids     uterine    Past Surgical History  Procedure Laterality Date  . Simple mastectomy w/ sentinel node biopsy  11/03/2012  . Simple mastectomy with axillary sentinel node biopsy  11/03/2012    Procedure: SIMPLE MASTECTOMY WITH AXILLARY SENTINEL NODE BIOPSY;  Surgeon: Shann Medal, MD;  Location: [facility];  Service: General;  Laterality: Left;  . Portacath placement  12/08/2012    Procedure: INSERTION PORT-A-CATH;  Surgeon: Shann Medal, MD;  Location: [facility];  Service: General;  Laterality: N/A;  Power Port Placment and Left Axillary Node Dissection  . Axillary lymph node dissection  12/08/2012    Procedure: AXILLARY LYMPH NODE DISSECTION;  Surgeon: Shann Medal, MD;  Location: [facility];  Service: General;  Laterality: Left;    History   Social History  . Marital Status: Married    Spouse Name: N/A    Number of Children: 0  . Years of Education: N/A   Social History Main Topics  . Smoking status: Current Every Day Smoker -- 0.50 packs/day for 29 years    Types: Cigarettes  . Smokeless tobacco: Never Used  . Alcohol Use: 0.0 oz/week     Comment: 6  pack/week  . Drug Use: No     Comment: stated 06/03/13  . Sexual Activity: Yes   Other Topics Concern  . None   Social History Narrative    REPRODUCTIVE HISTORY AND PERSONAL RISK ASSESSMENT FACTORS: Menarche was at age 98-13.   postmenopausal Uterus Intact: yes Ovaries Intact: yes [id number], first live birth at age N/A  She has not previously undergone treatment for infertility.   Oral Contraceptive use: 14 years   She has not used HRT in the past.    FAMILY HISTORY:  We obtained a detailed, 4-generation family history.  Significant diagnoses are listed below: Family History  Problem Relation Age of Onset  . Lung cancer Maternal Grandfather   . Diabetes Mother   . Prostate cancer Father 24  . Stroke Maternal Grandmother   . Lung cancer Cousin     non smoker, died in his 20s; paternal cousin  . Lung cancer Cousin     father's maternal cousin; smoker  . Stroke Paternal Grandfather     Patient's maternal ancestors are of [place] and [place] descent, and paternal ancestors are of African American descent. There is no reported Ashkenazi Jewish ancestry. There is possible known consanguinity.  GENETIC COUNSELING ASSESSMENT: Hannah Hale is a 48 y.o. female with a personal history of triple negative breast cancer which somewhat suggestive of a hereditary cancer syndrome and  predisposition to cancer. We, therefore, discussed and recommended the following at today's visit.   DISCUSSION: We reviewed the characteristics, features and inheritance patterns of hereditary cancer syndromes. We also discussed genetic testing, including the appropriate family members to test, the process of testing, insurance coverage and turn-around-time for results. We reviewed the common hereditary cancer syndromes that are associated with young triple negative breast cancer including BRCA mutations and PALB2 mutations.  There have been other hereditary gene mutations that have been  associated less frequently with triple negative breast cancer as well.  We reviewed performing more targeted testing including only BRCA and PALB2 mutations, vs larger panel testing to include other genes.  We discussed that testing positive would allow [place] to follow her differently in the future and manage her care so that we could either prevent a cancer from occurring or identify something earlier on.  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  She took a card in case she decides to pursue in the future.  PLAN: After considering the risks, benefits, and limitations, Hannah Hale declined testing. We encouraged Hannah Hale to remain in contact with cancer genetics annually so that we can continuously update the family history and inform her of any changes in cancer genetics and testing that may be of benefit for her family. Hannah Hale's questions were answered to her satisfaction today. Our contact information was provided should additional questions or concerns arise.  The patient was seen for a total of 30 minutes, greater than 50% of which was spent face-to-face counseling.  This note will also be sent to the referring provider via the electronic medical record. The patient will be supplied with a summary of this genetic counseling discussion as well as educational information on the discussed hereditary cancer syndromes following the conclusion of their visit.    _______________________________________________________________________ For Office Staff:  Number of people involved in session: 1 Was an Intern/ student involved with case: no

## 2014-10-19 ENCOUNTER — Other Ambulatory Visit: Payer: Self-pay

## 2014-10-19 DIAGNOSIS — Z1231 Encounter for screening mammogram for malignant neoplasm of breast: Secondary | ICD-10-CM

## 2014-10-19 DIAGNOSIS — Z853 Personal history of malignant neoplasm of breast: Secondary | ICD-10-CM

## 2014-10-19 DIAGNOSIS — Z9012 Acquired absence of left breast and nipple: Secondary | ICD-10-CM

## 2014-11-16 ENCOUNTER — Ambulatory Visit
Admission: RE | Admit: 2014-11-16 | Discharge: 2014-11-16 | Disposition: A | Discharge status: Home / Self Care | Payer: 59 | Source: Ambulatory Visit

## 2014-11-16 ENCOUNTER — Other Ambulatory Visit: Payer: Self-pay

## 2014-11-16 DIAGNOSIS — Z1231 Encounter for screening mammogram for malignant neoplasm of breast: Secondary | ICD-10-CM

## 2014-11-16 DIAGNOSIS — Z853 Personal history of malignant neoplasm of breast: Secondary | ICD-10-CM

## 2014-11-16 DIAGNOSIS — Z9012 Acquired absence of left breast and nipple: Secondary | ICD-10-CM

## 2014-12-11 ENCOUNTER — Ambulatory Visit (HOSPITAL_BASED_OUTPATIENT_CLINIC_OR_DEPARTMENT_OTHER): Payer: 59

## 2014-12-11 VITALS — BP 140/75 | HR 89 | Temp 98.1°F

## 2014-12-11 DIAGNOSIS — C50412 Malignant neoplasm of upper-outer quadrant of left female breast: Secondary | ICD-10-CM

## 2014-12-11 DIAGNOSIS — Z452 Encounter for adjustment and management of vascular access device: Secondary | ICD-10-CM

## 2014-12-11 DIAGNOSIS — Z95828 Presence of other vascular implants and grafts: Secondary | ICD-10-CM

## 2014-12-11 MED ORDER — SODIUM CHLORIDE 0.9 % IJ SOLN
10.0000 mL | INTRAMUSCULAR | Status: DC | PRN
  Administered 2014-12-11: 10 mL via INTRAVENOUS
  Filled 2014-12-11: qty 10

## 2014-12-11 MED ORDER — HEPARIN SOD (PORK) LOCK FLUSH 100 UNIT/ML IV SOLN
500.0000 [IU] | Freq: Once | INTRAVENOUS | Status: AC
  Administered 2014-12-11: 500 [IU] via INTRAVENOUS
  Filled 2014-12-11: qty 5

## 2014-12-11 NOTE — Patient Instructions (Signed)

## 2015-02-28 ENCOUNTER — Other Ambulatory Visit: Payer: Self-pay | Admitting: Oncology

## 2015-02-28 NOTE — Telephone Encounter (Signed)
Last 09/2014.  Next ov 03/2015. Chart reviewed

## 2015-03-05 ENCOUNTER — Ambulatory Visit (HOSPITAL_BASED_OUTPATIENT_CLINIC_OR_DEPARTMENT_OTHER): Payer: 59

## 2015-03-05 VITALS — BP 138/55 | HR 82 | Temp 98.6°F | Resp 18

## 2015-03-05 DIAGNOSIS — Z452 Encounter for adjustment and management of vascular access device: Secondary | ICD-10-CM

## 2015-03-05 DIAGNOSIS — C50412 Malignant neoplasm of upper-outer quadrant of left female breast: Secondary | ICD-10-CM | POA: Diagnosis not present

## 2015-03-05 DIAGNOSIS — Z95828 Presence of other vascular implants and grafts: Secondary | ICD-10-CM

## 2015-03-05 MED ORDER — HEPARIN SOD (PORK) LOCK FLUSH 100 UNIT/ML IV SOLN
500.0000 [IU] | Freq: Once | INTRAVENOUS | Status: AC
  Administered 2015-03-05: 500 [IU] via INTRAVENOUS
  Filled 2015-03-05: qty 5

## 2015-03-05 MED ORDER — SODIUM CHLORIDE 0.9 % IJ SOLN
10.0000 mL | INTRAMUSCULAR | Status: DC | PRN
  Administered 2015-03-05: 10 mL via INTRAVENOUS
  Filled 2015-03-05: qty 10

## 2015-03-05 NOTE — Patient Instructions (Signed)

## 2015-03-27 ENCOUNTER — Other Ambulatory Visit: Payer: Self-pay | Admitting: *Deleted

## 2015-03-27 DIAGNOSIS — C50419 Malignant neoplasm of upper-outer quadrant of unspecified female breast: Secondary | ICD-10-CM

## 2015-03-27 NOTE — Assessment & Plan Note (Signed)
1stage III a( T3 N1) invasive ductal carcinoma she is status post mastectomy with axillary lymph node dissection with one of 12 lymph nodes positive for metastatic disease. Tumor was ER negative PR negative HER-2/neu negative. She had her mastectomy on 11/03/2012 with axillary lymph node dissection performed on 12/08/2012. s/p 4 cycles of FEC that was given dose dense. She received this from 01/10/2013 through March 2014. s/p Taxol carboplatinum adjuvantly on a weekly basis starting on 03/17/2013 for a total of 12 weeks. S/P adjuvant post mastectomy radiation therapy completed 06/20/13 - 07/31/13.   #2 History of DVT in 02/2013 and was on Lovenox then Coumadin. This was stopped in 11/2013.  #3 Iron deficiency #4. Neuropathy Breast cancer Surveillance: 1. Breast exam 03/28/15 is normal 2. Mammogram 11/16/14 is normal  RTC in 6 months

## 2015-03-28 ENCOUNTER — Ambulatory Visit (HOSPITAL_BASED_OUTPATIENT_CLINIC_OR_DEPARTMENT_OTHER): Payer: 59 | Admitting: Hematology and Oncology

## 2015-03-28 ENCOUNTER — Telehealth: Payer: Self-pay | Admitting: *Deleted

## 2015-03-28 ENCOUNTER — Telehealth: Payer: Self-pay | Admitting: Hematology and Oncology

## 2015-03-28 ENCOUNTER — Other Ambulatory Visit: Payer: Self-pay | Admitting: *Deleted

## 2015-03-28 ENCOUNTER — Other Ambulatory Visit (HOSPITAL_BASED_OUTPATIENT_CLINIC_OR_DEPARTMENT_OTHER): Payer: 59

## 2015-03-28 VITALS — BP 130/87 | HR 92 | Temp 98.8°F | Resp 18 | Ht 64.0 in | Wt 213.3 lb

## 2015-03-28 DIAGNOSIS — Z853 Personal history of malignant neoplasm of breast: Secondary | ICD-10-CM

## 2015-03-28 DIAGNOSIS — G629 Polyneuropathy, unspecified: Secondary | ICD-10-CM

## 2015-03-28 DIAGNOSIS — C50412 Malignant neoplasm of upper-outer quadrant of left female breast: Secondary | ICD-10-CM

## 2015-03-28 DIAGNOSIS — Z86718 Personal history of other venous thrombosis and embolism: Secondary | ICD-10-CM

## 2015-03-28 DIAGNOSIS — C50419 Malignant neoplasm of upper-outer quadrant of unspecified female breast: Secondary | ICD-10-CM

## 2015-03-28 LAB — CBC WITH DIFFERENTIAL/PLATELET
BASO%: 1.3 % (ref 0.0–2.0)
Basophils Absolute: 0.1 10*3/uL (ref 0.0–0.1)
EOS%: 3.4 % (ref 0.0–7.0)
Eosinophils Absolute: 0.3 10*3/uL (ref 0.0–0.5)
HCT: 41.5 % (ref 34.8–46.6)
HGB: 13.6 g/dL (ref 11.6–15.9)
LYMPH%: 33.3 % (ref 14.0–49.7)
MCH: 28.9 pg (ref 25.1–34.0)
MCHC: 32.7 g/dL (ref 31.5–36.0)
MCV: 88.5 fL (ref 79.5–101.0)
MONO#: 0.4 10*3/uL (ref 0.1–0.9)
MONO%: 5.4 % (ref 0.0–14.0)
NEUT#: 4.4 10*3/uL (ref 1.5–6.5)
NEUT%: 56.6 % (ref 38.4–76.8)
Platelets: 265 10*3/uL (ref 145–400)
RBC: 4.69 10*6/uL (ref 3.70–5.45)
RDW: 14.9 % — ABNORMAL HIGH (ref 11.2–14.5)
WBC: 7.8 10*3/uL (ref 3.9–10.3)
lymph#: 2.6 10*3/uL (ref 0.9–3.3)

## 2015-03-28 LAB — COMPREHENSIVE METABOLIC PANEL (CC13)
ALBUMIN: 3.4 g/dL — AB (ref 3.5–5.0)
ALT: 16 U/L (ref 0–55)
AST: 20 U/L (ref 5–34)
Alkaline Phosphatase: 140 U/L (ref 40–150)
Anion Gap: 9 mEq/L (ref 3–11)
BUN: 6.1 mg/dL — ABNORMAL LOW (ref 7.0–26.0)
CHLORIDE: 108 meq/L (ref 98–109)
CO2: 24 meq/L (ref 22–29)
CREATININE: 0.7 mg/dL (ref 0.6–1.1)
Calcium: 8.9 mg/dL (ref 8.4–10.4)
Glucose: 133 mg/dl (ref 70–140)
Potassium: 3.8 mEq/L (ref 3.5–5.1)
SODIUM: 142 meq/L (ref 136–145)
TOTAL PROTEIN: 7.1 g/dL (ref 6.4–8.3)
Total Bilirubin: 0.32 mg/dL (ref 0.20–1.20)

## 2015-03-28 NOTE — Telephone Encounter (Signed)
Called Debbie with [facility] to obtain approval from Dr. Lucia Gaskins in reference to having port removed by IR. Debbie returned call and Dr. Lucia Gaskins is out of the office, will let me know when she knows something.

## 2015-03-28 NOTE — Telephone Encounter (Signed)
Called and left a message with the 10/16 appointment and ir will call with port removal  anne

## 2015-03-28 NOTE — Progress Notes (Signed)
Patient Care Team: Provider Not In System as PCP - General [name], MD as Consulting Physician (Hematology and Oncology) Thea Silversmith, MD as Consulting Physician (Radiation Oncology) Linda Hedges, DO as Consulting Physician (Obstetrics and Gynecology)  DIAGNOSIS: Primary cancer of upper outer quadrant of left female breast   Staging form: Breast, AJCC 7th Edition     Clinical: No stage assigned - Unsigned     Pathologic: Stage IIIA (T3, N1, cM0) - Signed by Deatra Robinson, MD on 02/12/2014   SUMMARY OF ONCOLOGIC HISTORY:   Primary cancer of upper outer quadrant of left female breast   09/05/2012 Initial Diagnosis Left Breast: 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 The ER was 2% PR 0%.    11/03/2012 Surgery Left Mastectomy: 15 cm IDC with DCIS 1/12 LN pos, T3N1 Triple Negative   11/17/2012 - 06/02/2013 Chemotherapy FEC x 4 foll by Taxol-carboplatin weekly x 12   06/30/2013 - 07/27/2013 Radiation Therapy Adjuvant XRT    CHIEF COMPLIANT: Follow-up of triple negative breast cancer on surveillance  INTERVAL HISTORY: Hannah Hale is a 49 year old with above-mentioned history of left breast cancer treated with mastectomy followed by adjuvant chemotherapy and radiation. She is currently on surveillance and observation. She reports no new problems or concerns. Previously she was also found to have DVT and iron deficiency anemia. She reports no further problems related to that. Does have mild neuropathy. She is exercising very actively and watching her diet and had lost 10 pounds. She is super excited about this.  REVIEW OF SYSTEMS:   Constitutional: Denies fevers, chills or abnormal weight loss Eyes: Denies blurriness of vision Ears, nose, mouth, throat, and face: Denies mucositis or sore throat Respiratory: Denies cough, dyspnea or wheezes Cardiovascular: Denies palpitation, chest discomfort or lower extremity swelling Gastrointestinal:  Denies nausea,  heartburn or change in bowel habits Skin: Denies abnormal skin rashes Lymphatics: Denies new lymphadenopathy or easy bruising Neurological:Denies numbness, tingling or new weaknesses Behavioral/Psych: Mood is stable, no new changes  Breast:  denies any pain or lumps or nodules in either breasts All other systems were reviewed with the patient and are negative.  I have reviewed the past medical history, past surgical history, social history and family history with the patient and they are unchanged from previous note.  ALLERGIES:  has No Known Allergies.  MEDICATIONS:  Current Outpatient Prescriptions  Medication Sig Dispense Refill  . diphenhydrAMINE (SOMINEX) 25 MG tablet Take 50 mg by mouth 2 (two) times daily as needed. For itching    . gabapentin (NEURONTIN) 100 MG capsule TAKE 3 CAPSULES BY MOUTH 3 TIMES A DAY 270 capsule 3  . UNABLE TO FIND Rx: [id number]- Custome Breast Prosthesis Dx: 174.9; Left Mastectomy 1 each 0  . venlafaxine (EFFEXOR) 37.5 MG tablet Take 1 tablet (37.5 mg total) by mouth 2 (two) times daily. 60 tablet 12   No current facility-administered medications for this visit.    PHYSICAL EXAMINATION: ECOG PERFORMANCE STATUS: 0 - Asymptomatic  Filed Vitals:   03/28/15 0850  BP: 130/87  Pulse: 92  Temp: 98.8 F (37.1 C)  Resp: 18   Filed Weights   03/28/15 0850  Weight: 213 lb 4.8 oz (96.752 kg)    GENERAL:alert, no distress and comfortable SKIN: skin color, texture, turgor are normal, no rashes or significant lesions EYES: normal, Conjunctiva are pink and non-injected, sclera clear OROPHARYNX:no exudate, no erythema and lips, buccal mucosa, and tongue normal  NECK: supple, thyroid normal size, non-tender, without  nodularity LYMPH:  no palpable lymphadenopathy in the cervical, axillary or inguinal LUNGS: clear to auscultation and percussion with normal breathing effort HEART: regular rate & rhythm and no murmurs and no lower extremity edema ABDOMEN:abdomen  soft, non-tender and normal bowel sounds Musculoskeletal:no cyanosis of digits and no clubbing  NEURO: alert & oriented x 3 with fluent speech, no focal motor/sensory deficits BREAST: No palpable masses or nodules in either right or left breasts. No palpable axillary supraclavicular or infraclavicular adenopathy no breast tenderness or nipple discharge. (exam performed in the presence of a chaperone)  LABORATORY DATA:  I have reviewed the data as listed   Chemistry      Component Value Date/Time   NA 142 09/13/2014 1221   NA 135 06/02/2013 1401   K 4.1 09/13/2014 1221   K 3.6 06/02/2013 1401   CL 105 06/02/2013 1401   CL 105 05/26/2013 0828   CO2 27 09/13/2014 1221   CO2 25 06/02/2013 1401   BUN 8.4 09/13/2014 1221   BUN 11 06/02/2013 1401   CREATININE 0.8 09/13/2014 1221   CREATININE 0.56 06/02/2013 1401      Component Value Date/Time   CALCIUM 9.6 09/13/2014 1221   CALCIUM 9.0 06/02/2013 1401   ALKPHOS 155* 09/13/2014 1221   ALKPHOS 111 06/02/2013 1401   AST 17 09/13/2014 1221   AST 34 06/02/2013 1401   ALT 20 09/13/2014 1221   ALT 30 06/02/2013 1401   BILITOT 0.31 09/13/2014 1221   BILITOT 0.2* 06/02/2013 1401       Lab Results  Component Value Date   WBC 7.8 03/28/2015   HGB 13.6 03/28/2015   HCT 41.5 03/28/2015   MCV 88.5 03/28/2015   PLT 265 03/28/2015   NEUTROABS 4.4 03/28/2015     RADIOGRAPHIC STUDIES: I have personally reviewed the radiology reports and agreed with their findings. Mammogram December 2015 is normal  ASSESSMENT & PLAN:  Primary cancer of upper outer quadrant of left female breast 1stage III a( T3 N1) invasive ductal carcinoma she is status post mastectomy with axillary lymph node dissection with one of 12 lymph nodes positive for metastatic disease. Tumor was ER negative PR negative HER-2/neu negative. She had her mastectomy on 11/03/2012 with axillary lymph node dissection performed on 12/08/2012. s/p 4 cycles of FEC that was given dose  dense. She received this from 01/10/2013 through March 2014. s/p Taxol carboplatinum adjuvantly on a weekly basis starting on 03/17/2013 for a total of 12 weeks. S/P adjuvant post mastectomy radiation therapy completed 06/20/13 - 07/31/13.   2 History of DVT in 02/2013 and was on Lovenox then Coumadin. This was stopped in 11/2013.  3 history of Iron deficiency: No evidence of fine deficiency on today's blood work 4. Neuropathy  Breast cancer Surveillance: 1. Breast exam 03/28/15 is normal 2. Mammogram 11/16/14 is normal  I will send a request to interventional radiology for port removal. Apparently she cannot go to see her surgeons because of a balance that was not paid by her.  RTC in 6 months  No orders of the defined types were placed in this encounter.   The patient has a good understanding of the overall plan. she agrees with it. She will call with any problems that may develop before her next visit here.   Rulon Eisenmenger, MD

## 2015-04-16 ENCOUNTER — Telehealth: Payer: Self-pay | Admitting: Hematology and Oncology

## 2015-04-16 NOTE — Telephone Encounter (Signed)
Patient called in to cancel todays flush and states she will call back to reschedule

## 2015-05-28 ENCOUNTER — Ambulatory Visit (HOSPITAL_BASED_OUTPATIENT_CLINIC_OR_DEPARTMENT_OTHER): Payer: 59

## 2015-05-28 VITALS — BP 143/73 | HR 89 | Temp 98.6°F

## 2015-05-28 DIAGNOSIS — Z853 Personal history of malignant neoplasm of breast: Secondary | ICD-10-CM

## 2015-05-28 DIAGNOSIS — Z452 Encounter for adjustment and management of vascular access device: Secondary | ICD-10-CM | POA: Diagnosis not present

## 2015-05-28 DIAGNOSIS — Z95828 Presence of other vascular implants and grafts: Secondary | ICD-10-CM

## 2015-05-28 MED ORDER — SODIUM CHLORIDE 0.9 % IJ SOLN
10.0000 mL | INTRAMUSCULAR | Status: DC | PRN
  Administered 2015-05-28: 10 mL via INTRAVENOUS
  Filled 2015-05-28: qty 10

## 2015-05-28 MED ORDER — HEPARIN SOD (PORK) LOCK FLUSH 100 UNIT/ML IV SOLN
500.0000 [IU] | Freq: Once | INTRAVENOUS | Status: AC
  Administered 2015-05-28: 500 [IU] via INTRAVENOUS
  Filled 2015-05-28: qty 5

## 2015-05-28 NOTE — Patient Instructions (Signed)

## 2015-07-09 ENCOUNTER — Ambulatory Visit (HOSPITAL_BASED_OUTPATIENT_CLINIC_OR_DEPARTMENT_OTHER): Payer: 59

## 2015-07-09 VITALS — BP 155/79 | HR 89 | Temp 98.6°F

## 2015-07-09 DIAGNOSIS — Z853 Personal history of malignant neoplasm of breast: Secondary | ICD-10-CM | POA: Diagnosis not present

## 2015-07-09 DIAGNOSIS — Z452 Encounter for adjustment and management of vascular access device: Secondary | ICD-10-CM

## 2015-07-09 DIAGNOSIS — Z95828 Presence of other vascular implants and grafts: Secondary | ICD-10-CM

## 2015-07-09 MED ORDER — SODIUM CHLORIDE 0.9 % IJ SOLN
10.0000 mL | INTRAMUSCULAR | Status: DC | PRN
  Administered 2015-07-09: 10 mL via INTRAVENOUS
  Filled 2015-07-09: qty 10

## 2015-07-09 MED ORDER — HEPARIN SOD (PORK) LOCK FLUSH 100 UNIT/ML IV SOLN
500.0000 [IU] | Freq: Once | INTRAVENOUS | Status: AC
  Administered 2015-07-09: 500 [IU] via INTRAVENOUS
  Filled 2015-07-09: qty 5

## 2015-07-09 NOTE — Patient Instructions (Signed)

## 2015-09-25 ENCOUNTER — Telehealth: Payer: Self-pay | Admitting: Hematology and Oncology

## 2015-09-25 NOTE — Telephone Encounter (Signed)
Called and left a message with a new appointment as she left a message that she will have to be at work at 11:00

## 2015-09-26 NOTE — Assessment & Plan Note (Signed)
1stage III a( T3 N1) invasive ductal carcinoma she is status post mastectomy with axillary lymph node dissection with one of 12 lymph nodes positive for metastatic disease. Tumor was ER negative PR negative HER-2/neu negative. She had her mastectomy on 11/03/2012 with axillary lymph node dissection performed on 12/08/2012. s/p 4 cycles of FEC that was given dose dense. She received this from 01/10/2013 through March 2014. s/p Taxol carboplatinum adjuvantly on a weekly basis starting on 03/17/2013 for a total of 12 weeks. S/P adjuvant post mastectomy radiation therapy completed 06/20/13 - 07/31/13.   2 History of DVT in 02/2013 and was on Lovenox then Coumadin. This was stopped in 11/2013.  3 history of Iron deficiency: No evidence of fine deficiency on today's blood work 4. Neuropathy  Breast cancer Surveillance: 1. Breast exam 09/27/15 is normal 2. Mammogram 11/16/14 is normal  Port removed by IR  RTC in 6 months

## 2015-09-27 ENCOUNTER — Ambulatory Visit (HOSPITAL_BASED_OUTPATIENT_CLINIC_OR_DEPARTMENT_OTHER): Payer: 59 | Admitting: Hematology and Oncology

## 2015-09-27 ENCOUNTER — Encounter: Payer: Self-pay | Admitting: Hematology and Oncology

## 2015-09-27 ENCOUNTER — Ambulatory Visit: Payer: 59 | Admitting: Hematology and Oncology

## 2015-09-27 ENCOUNTER — Telehealth: Payer: Self-pay | Admitting: Hematology and Oncology

## 2015-09-27 VITALS — BP 136/79 | HR 79 | Temp 97.9°F | Resp 18 | Ht 64.0 in | Wt 200.6 lb

## 2015-09-27 DIAGNOSIS — Z853 Personal history of malignant neoplasm of breast: Secondary | ICD-10-CM

## 2015-09-27 DIAGNOSIS — Z86718 Personal history of other venous thrombosis and embolism: Secondary | ICD-10-CM | POA: Diagnosis not present

## 2015-09-27 DIAGNOSIS — I82592 Chronic embolism and thrombosis of other specified deep vein of left lower extremity: Secondary | ICD-10-CM

## 2015-09-27 DIAGNOSIS — I82402 Acute embolism and thrombosis of unspecified deep veins of left lower extremity: Secondary | ICD-10-CM

## 2015-09-27 DIAGNOSIS — C50412 Malignant neoplasm of upper-outer quadrant of left female breast: Secondary | ICD-10-CM

## 2015-09-27 NOTE — Telephone Encounter (Signed)
Appointments made and avs printed and mailed to patient °

## 2015-09-27 NOTE — Progress Notes (Signed)
Patient Care Team: Provider Not In System as PCP - General [name], MD as Consulting Physician (Hematology and Oncology) Thea Silversmith, MD as Consulting Physician (Radiation Oncology) Linda Hedges, DO as Consulting Physician (Obstetrics and Gynecology)  DIAGNOSIS: Primary cancer of upper outer quadrant of left female breast [facility])   Staging form: Breast, AJCC 7th Edition     Clinical: No stage assigned - Unsigned     Pathologic: Stage IIIA (T3, N1, cM0) - Signed by Deatra Robinson, MD on 02/12/2014   SUMMARY OF ONCOLOGIC HISTORY:   Primary cancer of upper outer quadrant of left female breast ([place])   09/05/2012 Initial Diagnosis Left Breast: 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 The ER was 2% PR 0%.    11/03/2012 Surgery Left Mastectomy: 15 cm IDC with DCIS 1/12 LN pos, T3N1 Triple Negative   11/17/2012 - 06/02/2013 Chemotherapy FEC x 4 foll by Taxol-carboplatin weekly x 12   06/30/2013 - 07/27/2013 Radiation Therapy Adjuvant XRT    CHIEF COMPLIANT: follow-up of triple negative breast cancer  INTERVAL HISTORY: Hannah Hale is a 49 year old with above-mentioned history of left breast cancer treated with mastectomy followed by adjuvant chemotherapy and radiation. She is here today for routine surveillance. She reports no major problems or concerns. The port has been removed by interventional radiology. She also has a history of deficiency anemia. Denies any fatigue or shortness of breath. She works at assisted living facility. Since May she had lost 25 pounds. She makes sure that she exercises every day. She also does not eat a lot of food after 6 PM.  REVIEW OF SYSTEMS:   Constitutional: Denies fevers, chills or abnormal weight loss Eyes: Denies blurriness of vision Ears, nose, mouth, throat, and face: Denies mucositis or sore throat Respiratory: Denies cough, dyspnea or wheezes Cardiovascular: Denies palpitation, chest discomfort or lower extremity  swelling Gastrointestinal:  Denies nausea, heartburn or change in bowel habits Skin: Denies abnormal skin rashes Lymphatics: Denies new lymphadenopathy or easy bruising Neurological:Denies numbness, tingling or new weaknesses Behavioral/Psych: Mood is stable, no new changes  Breast:  denies any pain or lumps or nodules in either breasts All other systems were reviewed with the patient and are negative.  I have reviewed the past medical history, past surgical history, social history and family history with the patient and they are unchanged from previous note.  ALLERGIES:  has No Known Allergies.  MEDICATIONS:  Current Outpatient Prescriptions  Medication Sig Dispense Refill  . diphenhydrAMINE (SOMINEX) 25 MG tablet Take 50 mg by mouth 2 (two) times daily as needed. For itching    . gabapentin (NEURONTIN) 100 MG capsule TAKE 3 CAPSULES BY MOUTH 3 TIMES A DAY 270 capsule 3  . UNABLE TO FIND Rx: [id number]- Custome Breast Prosthesis Dx: 174.9; Left Mastectomy 1 each 0  . venlafaxine (EFFEXOR) 37.5 MG tablet Take 1 tablet (37.5 mg total) by mouth 2 (two) times daily. 60 tablet 12   No current facility-administered medications for this visit.    PHYSICAL EXAMINATION: ECOG PERFORMANCE STATUS: 0 - Asymptomatic  Filed Vitals:   09/27/15 0855  BP: 136/79  Pulse: 79  Temp: 97.9 F (36.6 C)  Resp: 18   Filed Weights   09/27/15 0855  Weight: 200 lb 9.6 oz (90.992 kg)    GENERAL:alert, no distress and comfortable SKIN: skin color, texture, turgor are normal, no rashes or significant lesions EYES: normal, Conjunctiva are pink and non-injected, sclera clear OROPHARYNX:no exudate, no erythema and lips, buccal  mucosa, and tongue normal  NECK: supple, thyroid normal size, non-tender, without nodularity LYMPH:  no palpable lymphadenopathy in the cervical, axillary or inguinal LUNGS: clear to auscultation and percussion with normal breathing effort HEART: regular rate & rhythm and no murmurs  and no lower extremity edema ABDOMEN:abdomen soft, non-tender and normal bowel sounds Musculoskeletal:no cyanosis of digits and no clubbing  NEURO: alert & oriented x 3 with fluent speech, no focal motor/sensory deficits BREAST: No palpable masses or nodules in either right or left breasts. No palpable axillary supraclavicular or infraclavicular adenopathy no breast tenderness or nipple discharge. (exam performed in the presence of a chaperone)  LABORATORY DATA:  I have reviewed the data as listed   Chemistry      Component Value Date/Time   NA 142 03/28/2015 0832   NA 135 06/02/2013 1401   K 3.8 03/28/2015 0832   K 3.6 06/02/2013 1401   CL 105 06/02/2013 1401   CL 105 05/26/2013 0828   CO2 24 03/28/2015 0832   CO2 25 06/02/2013 1401   BUN 6.1* 03/28/2015 0832   BUN 11 06/02/2013 1401   CREATININE 0.7 03/28/2015 0832   CREATININE 0.56 06/02/2013 1401      Component Value Date/Time   CALCIUM 8.9 03/28/2015 0832   CALCIUM 9.0 06/02/2013 1401   ALKPHOS 140 03/28/2015 0832   ALKPHOS 111 06/02/2013 1401   AST 20 03/28/2015 0832   AST 34 06/02/2013 1401   ALT 16 03/28/2015 0832   ALT 30 06/02/2013 1401   BILITOT 0.32 03/28/2015 0832   BILITOT 0.2* 06/02/2013 1401       Lab Results  Component Value Date   WBC 7.8 03/28/2015   HGB 13.6 03/28/2015   HCT 41.5 03/28/2015   MCV 88.5 03/28/2015   PLT 265 03/28/2015   NEUTROABS 4.4 03/28/2015   ASSESSMENT & PLAN:  Primary cancer of upper outer quadrant of left female breast 1stage III a( T3 N1) invasive ductal carcinoma she is status post mastectomy with axillary lymph node dissection with one of 12 lymph nodes positive for metastatic disease. Tumor was ER negative PR negative HER-2/neu negative. She had her mastectomy on 11/03/2012 with axillary lymph node dissection performed on 12/08/2012. s/p 4 cycles of FEC that was given dose dense. She received this from 01/10/2013 through March 2014. s/p Taxol carboplatinum adjuvantly on a  weekly basis starting on 03/17/2013 for a total of 12 weeks. S/P adjuvant post mastectomy radiation therapy completed 06/20/13 - 07/31/13.   2 History of DVT in 02/2013 and was on Lovenox then Coumadin. This was stopped in 11/2013.  3 history of Iron deficiency: blood work was not done today 4. Neuropathy: Improved  Breast cancer Surveillance: 1. Breast exam 09/27/15 is normal 2. Mammogram 11/16/14 is normal  Port will need to be removed.  RTC in 1 year   No orders of the defined types were placed in this encounter.   The patient has a good understanding of the overall plan. she agrees with it. she will call with any problems that may develop before the next visit here.   Rulon Eisenmenger, MD 09/27/2015

## 2016-05-26 ENCOUNTER — Other Ambulatory Visit: Payer: Self-pay | Admitting: *Deleted

## 2016-05-26 DIAGNOSIS — C50412 Malignant neoplasm of upper-outer quadrant of left female breast: Secondary | ICD-10-CM

## 2016-05-26 MED ORDER — GABAPENTIN 300 MG PO CAPS: 300.0000 mg | ORAL_CAPSULE | Freq: Three times a day (TID) | ORAL | Status: DC

## 2016-05-26 MED FILL — GABAPENTIN 300 MG CAPSULE: 300 | 30 days supply | Qty: 90 | Fill #0

## 2016-08-19 MED FILL — GABAPENTIN 300 MG CAPSULE: 300 | 30 days supply | Qty: 90 | Fill #1

## 2016-09-25 ENCOUNTER — Ambulatory Visit (HOSPITAL_BASED_OUTPATIENT_CLINIC_OR_DEPARTMENT_OTHER): Payer: 59

## 2016-09-25 ENCOUNTER — Ambulatory Visit (HOSPITAL_BASED_OUTPATIENT_CLINIC_OR_DEPARTMENT_OTHER): Payer: 59 | Admitting: Hematology and Oncology

## 2016-09-25 ENCOUNTER — Encounter: Payer: Self-pay | Admitting: Hematology and Oncology

## 2016-09-25 DIAGNOSIS — Z853 Personal history of malignant neoplasm of breast: Secondary | ICD-10-CM

## 2016-09-25 DIAGNOSIS — Z86718 Personal history of other venous thrombosis and embolism: Secondary | ICD-10-CM | POA: Diagnosis not present

## 2016-09-25 DIAGNOSIS — Z862 Personal history of diseases of the blood and blood-forming organs and certain disorders involving the immune mechanism: Secondary | ICD-10-CM

## 2016-09-25 DIAGNOSIS — C50412 Malignant neoplasm of upper-outer quadrant of left female breast: Secondary | ICD-10-CM

## 2016-09-25 LAB — RESEARCH LABS

## 2016-09-25 NOTE — Progress Notes (Signed)
Patient Care Team: Provider Not In System as PCP - General [name], MD as Consulting Physician (Hematology and Oncology) Thea Silversmith, MD as Consulting Physician (Radiation Oncology) Linda Hedges, DO as Consulting Physician (Obstetrics and Gynecology)  DIAGNOSIS: Primary cancer of upper outer quadrant of left female breast [facility])   Staging form: Breast, AJCC 7th Edition   - Clinical: No stage assigned - Unsigned   - Pathologic: Stage IIIA (T3, N1, cM0) - Signed by Deatra Robinson, MD on 02/12/2014  SUMMARY OF ONCOLOGIC HISTORY:   Primary cancer of upper outer quadrant of left female breast ([place])   09/05/2012 Initial Diagnosis    Left Breast: 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 The ER was 2% PR 0%.       11/03/2012 Surgery    Left Mastectomy: 15 cm IDC with DCIS 1/12 LN pos, T3N1 Triple Negative      11/17/2012 - 06/02/2013 Chemotherapy    FEC x 4 foll by Taxol-carboplatin weekly x 12      06/30/2013 - 07/27/2013 Radiation Therapy    Adjuvant XRT       CHIEF COMPLIANT: Surveillance of triple negative left breast cancer  INTERVAL HISTORY: Hannah Hale is a 50 year old with above-mentioned history of left breast triple negative cancer diagnosed in 2013 followed by mastectomy and adjuvant chemotherapy followed by radiation. She is currently on surveillance. She reports no pain or discomfort in the breast or axilla. She denies any lumps or nodules. She has not been exercising routinely.  REVIEW OF SYSTEMS:   Constitutional: Denies fevers, chills or abnormal weight loss Eyes: Denies blurriness of vision Ears, nose, mouth, throat, and face: Denies mucositis or sore throat Respiratory: Denies cough, dyspnea or wheezes Cardiovascular: Denies palpitation, chest discomfort Gastrointestinal:  Denies nausea, heartburn or change in bowel habits Skin: Denies abnormal skin rashes Lymphatics: Denies new lymphadenopathy or easy  bruising Neurological:Denies numbness, tingling or new weaknesses Behavioral/Psych: Mood is stable, no new changes  Extremities: No lower extremity edema Breast:  denies any pain or lumps or nodules in either breasts All other systems were reviewed with the patient and are negative.  I have reviewed the past medical history, past surgical history, social history and family history with the patient and they are unchanged from previous note.  ALLERGIES:  has No Known Allergies.  MEDICATIONS:  Current Outpatient Prescriptions  Medication Sig Dispense Refill  . diphenhydrAMINE (SOMINEX) 25 MG tablet Take 50 mg by mouth 2 (two) times daily as needed. For itching    . gabapentin (NEURONTIN) 300 MG capsule Take 1 capsule (300 mg total) by mouth 3 (three) times daily. 270 capsule 3  . UNABLE TO FIND Rx: W1191- Custome Breast Prosthesis Dx: 174.9; Left Mastectomy 1 each 0  . venlafaxine (EFFEXOR) 37.5 MG tablet Take 1 tablet (37.5 mg total) by mouth 2 (two) times daily. 60 tablet 12   No current facility-administered medications for this visit.     PHYSICAL EXAMINATION: ECOG PERFORMANCE STATUS: 1 - Symptomatic but completely ambulatory  Vitals:   09/25/16 0841  BP: 122/76  Pulse: 73  Resp: 18  Temp: 97.7 F (36.5 C)   Filed Weights   09/25/16 0841  Weight: 208 lb (94.3 kg)    GENERAL:alert, no distress and comfortable SKIN: skin color, texture, turgor are normal, no rashes or significant lesions EYES: normal, Conjunctiva are pink and non-injected, sclera clear OROPHARYNX:no exudate, no erythema and lips, buccal mucosa, and tongue normal  NECK: supple, thyroid normal  size, non-tender, without nodularity LYMPH:  no palpable lymphadenopathy in the cervical, axillary or inguinal LUNGS: clear to auscultation and percussion with normal breathing effort HEART: regular rate & rhythm and no murmurs and no lower extremity edema ABDOMEN:abdomen soft, non-tender and normal bowel  sounds MUSCULOSKELETAL:no cyanosis of digits and no clubbing  NEURO: alert & oriented x 3 with fluent speech, no focal motor/sensory deficits EXTREMITIES: No lower extremity edema BREAST:No palpable lumps or nodules in the right breast. Left mastectomy scar is intact and without any palpable nodules. No axillary lymphadenopathy. (exam performed in the presence of a chaperone)  LABORATORY DATA:  I have reviewed the data as listed   Chemistry      Component Value Date/Time   NA 142 03/28/2015 0832   K 3.8 03/28/2015 0832   CL 105 06/02/2013 1401   CL 105 05/26/2013 0828   CO2 24 03/28/2015 0832   BUN 6.1 (L) 03/28/2015 0832   CREATININE 0.7 03/28/2015 0832      Component Value Date/Time   CALCIUM 8.9 03/28/2015 0832   ALKPHOS 140 03/28/2015 0832   AST 20 03/28/2015 0832   ALT 16 03/28/2015 0832   BILITOT 0.32 03/28/2015 0832      Lab Results  Component Value Date   WBC 7.8 03/28/2015   HGB 13.6 03/28/2015   HCT 41.5 03/28/2015   MCV 88.5 03/28/2015   PLT 265 03/28/2015   NEUTROABS 4.4 03/28/2015    ASSESSMENT & PLAN:  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 Tumor was ER negative PR negative HER-2/neu negative. She had her mastectomy on 11/03/2012 with axillary lymph node dissection performed on 12/08/2012. s/p 4 cycles of FEC that was given dose dense. She received this from 01/10/2013 through March 2014. s/p Taxol carboplatinum adjuvantly on a weekly basis starting on 03/17/2013 for a total of 12 weeks. S/P adjuvant post mastectomy radiation therapy completed 06/20/13 - 07/31/13.   2 History of DVT in 02/2013 and was on Lovenox then Coumadin. This was stopped in 11/2013.  3 history of Iron deficiency: blood work was not done today 4. Neuropathy: Improved  Port was not removed  because apparently would cause $1100 of the surgery center to get the port removed.  Breast cancer Surveillance: 1. Breast exam 09/25/16 is normal 2. Mammogram 11/16/14 is normal, mammograms will need to be done.  ------------------------------------------------------------------------------------------------------------------- Consent for research study: Procurement of human bio specimens for the discovery and validation of biomarkers for the prediction, diagnosis and management of disease  Met with patient (alone) for 15 minutes to review the pathology specimen consent form and HIPAA. Study involves a one-time blood draw. Patient was told of the voluntary nature of the study, risks, benefits, and alternatives to participation. All of the patient's questions were answered, and the patient agreed to participate in the study. The consent and HIPAA forms were signed and dated by the patient. All eligibility criteria have been met and patient has been enrolled in the pathology procurement study. Research staff will provide the patient with a copy of the consent form and HIPAA document and give the patient a gift card after the blood has been obtained. --------------------------------------------------------------------------------------------------------------------  RTC in 1 year   No orders of the defined types were placed in this encounter.  The patient has a good understanding of the overall  plan. she agrees with it. she will call with any problems that may develop before the next visit here.   Rulon Eisenmenger, MD 09/25/16

## 2016-09-25 NOTE — Assessment & Plan Note (Signed)
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 Tumor was ER negative PR negative HER-2/neu negative. She had her mastectomy on 11/03/2012 with axillary lymph node dissection performed on 12/08/2012. s/p 4 cycles of FEC that was given dose dense. She received this from 01/10/2013 through March 2014. s/p Taxol carboplatinum adjuvantly on a weekly basis starting on 03/17/2013 for a total of 12 weeks. S/P adjuvant post mastectomy radiation therapy completed 06/20/13 - 07/31/13.   2 History of DVT in 02/2013 and was on Lovenox then Coumadin. This was stopped in 11/2013.  3 history of Iron deficiency: blood work was not done today 4. Neuropathy: Improved  Breast cancer Surveillance: 1. Breast exam 09/25/16 is normal 2. Mammogram 11/16/14 is normal, mammograms will need to be done.  RTC in 1 year

## 2016-12-02 ENCOUNTER — Other Ambulatory Visit: Payer: Self-pay | Admitting: Hematology and Oncology

## 2016-12-25 ENCOUNTER — Other Ambulatory Visit: Payer: Self-pay | Admitting: Hematology and Oncology

## 2016-12-25 ENCOUNTER — Ambulatory Visit
Admission: RE | Admit: 2016-12-25 | Discharge: 2016-12-25 | Disposition: A | Discharge status: Home / Self Care | Payer: 59 | Source: Ambulatory Visit | Attending: Hematology and Oncology | Admitting: Hematology and Oncology

## 2016-12-25 DIAGNOSIS — C50412 Malignant neoplasm of upper-outer quadrant of left female breast: Secondary | ICD-10-CM

## 2016-12-25 DIAGNOSIS — Z1231 Encounter for screening mammogram for malignant neoplasm of breast: Secondary | ICD-10-CM

## 2017-09-22 ENCOUNTER — Telehealth: Payer: Self-pay | Admitting: *Deleted

## 2017-09-22 NOTE — Telephone Encounter (Signed)
"  Just received a call about an appointment there.  Can you check who it's for me or my husband?  If it's for me, I have to cancel.  I will check my schedule and call back to reschedule."  Routing call information to collaborative nurse and provider for review.  Further scheduling, patient communication through collaborative nurse.

## 2017-09-22 NOTE — Telephone Encounter (Signed)
Thank you. I will send scheduling message to reschedule pt. Its a yearly follow up.

## 2017-09-24 ENCOUNTER — Ambulatory Visit: Payer: 59 | Admitting: Hematology and Oncology

## 2017-09-24 ENCOUNTER — Telehealth: Payer: Self-pay | Admitting: Hematology and Oncology

## 2017-09-24 NOTE — Assessment & Plan Note (Deleted)
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 Tumor wasER negative PR negative HER-2/neu negative.She had her mastectomy on 11/03/2012 with axillary lymph node dissection performed on 12/08/2012. s/p 4 cycles of FEC that was given dose dense. She received this from 01/10/2013 through March 2014. s/p Taxol carboplatinum adjuvantly on a weekly basis starting on 03/17/2013 for a total of 12 weeks. S/P adjuvant post mastectomy radiation therapy completed 06/20/13 - 07/31/13.   2 History of DVT in 02/2013 and was on Lovenox then Coumadin. This was stopped in 11/2013.  3 history of Iron deficiency: blood work was not done today 4. Neuropathy: Improved  Port was not removed because apparently would cause $1100 of the surgery center to get the port removed.  Breast cancer Surveillance: 1. Breast exam 09/24/2017  is normal 2. Mammogram 12/25/2016 is benign, breast density C  Return to clinic in 1 year for follow-up

## 2017-09-24 NOTE — Telephone Encounter (Signed)
R/s appt per 10/12 sch message - patient is aware  Of appt date and time.

## 2017-09-30 ENCOUNTER — Telehealth: Payer: Self-pay | Admitting: Hematology and Oncology

## 2017-09-30 ENCOUNTER — Ambulatory Visit (HOSPITAL_BASED_OUTPATIENT_CLINIC_OR_DEPARTMENT_OTHER): Payer: BLUE CROSS/BLUE SHIELD | Admitting: Hematology and Oncology

## 2017-09-30 DIAGNOSIS — Z86718 Personal history of other venous thrombosis and embolism: Secondary | ICD-10-CM

## 2017-09-30 DIAGNOSIS — G629 Polyneuropathy, unspecified: Secondary | ICD-10-CM | POA: Diagnosis not present

## 2017-09-30 DIAGNOSIS — Z853 Personal history of malignant neoplasm of breast: Secondary | ICD-10-CM | POA: Diagnosis not present

## 2017-09-30 DIAGNOSIS — C50412 Malignant neoplasm of upper-outer quadrant of left female breast: Secondary | ICD-10-CM

## 2017-09-30 NOTE — Assessment & Plan Note (Addendum)
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 Tumor wasER negative PR negative HER-2/neu negative.She had her mastectomy on 11/03/2012 with axillary lymph node dissection performed on 12/08/2012. s/p 4 cycles of FEC that was given dose dense. She received this from 01/10/2013 through March 2014. s/p Taxol carboplatinum adjuvantly on a weekly basis starting on 03/17/2013 for a total of 12 weeks. S/P adjuvant post mastectomy radiation therapy completed 06/20/13 - 07/31/13.   2 History of DVT in 02/2013 and was on Lovenox then Coumadin. This was stopped in 11/2013.  3 history of Iron deficiency: blood work was not done today 4. Neuropathy: Improved  Port was not removed because apparently would cause $1100 of the surgery center to get the port removed.  Breast cancer Surveillance: 1. Breast exam 09/30/2017 is normal 2. Mammogram 12/25/2016 is normal, breast density category C  Return to clinic in 1 year for follow-up

## 2017-09-30 NOTE — Progress Notes (Signed)
Patient Care Team: System, Provider Not In as PCP - General [name], MD (Inactive) as Consulting Physician (Hematology and Oncology) Thea Silversmith, MD (Inactive) as Consulting Physician (Radiation Oncology) Linda Hedges, DO as Consulting Physician (Obstetrics and Gynecology)  DIAGNOSIS:  Encounter Diagnosis  Name Primary?  . Primary cancer of upper outer quadrant of left female breast ([place])     SUMMARY OF ONCOLOGIC HISTORY:   Primary cancer of upper outer quadrant of left female breast ([place])   09/05/2012 Initial Diagnosis    Left Breast: 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 The ER was 2% PR 0%.       11/03/2012 Surgery    Left Mastectomy: 15 cm IDC with DCIS 1/12 LN pos, T3N1 Triple Negative      11/17/2012 - 06/02/2013 Chemotherapy    FEC x 4 foll by Taxol-carboplatin weekly x 12      06/30/2013 - 07/27/2013 Radiation Therapy    Adjuvant XRT       CHIEF COMPLIANT: surveillance of breast cancer  INTERVAL HISTORY: Hannah Hale is a 69 with above-mentioned history left breast cancer on treatment with mastectomy followed by adjuvant chemotherapy and radiation and is currently on surveillance. She was found to be triple negative. She denies any new problems or concerns.she denies any lumps or nodules. Right breast mammograms have been normal in January.  REVIEW OF SYSTEMS:   Constitutional: Denies fevers, chills or abnormal weight loss Eyes: Denies blurriness of vision Ears, nose, mouth, throat, and face: Denies mucositis or sore throat Respiratory: Denies cough, dyspnea or wheezes Cardiovascular: Denies palpitation, chest discomfort Gastrointestinal:  Denies nausea, heartburn or change in bowel habits Skin: Denies abnormal skin rashes Lymphatics: Denies new lymphadenopathy or easy bruising Neurological:Denies numbness, tingling or new weaknesses Behavioral/Psych: Mood is stable, no new changes  Extremities: No lower  extremity edema Breast:  denies any pain or lumps or nodules in right breast or left chest wall or axilla All other systems were reviewed with the patient and are negative.  I have reviewed the past medical history, past surgical history, social history and family history with the patient and they are unchanged from previous note.  ALLERGIES:  has No Known Allergies.  MEDICATIONS:  Current Outpatient Prescriptions  Medication Sig Dispense Refill  . diphenhydrAMINE (SOMINEX) 25 MG tablet Take 50 mg by mouth 2 (two) times daily as needed. For itching    . gabapentin (NEURONTIN) 300 MG capsule Take 1 capsule (300 mg total) by mouth 3 (three) times daily. 270 capsule 3  . UNABLE TO FIND Rx: D2202- Custome Breast Prosthesis Dx: 174.9; Left Mastectomy 1 each 0  . venlafaxine (EFFEXOR) 37.5 MG tablet Take 1 tablet (37.5 mg total) by mouth 2 (two) times daily. 60 tablet 12   No current facility-administered medications for this visit.     PHYSICAL EXAMINATION: ECOG PERFORMANCE STATUS: 1 - Symptomatic but completely ambulatory  Vitals:   09/30/17 0905  BP: 119/65  Pulse: 75  Resp: 16  Temp: 98.2 F (36.8 C)  SpO2: 100%   Filed Weights   09/30/17 0905  Weight: 200 lb 8 oz (90.9 kg)    GENERAL:alert, no distress and comfortable SKIN: skin color, texture, turgor are normal, no rashes or significant lesions EYES: normal, Conjunctiva are pink and non-injected, sclera clear OROPHARYNX:no exudate, no erythema and lips, buccal mucosa, and tongue normal  NECK: supple, thyroid normal size, non-tender, without nodularity LYMPH:  no palpable lymphadenopathy in the cervical, axillary or inguinal  LUNGS: clear to auscultation and percussion with normal breathing effort HEART: regular rate & rhythm and no murmurs and no lower extremity edema ABDOMEN:abdomen soft, non-tender and normal bowel sounds MUSCULOSKELETAL:no cyanosis of digits and no clubbing  NEURO: alert & oriented x 3 with fluent  speech, no focal motor/sensory deficits EXTREMITIES: No lower extremity edema BREAST:no palpable lumps or nodules in the right breasts her left chest wall or axilla history (exam performed in the presence of a chaperone)  LABORATORY DATA:  I have reviewed the data as listed   Chemistry      Component Value Date/Time   NA 142 03/28/2015 0832   K 3.8 03/28/2015 0832   CL 105 06/02/2013 1401   CL 105 05/26/2013 0828   CO2 24 03/28/2015 0832   BUN 6.1 (L) 03/28/2015 0832   CREATININE 0.7 03/28/2015 0832      Component Value Date/Time   CALCIUM 8.9 03/28/2015 0832   ALKPHOS 140 03/28/2015 0832   AST 20 03/28/2015 0832   ALT 16 03/28/2015 0832   BILITOT 0.32 03/28/2015 0832       Lab Results  Component Value Date   WBC 7.8 03/28/2015   HGB 13.6 03/28/2015   HCT 41.5 03/28/2015   MCV 88.5 03/28/2015   PLT 265 03/28/2015   NEUTROABS 4.4 03/28/2015    ASSESSMENT & PLAN:  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 Tumor wasER negative PR negative HER-2/neu negative.She had her mastectomy on 11/03/2012 with axillary lymph node dissection performed on 12/08/2012. s/p 4 cycles of FEC that was given dose dense. She received this from 01/10/2013 through March 2014. s/p Taxol carboplatinum adjuvantly on a weekly basis starting on 03/17/2013 for a total of 12 weeks. S/P adjuvant post mastectomy radiation therapy completed 06/20/13 - 07/31/13.   2 History of DVT in 02/2013 and was on Lovenox then Coumadin. This was stopped in 11/2013.  3 history of Iron deficiency: blood work was not done today 4. Neuropathy: Improved  Port was not removed because apparently would cause $1100 of the surgery center to get the port removed.  Breast cancer Surveillance: 1. Breast exam 09/30/2017 is normal 2.  Mammogram 12/25/2016 is normal, breast density category C  Return to clinic in 1 year for follow-up   I spent 25 minutes talking to the patient of which more than half was spent in counseling and coordination of care.  No orders of the defined types were placed in this encounter.  The patient has a good understanding of the overall plan. she agrees with it. she will call with any problems that may develop before the next visit here.   Rulon Eisenmenger, MD 09/30/17

## 2017-09-30 NOTE — Telephone Encounter (Signed)
Scheduled appt per 10/18 los. Patient did not want avs or calendar.

## 2018-09-22 ENCOUNTER — Other Ambulatory Visit: Payer: Self-pay | Admitting: Physician Assistant

## 2018-09-22 DIAGNOSIS — Z13228 Encounter for screening for other metabolic disorders: Secondary | ICD-10-CM | POA: Diagnosis not present

## 2018-09-22 DIAGNOSIS — M79662 Pain in left lower leg: Secondary | ICD-10-CM | POA: Diagnosis not present

## 2018-09-22 DIAGNOSIS — M25559 Pain in unspecified hip: Secondary | ICD-10-CM | POA: Diagnosis not present

## 2018-09-22 DIAGNOSIS — M25562 Pain in left knee: Secondary | ICD-10-CM

## 2018-09-22 DIAGNOSIS — M79669 Pain in unspecified lower leg: Secondary | ICD-10-CM | POA: Diagnosis not present

## 2018-09-22 DIAGNOSIS — R739 Hyperglycemia, unspecified: Secondary | ICD-10-CM | POA: Diagnosis not present

## 2018-10-04 ENCOUNTER — Ambulatory Visit: Payer: BLUE CROSS/BLUE SHIELD | Admitting: Hematology and Oncology

## 2018-10-05 ENCOUNTER — Other Ambulatory Visit: Payer: Self-pay | Admitting: Hematology and Oncology

## 2018-10-05 DIAGNOSIS — Z1231 Encounter for screening mammogram for malignant neoplasm of breast: Secondary | ICD-10-CM

## 2018-10-20 DIAGNOSIS — Z23 Encounter for immunization: Secondary | ICD-10-CM | POA: Diagnosis not present

## 2018-10-20 DIAGNOSIS — Z720 Tobacco use: Secondary | ICD-10-CM | POA: Diagnosis not present

## 2018-10-20 DIAGNOSIS — I10 Essential (primary) hypertension: Secondary | ICD-10-CM | POA: Diagnosis not present

## 2018-10-20 DIAGNOSIS — E1169 Type 2 diabetes mellitus with other specified complication: Secondary | ICD-10-CM | POA: Diagnosis not present

## 2018-11-08 NOTE — Progress Notes (Signed)
Patient Care Team: System, Provider Not In as PCP - General [name], MD (Inactive) as Consulting Physician (Hematology and Oncology) Thea Silversmith, MD as Consulting Physician (Radiation Oncology) Linda Hedges, DO as Consulting Physician (Obstetrics and Gynecology)  DIAGNOSIS:    ICD-10-CM   1. 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 The ER was 2% PR 0%.     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 with above-mentioned history of left breast cancer on treatment with mastectomy followed by adjuvant chemotherapy and radiation and is currently on surveillance. I last saw the patient a year ago. Her most recent mammogram was 11/15/18 and showed no evidence of malignancy. She presents to the clinic today alone. She denies any pain in her breasts. She notes her dad recently went through treatment for lung cancer and she has been caring for him. She notes she has been losing weight recently by watching what she eats. She denies any further problems with blood clots or her iron levels. She reviewed her medication list with me. She reports she stopped taking gabapentin and has had occasional hot flashes that are manageable.   REVIEW OF SYSTEMS:   Constitutional: Denies fevers, chills or abnormal weight loss (+) occasional hot flashes Eyes: Denies blurriness of vision Ears, nose, mouth, throat, and face: Denies  mucositis or sore throat Respiratory: Denies cough, dyspnea or wheezes Cardiovascular: Denies palpitation, chest discomfort Gastrointestinal:  Denies nausea, heartburn or change in bowel habits Skin: Denies abnormal skin rashes Lymphatics: Denies new lymphadenopathy or easy bruising Neurological:Denies numbness, tingling or new weaknesses Behavioral/Psych: Mood is stable, no new changes  Extremities: No lower extremity edema Breast: Left mastectomy, boils underneath the right breast healed All other systems were reviewed with the patient and are negative.  I have reviewed the past medical history, past surgical history, social history and family history with the patient and they are unchanged from previous note.  ALLERGIES:  has No Known Allergies.  MEDICATIONS:  Current Outpatient Medications  Medication Sig Dispense Refill  . irbesartan-hydrochlorothiazide (AVALIDE) 150-12.5 MG tablet Take 1 tablet by mouth daily.    . metFORMIN (GLUCOPHAGE) 1000 MG tablet Take 1 tablet (1,000 mg total) by mouth 2 (two) times daily with a meal.     No current facility-administered medications for this visit.     PHYSICAL EXAMINATION: ECOG PERFORMANCE STATUS: 0 - Asymptomatic  Vitals:   11/15/18 1106  BP: 132/81  Pulse: 99  Resp: 18  Temp: 98.6 F (37 C)  SpO2: 99%   Filed Weights   11/15/18 1106  Weight: 192 lb 6.4 oz (87.3 kg)    GENERAL:alert, no distress and comfortable SKIN: skin color, texture, turgor are normal, no rashes or significant lesions EYES: normal, Conjunctiva are pink and non-injected, sclera clear OROPHARYNX:no exudate, no erythema and lips, buccal mucosa, and tongue normal  NECK: supple,  thyroid normal size, non-tender, without nodularity LYMPH:  no palpable lymphadenopathy in the cervical, axillary or inguinal LUNGS: clear to auscultation and percussion with normal breathing effort HEART: regular rate & rhythm and no murmurs and no lower extremity  edema ABDOMEN:abdomen soft, non-tender and normal bowel sounds MUSCULOSKELETAL:no cyanosis of digits and no clubbing  NEURO: alert & oriented x 3 with fluent speech, no focal motor/sensory deficits EXTREMITIES: No lower extremity edema BREAST: Left mastectomy.  Right breast no palpable lumps or nodules.  There were some boils underneath the breast.  (exam performed in the presence of a chaperone)  LABORATORY DATA:  I have reviewed the data as listed CMP Latest Ref Rng & Units 03/28/2015 09/13/2014 02/12/2014  Glucose 70 - 140 mg/dl 133 134 133  BUN 7.0 - 26.0 mg/dL 6.1(L) 8.4 10.9  Creatinine 0.6 - 1.1 mg/dL 0.7 0.8 0.7  Sodium 136 - 145 mEq/L 142 142 140  Potassium 3.5 - 5.1 mEq/L 3.8 4.1 3.9  Chloride 96 - 112 mEq/L - - -  CO2 22 - 29 mEq/L '24 27 24  '$ Calcium 8.4 - 10.4 mg/dL 8.9 9.6 9.2  Total Protein 6.4 - 8.3 g/dL 7.1 7.3 7.1  Total Bilirubin 0.20 - 1.20 mg/dL 0.32 0.31 0.24  Alkaline Phos 40 - 150 U/L 140 155(H) 149  AST 5 - 34 U/L '20 17 20  '$ ALT 0 - 55 U/L '16 20 17    '$ Lab Results  Component Value Date   WBC 7.8 03/28/2015   HGB 13.6 03/28/2015   HCT 41.5 03/28/2015   MCV 88.5 03/28/2015   PLT 265 03/28/2015   NEUTROABS 4.4 03/28/2015    ASSESSMENT & PLAN:  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 Tumor wasER negative PR negative HER-2/neu negative.She had her mastectomy on 11/03/2012 with axillary lymph node dissection performed on 12/08/2012. s/p 4 cycles of FEC that was given dose dense. She received this from 01/10/2013 through March 2014. s/p Taxol carboplatin adjuvantly on a weekly basis starting on 03/17/2013 for a total of 12 weeks. S/P adjuvant post mastectomy radiation therapy completed 06/20/13 - 07/31/13.   2 History of DVT in 02/2013 and was on Lovenox then Coumadin. This  was stopped in 11/2013.  3 history of Iron deficiency: Resolved.  We have not been checking blood work. 4. Neuropathy: Improved, off gabapentin  Portwas not removed because apparently would cause $1100 of the surgery center to get the port removed.  Breast cancer Surveillance: 1. Breast exam  12/3/2019is normal 2. Mammogram  11/15/2018 is normal, breast density category C  Return to clinic in 1 year for follow-up    No orders of the defined types were placed in this encounter.  The patient has a good understanding of the overall plan. she agrees with it. she will call with any problems that may develop before the next visit here.  Nicholas Lose, MD 11/15/2018   I, Cloyde Reams Dorshimer, am acting as scribe for Nicholas Lose, MD.  I have reviewed the above documentation for accuracy and completeness, and I agree with the above.

## 2018-11-15 ENCOUNTER — Ambulatory Visit
Admission: RE | Admit: 2018-11-15 | Discharge: 2018-11-15 | Disposition: A | Discharge status: Home / Self Care | Payer: BLUE CROSS/BLUE SHIELD | Source: Ambulatory Visit | Attending: Hematology and Oncology | Admitting: Hematology and Oncology

## 2018-11-15 ENCOUNTER — Inpatient Hospital Stay: Payer: BLUE CROSS/BLUE SHIELD | Attending: Hematology and Oncology | Admitting: Hematology and Oncology

## 2018-11-15 DIAGNOSIS — Z171 Estrogen receptor negative status [ER-]: Secondary | ICD-10-CM | POA: Insufficient documentation

## 2018-11-15 DIAGNOSIS — Z9221 Personal history of antineoplastic chemotherapy: Secondary | ICD-10-CM | POA: Diagnosis not present

## 2018-11-15 DIAGNOSIS — Z9012 Acquired absence of left breast and nipple: Secondary | ICD-10-CM | POA: Insufficient documentation

## 2018-11-15 DIAGNOSIS — Z1231 Encounter for screening mammogram for malignant neoplasm of breast: Secondary | ICD-10-CM

## 2018-11-15 DIAGNOSIS — Z86718 Personal history of other venous thrombosis and embolism: Secondary | ICD-10-CM | POA: Insufficient documentation

## 2018-11-15 DIAGNOSIS — C50412 Malignant neoplasm of upper-outer quadrant of left female breast: Secondary | ICD-10-CM | POA: Diagnosis not present

## 2018-11-15 DIAGNOSIS — Z923 Personal history of irradiation: Secondary | ICD-10-CM | POA: Diagnosis not present

## 2018-11-15 HISTORY — DX: Personal history of antineoplastic chemotherapy: Z92.21

## 2018-11-15 HISTORY — DX: Personal history of irradiation: Z92.3

## 2018-11-15 MED ORDER — METFORMIN HCL 1000 MG PO TABS: 1000.0000 mg | ORAL_TABLET | Freq: Two times a day (BID) | ORAL | Status: DC

## 2018-11-15 MED ORDER — IRBESARTAN-HYDROCHLOROTHIAZIDE 150-12.5 MG PO TABS: 1.0000 | ORAL_TABLET | Freq: Every day | ORAL | Status: DC

## 2018-11-15 NOTE — Assessment & Plan Note (Addendum)
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 Tumor wasER negative PR negative HER-2/neu negative.She had her mastectomy on 11/03/2012 with axillary lymph node dissection performed on 12/08/2012. s/p 4 cycles of FEC that was given dose dense. She received this from 01/10/2013 through March 2014. s/p Taxol carboplatin adjuvantly on a weekly basis starting on 03/17/2013 for a total of 12 weeks. S/P adjuvant post mastectomy radiation therapy completed 06/20/13 - 07/31/13.   2 History of DVT in 02/2013 and was on Lovenox then Coumadin. This was stopped in 11/2013.  3 history of Iron deficiency: Resolved.  We have not been checking blood work. 4. Neuropathy: Improved, off gabapentin  Portwas not removed because apparently would cause $1100 of the surgery center to get the port removed.  Breast cancer Surveillance: 1. Breast exam  12/3/2019is normal 2. Mammogram  11/15/2018 is normal, breast density category C  Return to clinic in 1 year for follow-up

## 2018-11-16 ENCOUNTER — Telehealth: Payer: Self-pay | Admitting: Hematology and Oncology

## 2018-11-16 NOTE — Telephone Encounter (Signed)
Per 12/3 los, made Dec 2020 appt.  Mailed calendar.

## 2018-11-16 NOTE — Telephone Encounter (Signed)
Made Dec 2020 appt per 11/15/18 los.  Mailed calendar.

## 2019-10-10 DIAGNOSIS — L732 Hidradenitis suppurativa: Secondary | ICD-10-CM | POA: Diagnosis not present

## 2019-10-20 ENCOUNTER — Other Ambulatory Visit: Payer: Self-pay | Admitting: Hematology and Oncology

## 2019-10-20 DIAGNOSIS — Z1231 Encounter for screening mammogram for malignant neoplasm of breast: Secondary | ICD-10-CM

## 2019-11-17 ENCOUNTER — Ambulatory Visit: Payer: BLUE CROSS/BLUE SHIELD | Admitting: Hematology and Oncology

## 2019-11-21 ENCOUNTER — Other Ambulatory Visit: Payer: Self-pay

## 2019-11-21 ENCOUNTER — Ambulatory Visit
Admission: RE | Admit: 2019-11-21 | Discharge: 2019-11-21 | Disposition: A | Discharge status: Home / Self Care | Payer: BLUE CROSS/BLUE SHIELD | Source: Ambulatory Visit | Attending: Hematology and Oncology | Admitting: Hematology and Oncology

## 2019-11-21 DIAGNOSIS — Z1231 Encounter for screening mammogram for malignant neoplasm of breast: Secondary | ICD-10-CM

## 2019-11-27 NOTE — Progress Notes (Signed)
Patient Care Team: System, Provider Not In as PCP - General [name], MD (Inactive) as Consulting Physician (Hematology and Oncology) Thea Silversmith, MD as Consulting Physician (Radiation Oncology) Linda Hedges, DO as Consulting Physician (Obstetrics and Gynecology)  DIAGNOSIS:    ICD-10-CM   1. 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 The ER was 2% PR 0%.    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 with above-mentioned history of left breast cancer treated with a left mastectomy, adjuvant chemotherapy, radiation, and who is currently on surveillance. Mammogram was 11/21/19 and showed no evidence of malignancy in the right breast. She presents to the clinic today for annual follow-up.  REVIEW OF SYSTEMS:   Constitutional: Denies fevers, chills or abnormal weight loss Eyes: Denies blurriness of vision Ears, nose, mouth, throat, and face: Denies mucositis or sore throat Respiratory: Denies cough, dyspnea or wheezes Cardiovascular: Denies palpitation, chest discomfort Gastrointestinal: Denies nausea, heartburn or change in bowel habits Skin: Denies abnormal skin rashes Lymphatics: Denies new lymphadenopathy or easy bruising Neurological: Denies numbness, tingling or new weaknesses Behavioral/Psych: Mood is stable, no new changes  Extremities: No lower extremity edema Breast: denies any pain  or lumps or nodules in either breasts All other systems were reviewed with the patient and are negative.  I have reviewed the past medical history, past surgical history, social history and family history with the patient and they are unchanged from previous note.  ALLERGIES:  has No Known Allergies.  MEDICATIONS:  Current Outpatient Medications  Medication Sig Dispense Refill  . irbesartan-hydrochlorothiazide (AVALIDE) 150-12.5 MG tablet Take 1 tablet by mouth daily.    . metFORMIN (GLUCOPHAGE) 1000 MG tablet Take 1 tablet (1,000 mg total) by mouth 2 (two) times daily with a meal.     No current facility-administered medications for this visit.    PHYSICAL EXAMINATION: ECOG PERFORMANCE STATUS: 0 - Asymptomatic  Vitals:   11/28/19 1041  BP: (!) 146/86  Pulse: 86  Resp: 18  Temp: 98.5 F (36.9 C)  SpO2: 100%   Filed Weights   11/28/19 1041  Weight: 195 lb 3.2 oz (88.5 kg)    GENERAL: alert, no distress and comfortable SKIN: skin color, texture, turgor are normal, no rashes or significant lesions EYES: normal, Conjunctiva are pink and non-injected, sclera clear OROPHARYNX: no exudate, no erythema and lips, buccal mucosa, and tongue normal  NECK: supple, thyroid normal size, non-tender, without nodularity LYMPH: no palpable lymphadenopathy in the cervical, axillary or inguinal LUNGS: clear to auscultation and percussion with normal breathing effort HEART: regular rate & rhythm and no murmurs and no lower extremity edema ABDOMEN: abdomen soft, non-tender and normal bowel sounds MUSCULOSKELETAL: no cyanosis of digits and no clubbing  NEURO: alert & oriented x 3 with fluent speech, no focal motor/sensory deficits EXTREMITIES: No lower extremity edema BREAST: No palpable masses or nodules in either right or left  breasts. No palpable axillary supraclavicular or infraclavicular adenopathy no breast tenderness or nipple discharge. (exam performed in the presence of a chaperone)   LABORATORY DATA:  I have reviewed the data as listed CMP Latest Ref Rng & Units 03/28/2015 09/13/2014 02/12/2014  Glucose 70 - 140 mg/dl 133 134 133  BUN 7.0 - 26.0 mg/dL 6.1(L) 8.4 10.9  Creatinine 0.6 - 1.1 mg/dL 0.7 0.8 0.7  Sodium 136 - 145 mEq/L 142 142 140  Potassium 3.5 - 5.1 mEq/L 3.8 4.1 3.9  Chloride 96 - 112 mEq/L - - -  CO2 22 - 29 mEq/L '24 27 24  '$ Calcium 8.4 - 10.4 mg/dL 8.9 9.6 9.2  Total Protein 6.4 - 8.3 g/dL 7.1 7.3 7.1  Total Bilirubin 0.20 - 1.20 mg/dL 0.32 0.31 0.24  Alkaline Phos 40 - 150 U/L 140 155(H) 149  AST 5 - 34 U/L '20 17 20  '$ ALT 0 - 55 U/L '16 20 17    '$ Lab Results  Component Value Date   WBC 7.8 03/28/2015   HGB 13.6 03/28/2015   HCT 41.5 03/28/2015   MCV 88.5 03/28/2015   PLT 265 03/28/2015   NEUTROABS 4.4 03/28/2015    ASSESSMENT & PLAN:  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 Tumor wasER negative PR negative HER-2/neu negative.She had her mastectomy on 11/03/2012 with axillary lymph node dissection performed on 12/08/2012. s/p 4 cycles of FEC that was given dose dense. She received this from 01/10/2013 through March 2014. s/p Taxol carboplatin adjuvantly on a weekly basis starting on 03/17/2013 for a total of 12 weeks. S/P adjuvant post mastectomy radiation therapy completed 06/20/13 - 07/31/13.   2 History of DVT in 02/2013 and was on Lovenox then Coumadin. This was stopped in 11/2013.  3 history of Iron deficiency: Resolved.  We have not been checking blood work. 4. Neuropathy: Improved, off gabapentin  Portwas not removed because apparently would cause $1100 of the surgery center to get the port removed.  Breast cancer Surveillance: 1. Breast exam 12/15/2020is normal 2. Mammogram12/8/2020is benign, breast density category C  Return to clinic  in 1 year for follow-up    No orders of the defined types were placed in this encounter.  The patient has a good understanding of the overall plan. she agrees with it. she will call with any problems that may develop before the next visit here.  Nicholas Lose, MD 11/28/2019  Julious Oka Dorshimer, am acting as scribe for Dr. Nicholas Lose.  I have reviewed the above document for accuracy and completeness, and I agree with the above.

## 2019-11-28 ENCOUNTER — Telehealth: Payer: Self-pay | Admitting: Hematology and Oncology

## 2019-11-28 ENCOUNTER — Inpatient Hospital Stay: Payer: BC Managed Care – PPO | Attending: Hematology and Oncology | Admitting: Hematology and Oncology

## 2019-11-28 ENCOUNTER — Other Ambulatory Visit: Payer: Self-pay

## 2019-11-28 DIAGNOSIS — Z923 Personal history of irradiation: Secondary | ICD-10-CM | POA: Insufficient documentation

## 2019-11-28 DIAGNOSIS — G629 Polyneuropathy, unspecified: Secondary | ICD-10-CM | POA: Diagnosis not present

## 2019-11-28 DIAGNOSIS — Z853 Personal history of malignant neoplasm of breast: Secondary | ICD-10-CM | POA: Insufficient documentation

## 2019-11-28 DIAGNOSIS — Z9012 Acquired absence of left breast and nipple: Secondary | ICD-10-CM | POA: Diagnosis not present

## 2019-11-28 DIAGNOSIS — Z86718 Personal history of other venous thrombosis and embolism: Secondary | ICD-10-CM | POA: Diagnosis not present

## 2019-11-28 DIAGNOSIS — Z9221 Personal history of antineoplastic chemotherapy: Secondary | ICD-10-CM | POA: Insufficient documentation

## 2019-11-28 DIAGNOSIS — C50412 Malignant neoplasm of upper-outer quadrant of left female breast: Secondary | ICD-10-CM | POA: Diagnosis not present

## 2019-11-28 MED ORDER — SULFAMETHOXAZOLE-TRIMETHOPRIM 800-160 MG PO TABS: 1.0000 | ORAL_TABLET | Freq: Every day | ORAL | Status: DC

## 2019-11-28 NOTE — Assessment & Plan Note (Signed)
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 Tumor wasER negative PR negative HER-2/neu negative.She had her mastectomy on 11/03/2012 with axillary lymph node dissection performed on 12/08/2012. s/p 4 cycles of FEC that was given dose dense. She received this from 01/10/2013 through March 2014. s/p Taxol carboplatin adjuvantly on a weekly basis starting on 03/17/2013 for a total of 12 weeks. S/P adjuvant post mastectomy radiation therapy completed 06/20/13 - 07/31/13.   2 History of DVT in 02/2013 and was on Lovenox then Coumadin. This was stopped in 11/2013.  3 history of Iron deficiency: Resolved.  We have not been checking blood work. 4. Neuropathy: Improved, off gabapentin  Portwas not removed because apparently would cause $1100 of the surgery center to get the port removed.  Breast cancer Surveillance: 1. Breast exam 12/15/2020is normal 2. Mammogram12/8/2020is benign, breast density category C  Return to clinic in 1 year for follow-up

## 2019-11-28 NOTE — Telephone Encounter (Signed)
I talk with patient regarding schedule  

## 2019-12-04 DIAGNOSIS — Z20828 Contact with and (suspected) exposure to other viral communicable diseases: Secondary | ICD-10-CM | POA: Diagnosis not present

## 2020-07-17 ENCOUNTER — Encounter: Payer: Self-pay | Admitting: Gastroenterology

## 2020-09-12 ENCOUNTER — Ambulatory Visit (AMBULATORY_SURGERY_CENTER): Payer: Self-pay

## 2020-09-12 ENCOUNTER — Other Ambulatory Visit: Payer: Self-pay

## 2020-09-12 VITALS — Ht 64.0 in | Wt 198.0 lb

## 2020-09-12 DIAGNOSIS — Z1211 Encounter for screening for malignant neoplasm of colon: Secondary | ICD-10-CM

## 2020-09-12 MED ORDER — SUTAB 1479-225-188 MG PO TABS: 1.0000 | ORAL_TABLET | ORAL | 0 refills | Status: DC

## 2020-09-12 NOTE — Progress Notes (Signed)
No egg or soy allergy known to patient  No issues with past sedation with any surgeries or procedures No intubation problems in the past  No FH of Malignant Hyperthermia No diet pills per patient No home 02 use per patient  No blood thinners per patient  Pt denies issues with constipation  No A fib or A flutter  COVID 19 guidelines implemented in [facility] today with Pt and RN  Coupon given to pt in [facility] today , Code to Pharmacy  COVID vaccines completed on 01/2020 per pt;  Due to the COVID-19 pandemic we are asking patients to follow these guidelines. Please only bring one care partner. Please be aware that your care partner may wait in the car in the parking lot or if they feel like they will be too hot to wait in the car, they may wait in the lobby on the 4th floor. All care partners are required to wear a mask the entire time (we do not have any that we can provide them), they need to practice social distancing, and we will do a Covid check for all patient's and care partners when you arrive. Also we will check their temperature and your temperature. If the care partner waits in their car they need to stay in the parking lot the entire time and we will call them on their cell phone when the patient is ready for discharge so they can bring the car to the front of the building. Also all patient's will need to wear a mask into building. 

## 2020-09-13 ENCOUNTER — Encounter: Payer: Self-pay | Admitting: Gastroenterology

## 2020-09-26 ENCOUNTER — Ambulatory Visit (AMBULATORY_SURGERY_CENTER): Payer: BC Managed Care – PPO | Admitting: Gastroenterology

## 2020-09-26 ENCOUNTER — Encounter: Payer: Self-pay | Admitting: Gastroenterology

## 2020-09-26 ENCOUNTER — Other Ambulatory Visit: Payer: Self-pay

## 2020-09-26 VITALS — BP 180/104 | HR 89 | Temp 96.6°F | Resp 22 | Ht 64.0 in | Wt 198.0 lb

## 2020-09-26 DIAGNOSIS — Z1211 Encounter for screening for malignant neoplasm of colon: Secondary | ICD-10-CM | POA: Diagnosis not present

## 2020-09-26 MED ORDER — SODIUM CHLORIDE 0.9 % IV SOLN: 500.0000 mL | Freq: Once | INTRAVENOUS | Status: DC

## 2020-09-26 NOTE — Op Note (Signed)
[facility] Patient Name: Hannah Hale Procedure Date: 09/26/2020 8:39 AM MRN: [id number] Endoscopist: Remo Lipps P. Havery Moros , MD Age: 54 Referring MD:  Date of Birth: [birth year] Gender: Female Account #: [id number] Procedure:                Colonoscopy Indications:              Screening for colorectal malignant neoplasm, This                            is the patient's first colonoscopy Medicines:                Monitored Anesthesia Care Procedure:                Pre-Anesthesia Assessment:                           - Prior to the procedure, a History and Physical                            was performed, and patient medications and                            allergies were reviewed. The patient's tolerance of                            previous anesthesia was also reviewed. The risks                            and benefits of the procedure and the sedation                            options and risks were discussed with the patient.                            All questions were answered, and informed consent                            was obtained. Prior Anticoagulants: The patient has                            taken no previous anticoagulant or antiplatelet                            agents. ASA Grade Assessment: II - A patient with                            mild systemic disease. After reviewing the risks                            and benefits, the patient was deemed in                            satisfactory condition to undergo the procedure.  After obtaining informed consent, the colonoscope                            was passed under direct vision. Throughout the                            procedure, the patient's blood pressure, pulse, and                            oxygen saturations were monitored continuously. The                            Colonoscope was introduced through the anus and                            advanced to the  the cecum, identified by                            appendiceal orifice and ileocecal valve. The                            colonoscopy was performed without difficulty. The                            patient tolerated the procedure well. The quality                            of the bowel preparation was adequate. The                            ileocecal valve, appendiceal orifice, and rectum                            were photographed. Scope In: 8:44:41 AM Scope Out: 9:02:41 AM Scope Withdrawal Time: 0 hours 16 minutes 50 seconds  Total Procedure Duration: 0 hours 18 minutes 0 seconds  Findings:                 Hemorrhoids were found on perianal exam.                           Internal hemorrhoids were found during                            retroflexion. The hemorrhoids were large.                           The exam was otherwise without abnormality. Complications:            No immediate complications. Estimated blood loss:                            None. Estimated Blood Loss:     Estimated blood loss: none. Impression:               - Hemorrhoids found on perianal exam.                           -  Internal hemorrhoids.                           - The examination was otherwise normal.                           - No polyps Recommendation:           - Patient has a contact number available for                            emergencies. The signs and symptoms of potential                            delayed complications were discussed with the                            patient. Return to normal activities tomorrow.                            Written discharge instructions were provided to the                            patient.                           - Resume previous diet.                           - Continue present medications.                           - Repeat colonoscopy in 10 years for screening                            purposes.                           - Consideration  for hemorrhoid banding if symptoms                            bothersome to patient Carlota Raspberry. Dreshawn Hendershott, MD 09/26/2020 9:05:47 AM This report has been signed electronically.

## 2020-09-26 NOTE — Progress Notes (Signed)
Report to PACU, RN, vss, BBS= Clear.  

## 2020-09-26 NOTE — Progress Notes (Signed)
Pt's states no medical or surgical changes since previsit or office visit. 

## 2020-09-26 NOTE — Patient Instructions (Signed)
Please read handouts provided. Continue present medications. Consider hemorrhoid banding if symptoms persist.      YOU HAD AN ENDOSCOPIC PROCEDURE TODAY AT [place]:   Refer to the procedure report that was given to you for any specific questions about what was found during the examination.  If the procedure report does not answer your questions, please call your gastroenterologist to clarify.  If you requested that your care partner not be given the details of your procedure findings, then the procedure report has been included in a sealed envelope for you to review at your convenience later.  YOU SHOULD EXPECT: Some feelings of bloating in the abdomen. Passage of more gas than usual.  Walking can help get rid of the air that was put into your GI tract during the procedure and reduce the bloating. If you had a lower endoscopy (such as a colonoscopy or flexible sigmoidoscopy) you may notice spotting of blood in your stool or on the toilet paper. If you underwent a bowel prep for your procedure, you may not have a normal bowel movement for a few days.  Please Note:  You might notice some irritation and congestion in your nose or some drainage.  This is from the oxygen used during your procedure.  There is no need for concern and it should clear up in a day or so.  SYMPTOMS TO REPORT IMMEDIATELY:   Following lower endoscopy (colonoscopy or flexible sigmoidoscopy):  Excessive amounts of blood in the stool  Significant tenderness or worsening of abdominal pains  Swelling of the abdomen that is new, acute  Fever of 100F or higher    For urgent or emergent issues, a gastroenterologist can be reached at any hour by calling [phone]. Do not use MyChart messaging for urgent concerns.    DIET:  We do recommend a small meal at first, but then you may proceed to your regular diet.  Drink plenty of fluids but you should avoid alcoholic beverages for 24 hours.  ACTIVITY:   You should plan to take it easy for the rest of today and you should NOT DRIVE or use heavy machinery until tomorrow (because of the sedation medicines used during the test).    FOLLOW UP: Our staff will call the number listed on your records 48-72 hours following your procedure to check on you and address any questions or concerns that you may have regarding the information given to you following your procedure. If we do not reach you, we will leave a message.  We will attempt to reach you two times.  During this call, we will ask if you have developed any symptoms of COVID 19. If you develop any symptoms (ie: fever, flu-like symptoms, shortness of breath, cough etc.) before then, please call [phone].  If you test positive for Covid 19 in the 2 weeks post procedure, please call and report this information to [place].    If any biopsies were taken you will be contacted by phone or by letter within the next 1-3 weeks.  Please call us at [phone] if you have not heard about the biopsies in 3 weeks.    SIGNATURES/CONFIDENTIALITY: You and/or your care partner have signed paperwork which will be entered into your electronic medical record.  These signatures attest to the fact that that the information above on your After Visit Summary has been reviewed and is understood.  Full responsibility of the confidentiality of this discharge information lies with you and/or your  care-partner.

## 2020-09-30 ENCOUNTER — Telehealth: Payer: Self-pay

## 2020-09-30 ENCOUNTER — Telehealth: Payer: Self-pay | Admitting: *Deleted

## 2020-09-30 NOTE — Telephone Encounter (Signed)
NO ANSWER, MESSAGE LEFT FOR PATIENT. 

## 2020-09-30 NOTE — Telephone Encounter (Signed)
Called and Left message for pt to call and schedule hemorrhoidal banding appts with Dr. Havery Moros.

## 2020-09-30 NOTE — Telephone Encounter (Signed)
-----   Message from Roetta Sessions, [facility] sent at 09/26/2020  1:17 PM EDT ----- Regarding: FW: banding  ----- Message ----- From: Yetta Flock, MD Sent: 09/26/2020  11:35 AM EDT To: Roetta Sessions, CMA Subject: banding                                        Hi Jan, Can you give this patient a call in the next few days to schedule a hemorrhoid banding, first available? Thanks

## 2020-09-30 NOTE — Telephone Encounter (Signed)
No answer for post procedure call back. Left message for patient to call with questions or concerns. 

## 2020-10-01 NOTE — Telephone Encounter (Signed)
Pt is requesting a call back from a nurse, pt would like the 12/7 appt for hemorrhoid banding.

## 2020-10-01 NOTE — Telephone Encounter (Signed)
Scheduled pt for her 1st banding appt on Tuesday, 12-7 at 3:40pm.  Called and left appt information for pt on her voice mail.

## 2020-11-19 ENCOUNTER — Ambulatory Visit: Payer: BC Managed Care – PPO | Admitting: Gastroenterology

## 2020-11-19 VITALS — BP 136/72 | HR 84 | Ht 64.0 in | Wt 199.0 lb

## 2020-11-19 DIAGNOSIS — K642 Third degree hemorrhoids: Secondary | ICD-10-CM | POA: Diagnosis not present

## 2020-11-19 NOTE — Patient Instructions (Addendum)
  If you are age 53 or older, your body mass index should be between 23-30. Your Body mass index is 34.16 kg/m. If this is out of the aforementioned range listed, please consider follow up with your Primary Care Provider.  If you are age 70 or younger, your body mass index should be between 19-25. Your Body mass index is 34.16 kg/m. If this is out of the aformentioned range listed, please consider follow up with your Primary Care Provider.   HEMORRHOID BANDING PROCEDURE    FOLLOW-UP CARE   1. The procedure you have had should have been relatively painless since the banding of the area involved does not have nerve endings and there is no pain sensation.  The rubber band cuts off the blood supply to the hemorrhoid and the band may fall off as soon as 48 hours after the banding (the band may occasionally be seen in the toilet bowl following a bowel movement). You may notice a temporary feeling of fullness in the rectum which should respond adequately to plain Tylenol or Motrin.  2. Following the banding, avoid strenuous exercise that evening and resume full activity the next day.  A sitz bath (soaking in a warm tub) or bidet is soothing, and can be useful for cleansing the area after bowel movements.     3. To avoid constipation, take two tablespoons of natural wheat bran, natural oat bran, flax, Benefiber or any over the counter fiber supplement and increase your water intake to 7-8 glasses daily.    4. Unless you have been prescribed anorectal medication, do not put anything inside your rectum for two weeks: No suppositories, enemas, fingers, etc.  5. Occasionally, you may have more bleeding than usual after the banding procedure.  This is often from the untreated hemorrhoids rather than the treated one.  Don't be concerned if there is a tablespoon or so of blood.  If there is more blood than this, lie flat with your bottom higher than your head and apply an ice pack to the area. If the  bleeding does not stop within a half an hour or if you feel faint, call our office at [phone] or go to the emergency room.  6. Problems are not common; however, if there is a substantial amount of bleeding, severe pain, chills, fever or difficulty passing urine (very rare) or other problems, you should call us at [phone] or report to the nearest emergency room.  7. Do not stay seated continuously for more than 2-3 hours for a day or two after the procedure.  Tighten your buttock muscles 10-15 times every two hours and take 10-15 deep breaths every 1-2 hours.  Do not spend more than a few minutes on the toilet if you cannot empty your bowel; instead re-visit the toilet at a later time.  You have been scheduled for the second banding on 12/12/2020 at 4:00 PM.  You have been scheduled for the third banding on 12/31/2020 at 3:40 PM  Please take a daily fiber supplement. We are giving you a handout of the different types you can take and we are giving you some samples of Fiber Choice to try.  It was great seeing you today!  Thank you for entrusting me with your care and choosing [facility].  Dr. Havery Moros

## 2020-11-19 NOTE — Progress Notes (Signed)
54 year old female who had a recent colonoscopy in October noted to have large hemorrhoids.  She complains of grade 3 hemorrhoids, chronic symptoms of prolapse which bother her.  She denies pain or bleeding from this but wants to proceed with more definitive therapy to treat this.  I discussed risk and benefits of hemorrhoid banding to include bleeding and pain.  Following discussion of risk and benefit she wants to proceed.  Colonoscopy 09/26/20 - Hemorrhoids were found on perianal exam. - Internal hemorrhoids were found during retroflexion. The hemorrhoids were large. - The exam was otherwise without abnormality   PROCEDURE NOTE: The patient presents with symptomatic grade III  hemorrhoids, requesting rubber band ligation of his/her hemorrhoidal disease.  All risks, benefits and alternative forms of therapy were described and informed consent was obtained.   The anorectum was pre-medicated with 0.125% nitroglycerin The decision was made to band the LL internal hemorrhoid, and the [place] was used to perform band ligation without complication.  Digital anorectal examination was then performed to assure proper positioning of the band, and to adjust the banded tissue as required.  The patient was discharged home without pain or other issues.  Dietary and behavioral recommendations were given and along with follow-up instructions.     The following adjunctive treatments were recommended: Daily fiber supplement  The patient will return in 2-4 weeks for  follow-up and possible additional banding as required. No complications were encountered and the patient tolerated the procedure well.  Elon Cellar, MD [facility]

## 2020-11-25 ENCOUNTER — Other Ambulatory Visit: Payer: Self-pay | Admitting: Hematology and Oncology

## 2020-11-25 DIAGNOSIS — Z1231 Encounter for screening mammogram for malignant neoplasm of breast: Secondary | ICD-10-CM

## 2020-11-27 ENCOUNTER — Ambulatory Visit: Payer: BC Managed Care – PPO | Admitting: Hematology and Oncology

## 2020-12-12 ENCOUNTER — Other Ambulatory Visit: Payer: Self-pay

## 2020-12-12 ENCOUNTER — Encounter: Payer: Self-pay | Admitting: Gastroenterology

## 2020-12-12 ENCOUNTER — Ambulatory Visit (INDEPENDENT_AMBULATORY_CARE_PROVIDER_SITE_OTHER): Payer: BC Managed Care – PPO | Admitting: Gastroenterology

## 2020-12-12 VITALS — BP 138/80 | HR 88 | Ht 64.0 in | Wt 201.0 lb

## 2020-12-12 DIAGNOSIS — K642 Third degree hemorrhoids: Secondary | ICD-10-CM

## 2020-12-12 NOTE — Patient Instructions (Signed)
If you are age 53 or older, your body mass index should be between 23-30. Your Body mass index is 34.5 kg/m. If this is out of the aforementioned range listed, please consider follow up with your Primary Care Provider.  If you are age 63 or younger, your body mass index should be between 19-25. Your Body mass index is 34.5 kg/m. If this is out of the aformentioned range listed, please consider follow up with your Primary Care Provider.   HEMORRHOID BANDING PROCEDURE    FOLLOW-UP CARE   1. The procedure you have had should have been relatively painless since the banding of the area involved does not have nerve endings and there is no pain sensation.  The rubber band cuts off the blood supply to the hemorrhoid and the band may fall off as soon as 48 hours after the banding (the band may occasionally be seen in the toilet bowl following a bowel movement). You may notice a temporary feeling of fullness in the rectum which should respond adequately to plain Tylenol or Motrin.  2. Following the banding, avoid strenuous exercise that evening and resume full activity the next day.  A sitz bath (soaking in a warm tub) or bidet is soothing, and can be useful for cleansing the area after bowel movements.     3. To avoid constipation, take two tablespoons of natural wheat bran, natural oat bran, flax, Benefiber or any over the counter fiber supplement and increase your water intake to 7-8 glasses daily.    4. Unless you have been prescribed anorectal medication, do not put anything inside your rectum for two weeks: No suppositories, enemas, fingers, etc.  5. Occasionally, you may have more bleeding than usual after the banding procedure.  This is often from the untreated hemorrhoids rather than the treated one.  Don't be concerned if there is a tablespoon or so of blood.  If there is more blood than this, lie flat with your bottom higher than your head and apply an ice pack to the area. If the bleeding  does not stop within a half an hour or if you feel faint, call our office at [phone] or go to the emergency room.  6. Problems are not common; however, if there is a substantial amount of bleeding, severe pain, chills, fever or difficulty passing urine (very rare) or other problems, you should call us at [phone] or report to the nearest emergency room.  7. Do not stay seated continuously for more than 2-3 hours for a day or two after the procedure.  Tighten your buttock muscles 10-15 times every two hours and take 10-15 deep breaths every 1-2 hours.  Do not spend more than a few minutes on the toilet if you cannot empty your bowel; instead re-visit the toilet at a later time.

## 2020-12-12 NOTE — Progress Notes (Signed)
  54 year old female who had a recent colonoscopy in October noted to have large hemorrhoids.  She complains of grade 3 hemorrhoids, chronic symptoms of prolapse which bother her.  She had LL hemorrhoid banded 11/19/20 and tolerated it well. She has had some improvement in her symptoms and wishes to have further banding performed. Following discussion of risk and benefit she wants to proceed.  Colonoscopy 09/26/20 - Hemorrhoids were found on perianal exam. - Internal hemorrhoids were found during retroflexion. The hemorrhoids were large. - The exam was otherwise without abnormality   PROCEDURE NOTE: The patient presents with symptomatic grade III  hemorrhoids, requesting rubber band ligation of his/her hemorrhoidal disease. All risks, benefits and alternative forms of therapy were described and informed consent was obtained.   The anorectum was pre-medicated with 0.125% nitroglycerin The decision was made to band the RP internal hemorrhoid, and the [facility] [place] [facility] was used to perform band ligation without complication. Digital anorectal examination was then performed to assure proper positioning of the band, and to adjust the banded tissue as required.  The patient was discharged home without pain or other issues. Dietary and behavioral recommendations were given and along with follow-up instructions.    The following adjunctive treatments were recommended: Daily fiber supplement  The patient will return in 2-4 weeks for  follow-up and possible additional banding as required. No complications were encountered and the patient tolerated the procedure well.  Ileene Patrick, MD [facility]

## 2020-12-24 ENCOUNTER — Other Ambulatory Visit: Payer: Self-pay

## 2020-12-24 ENCOUNTER — Ambulatory Visit
Admission: RE | Admit: 2020-12-24 | Discharge: 2020-12-24 | Disposition: A | Discharge status: Home / Self Care | Payer: BC Managed Care – PPO | Source: Ambulatory Visit | Attending: Hematology and Oncology | Admitting: Hematology and Oncology

## 2020-12-24 DIAGNOSIS — Z1231 Encounter for screening mammogram for malignant neoplasm of breast: Secondary | ICD-10-CM

## 2020-12-31 ENCOUNTER — Encounter: Payer: Self-pay | Admitting: Gastroenterology

## 2020-12-31 ENCOUNTER — Ambulatory Visit (INDEPENDENT_AMBULATORY_CARE_PROVIDER_SITE_OTHER): Payer: BC Managed Care – PPO | Admitting: Gastroenterology

## 2020-12-31 VITALS — BP 128/86 | HR 86 | Ht 64.0 in | Wt 202.0 lb

## 2020-12-31 DIAGNOSIS — K642 Third degree hemorrhoids: Secondary | ICD-10-CM | POA: Diagnosis not present

## 2020-12-31 NOTE — Progress Notes (Signed)
55 year old female who had a recent colonoscopy in October noted to have large hemorrhoids. She complains of grade 3 hemorrhoids, chronic symptoms of prolapse which bother her. She had LL hemorrhoid banded 11/19/20, had the RP banded on 12/12/20 and tolerated it well. She has had some improvement in her symptoms and wishes to have last banding performed.Following discussion of risk and benefit she wants to proceed.  Colonoscopy 09/26/20 -Hemorrhoids were found on perianal exam. - Internal hemorrhoids were found during retroflexion. The hemorrhoids were large. - The exam was otherwise without abnormality   PROCEDURE NOTE: The patient presents with symptomatic gradeIIIhemorrhoids, requesting rubber band ligation of his/her hemorrhoidal disease.All risks, benefits and alternative forms of therapy were described and informed consent was obtained.   The anorectum was pre-medicated with0.125% nitroglycerin The decision was made to band the RAinternal hemorrhoid, and the [place] was used to perform band ligation without complication. Digital anorectal examination was then performed to assure proper positioning of the band, and to adjust the banded tissue as required. The patient was discharged home without pain or other issues.Dietary and behavioral recommendations were given and along with follow-up instructions.   The patient will return as neededfor follow-up and possible additional banding as required. No complications were encountered and the patient tolerated the procedure well.  El[place]wood Cellar, MD [facility]

## 2020-12-31 NOTE — Patient Instructions (Signed)
If you are age 53 or older, your body mass index should be between 23-30. Your Body mass index is 34.67 kg/m. If this is out of the aforementioned range listed, please consider follow up with your Primary Care Provider.  If you are age 60 or younger, your body mass index should be between 19-25. Your Body mass index is 34.67 kg/m. If this is out of the aformentioned range listed, please consider follow up with your Primary Care Provider.   HEMORRHOID BANDING PROCEDURE    FOLLOW-UP CARE   1. The procedure you have had should have been relatively painless since the banding of the area involved does not have nerve endings and there is no pain sensation.  The rubber band cuts off the blood supply to the hemorrhoid and the band may fall off as soon as 48 hours after the banding (the band may occasionally be seen in the toilet bowl following a bowel movement). You may notice a temporary feeling of fullness in the rectum which should respond adequately to plain Tylenol or Motrin.  2. Following the banding, avoid strenuous exercise that evening and resume full activity the next day.  A sitz bath (soaking in a warm tub) or bidet is soothing, and can be useful for cleansing the area after bowel movements.     3. To avoid constipation, take two tablespoons of natural wheat bran, natural oat bran, flax, Benefiber or any over the counter fiber supplement and increase your water intake to 7-8 glasses daily.    4. Unless you have been prescribed anorectal medication, do not put anything inside your rectum for two weeks: No suppositories, enemas, fingers, etc.  5. Occasionally, you may have more bleeding than usual after the banding procedure.  This is often from the untreated hemorrhoids rather than the treated one.  Don't be concerned if there is a tablespoon or so of blood.  If there is more blood than this, lie flat with your bottom higher than your head and apply an ice pack to the area. If the bleeding  does not stop within a half an hour or if you feel faint, call our office at [phone] or go to the emergency room.  6. Problems are not common; however, if there is a substantial amount of bleeding, severe pain, chills, fever or difficulty passing urine (very rare) or other problems, you should call us at [phone] or report to the nearest emergency room.  7. Do not stay seated continuously for more than 2-3 hours for a day or two after the procedure.  Tighten your buttock muscles 10-15 times every two hours and take 10-15 deep breaths every 1-2 hours.  Do not spend more than a few minutes on the toilet if you cannot empty your bowel; instead re-visit the toilet at a later time.    Thank you for entrusting me with your care and for choosing [facility], Dr. Venersborg Cellar

## 2022-02-25 ENCOUNTER — Other Ambulatory Visit: Payer: Self-pay | Admitting: Hematology and Oncology

## 2022-02-25 DIAGNOSIS — Z1231 Encounter for screening mammogram for malignant neoplasm of breast: Secondary | ICD-10-CM

## 2022-03-17 ENCOUNTER — Ambulatory Visit
Admission: RE | Admit: 2022-03-17 | Discharge: 2022-03-17 | Disposition: A | Discharge status: Home / Self Care | Payer: BC Managed Care – PPO | Source: Ambulatory Visit | Attending: Hematology and Oncology | Admitting: Hematology and Oncology

## 2022-03-17 DIAGNOSIS — Z1231 Encounter for screening mammogram for malignant neoplasm of breast: Secondary | ICD-10-CM

## 2022-04-24 ENCOUNTER — Encounter: Payer: Self-pay | Admitting: Registered"

## 2022-04-24 ENCOUNTER — Encounter: Payer: BC Managed Care – PPO | Attending: *Deleted | Admitting: Registered"

## 2022-04-24 DIAGNOSIS — E1165 Type 2 diabetes mellitus with hyperglycemia: Secondary | ICD-10-CM | POA: Insufficient documentation

## 2022-04-24 NOTE — Progress Notes (Signed)
Diabetes Self-Management Education ? ?Visit Type: First/Initial ? ?Appt. Start Time: 1015 Appt. End Time: 1100 ? ?04/24/2022 ? ?[name], identified by name and date of birth, is a 56 y.o. female with a diagnosis of Diabetes: Type 2.  ? ?ASSESSMENT ? ?Last menstrual period 11/28/2012. ?There is no height or weight on file to calculate BMI. ? ?A1c was 14% in January, has appt in June for updated labs. ?Medication: metformin 500 mg bid ? ?Pt states her goal for this appointment is to know what she should eat and what to stay away from.  ? ?RD provided basics of eating whole grains, lean protein, vegetables and counting the carbohydrates. RD reiterated several times that guidelines for carbs given today were general and some people need diabetes medication to help their bodies use the energy from their diet. The best way to tell if diet is working to keep blood sugar controlled is to check blood sugar.  ? ?Pt reports she does not check her blood sugar and her doctor said she could wait until next MD visit to decide whether or not she needs to check blood sugar. ? ?Pt has developed several healthy habits such as staying active and drinking plenty of water and does not drink sweetened beverages. ? ?Pt reports she used to weigh 230 lbs 7 yrs ago and since January has lost another 15 lbs. Pt states she hasn't changed diet, mostly just reduced amount of food, usually eats 1 meal per day and may have a snack at work, doesn't eat sweets, and drinks a lot of water, cannot drink a whole 8 oz can of soda, too sweet. ? ?Pt reports she doesn't cook much at home because she and her husband don't eat much food and felt like she was wasting food. Pt states she likes fried chicken. ? ?Sleep: 6 hrs. Pt states she is a night owl, has a hard time winding down after work and can't sleep in because dog wakes her up to go out. ?Stress: 5/10; gets hair done ?Supplements: none ? ? Diabetes Self-Management Education - 04/24/22 1016    ? ?  ? Visit Information  ? Visit Type First/Initial   ?  ? Initial Visit  ? Diabetes Type Type 2   ? Date Diagnosed 2 months ago per patient   ? Are you currently following a meal plan? No   ? Are you taking your medications as prescribed? Yes   ?  ? Health Coping  ? How would you rate your overall health? Good   ?  ? Psychosocial Assessment  ? Patient Belief/Attitude about Diabetes Motivated to manage diabetes   ? How often do you need to have someone help you when you read instructions, pamphlets, or other written materials from your doctor or pharmacy? 1 - Never   ? What is the last grade level you completed in school? 1 yr college   ?  ? Complications  ? Last HgB A1C per patient/outside source 14 %   ? How often do you check your blood sugar? 0 times/day (not testing)   ? Number of hypoglycemic episodes per month --   no hypoglycemic symptoms  ? Have you had a dilated eye exam in the past 12 months? Yes   ? Have you had a dental exam in the past 12 months? No   ? Are you checking your feet? No   ?  ? Dietary Intake  ? Dinner (5:30) cob salad from [place], [place]   ?  Snack (evening) pb crackers maybe   ? Beverage(s) water, 2 small cans of beer   ?  ? Activity / Exercise  ? Activity / Exercise Type ADL's   active walking at work.  ? How many days per week do you exercise? 1   ? How many minutes per day do you exercise? 20   ? Total minutes per week of exercise 20   ?  ? Patient Education  ? Previous Diabetes Education No   ? Disease Pathophysiology Definition of diabetes, type 1 and 2, and the diagnosis of diabetes   ? Healthy Eating Food label reading, portion sizes and measuring food.;Plate Method;Carbohydrate counting   ?  ? Individualized Goals (developed by patient)  ? Nutrition General guidelines for healthy choices and portions discussed;Carb counting;Other (comment)   pay attn to sat fat intake  ? Problem Solving Sleep Pattern   ?  ? Outcomes  ? Expected Outcomes Demonstrated interest in learning. Expect  positive outcomes   ? Future DMSE PRN   ? Program Status Completed   ? ?  ?  ? ?  ? ? ?Individualized Plan for Diabetes Self-Management Training:  ? ?Learning Objective:  Patient will have a greater understanding of diabetes self-management. ?Patient education plan is to attend individual and/or group sessions per assessed needs and concerns. ? ?Patient Instructions  ?Goals: ?Use MyPlate as a guide for balanced eating; aim for 3 servings of vegetables daily ?Consider starting with keeping your carb count to ~30 grams per meal and ~15 grams per snack ?Read the label of your go-to Parfait ?Check out the handout that shows carbs in meals at restaurants (Nutrition in the [place]) ?Continue drinking plenty of water ?Read through the sleep handout for ideas to get more sleep ? ?Expected Outcomes:  Demonstrated interest in learning. Expect positive outcomes ? ?Education material provided: ADA - How to Thrive: A Guide for Your Journey with Diabetes, Sleep hygiene, Nutrition in the [employer] ? ?If problems or questions, patient to contact team via:  Phone ? ?Future DSME appointment: PRN ?

## 2022-04-24 NOTE — Patient Instructions (Addendum)
Goals: ?Use MyPlate as a guide for balanced eating; aim for 3 servings of vegetables daily ?Consider starting with keeping your carb count to ~30 grams per meal and ~15 grams per snack ?Read the label of your go-to Parfait ?Check out the handout that shows carbs in meals at restaurants (Nutrition in the [place]) ?Continue drinking plenty of water ?Read through the sleep handout for ideas to get more sleep ?

## 2022-05-07 ENCOUNTER — Ambulatory Visit: Payer: BC Managed Care – PPO | Admitting: Dietician

## 2022-05-19 ENCOUNTER — Ambulatory Visit: Payer: BC Managed Care – PPO | Admitting: Registered"

## 2023-09-21 ENCOUNTER — Other Ambulatory Visit: Payer: Self-pay | Admitting: Adult Health

## 2023-09-21 DIAGNOSIS — Z1231 Encounter for screening mammogram for malignant neoplasm of breast: Secondary | ICD-10-CM

## 2023-10-20 ENCOUNTER — Ambulatory Visit
Admission: RE | Admit: 2023-10-20 | Discharge: 2023-10-20 | Disposition: A | Discharge status: Home / Self Care | Payer: BC Managed Care – PPO | Source: Ambulatory Visit | Attending: Adult Health | Admitting: Adult Health

## 2023-10-20 DIAGNOSIS — Z1231 Encounter for screening mammogram for malignant neoplasm of breast: Secondary | ICD-10-CM

## 2023-10-22 ENCOUNTER — Ambulatory Visit
Admission: RE | Admit: 2023-10-22 | Discharge: 2023-10-22 | Disposition: A | Discharge status: Home / Self Care | Payer: BC Managed Care – PPO | Source: Ambulatory Visit | Attending: Family | Admitting: Family

## 2023-10-22 ENCOUNTER — Other Ambulatory Visit: Payer: Self-pay | Admitting: Family

## 2023-10-22 DIAGNOSIS — R0602 Shortness of breath: Secondary | ICD-10-CM

## 2024-08-02 ENCOUNTER — Encounter (HOSPITAL_COMMUNITY): Payer: Self-pay | Admitting: Emergency Medicine

## 2024-08-02 ENCOUNTER — Ambulatory Visit (HOSPITAL_COMMUNITY)
Admission: EM | Admit: 2024-08-02 | Discharge: 2024-08-02 | Disposition: A | Discharge status: Home / Self Care | Attending: Internal Medicine | Admitting: Internal Medicine

## 2024-08-02 DIAGNOSIS — H109 Unspecified conjunctivitis: Secondary | ICD-10-CM

## 2024-08-02 MED ORDER — POLYMYXIN B-TRIMETHOPRIM 10000-0.1 UNIT/ML-% OP SOLN: 2.0000 [drp] | Freq: Four times a day (QID) | OPHTHALMIC | 0 refills | Status: AC

## 2024-08-02 NOTE — ED Provider Notes (Signed)
 [facility]    CSN: [id number] Arrival date & time: 08/02/24  0806     History   Chief Complaint Chief Complaint  Patient presents with   Eye Pain    HPI TALYNN LEBON is a 58 y.o. female.  Here today with right eye irritation for the last 3 days.  She noted eye irritation while working Sunday evening.  Denies visual change but does endorse some light sensitivity.  This morning she has noted cloudy colored drainage from the eye.  She also says that her eye was matted upon waking up.  She tried taking Tylenol  and using saline eyedrops for symptom relief.  Denies fever/chills or left eye irritation.   Past Medical History:  Diagnosis Date   Breast cancer (HCC)    left, Sept 20 2013   Clotting disorder (HCC) 2013   L calf   Coughing    cold recent    Diabetes mellitus without complication (HCC)    on meds   Fibroids    uterine   GERD (gastroesophageal reflux disease)    with certain foods   Hypertension    on meds   Personal history of chemotherapy    2013/2014   Personal history of radiation therapy    2014    Patient Active Problem List   Diagnosis Date Noted   DVT of leg (deep venous thrombosis) (HCC) 02/27/2013   Deep vein thrombosis (DVT) of left lower extremity (HCC) 02/27/2013   Primary cancer of upper outer quadrant of left female breast (HCC) 09/05/2012    Past Surgical History:  Procedure Laterality Date   AXILLARY LYMPH NODE DISSECTION  12/08/2012   Procedure: AXILLARY LYMPH NODE DISSECTION;  Surgeon: Alm VEAR Angle, MD;  Location: [facility];  Service: General;  Laterality: Left;   MASTECTOMY Left    PORTACATH PLACEMENT  12/08/2012   Procedure: INSERTION PORT-A-CATH;  Surgeon: Alm VEAR Angle, MD;  Location: [facility];  Service: General;  Laterality: N/A;  Power Port Placment and Left Axillary Node Dissection   SIMPLE MASTECTOMY W/ SENTINEL NODE BIOPSY  11/03/2012   SIMPLE MASTECTOMY WITH AXILLARY SENTINEL NODE BIOPSY  11/03/2012   Procedure:  SIMPLE MASTECTOMY WITH AXILLARY SENTINEL NODE BIOPSY;  Surgeon: Alm VEAR Angle, MD;  Location: [facility];  Service: General;  Laterality: Left;   WISDOM TOOTH EXTRACTION      OB History   No obstetric history on file.      Home Medications    Prior to Admission medications   Medication Sig Start Date End Date Taking? Authorizing Provider  trimethoprim -polymyxin b  (POLYTRIM ) ophthalmic solution Place 2 drops into the right eye every 6 (six) hours for 5 days. 08/02/24 08/07/24 Yes Eriana Suliman E, MD  atorvastatin (LIPITOR) 20 MG tablet atorvastatin 20 mg tablet  TAKE 1 TABLET BY MOUTH EVERY DAY    [provider]  Carvedilol (COREG PO) Take by mouth daily.    [provider]  irbesartan -hydrochlorothiazide  (AVALIDE) 150-12.5 MG tablet Take 1 tablet by mouth daily. 11/15/18   Odean Potts, MD  metFORMIN  (GLUCOPHAGE ) 1000 MG tablet Take 1 tablet (1,000 mg total) by mouth 2 (two) times daily with a meal. Patient taking differently: Take 500 mg by mouth 2 (two) times daily with a meal. 11/15/18   Odean Potts, MD  sulfamethoxazole -trimethoprim  (BACTRIM  DS) 800-160 MG tablet Take 1 tablet by mouth daily. 11/28/19   Odean Potts, MD    Family History Family History  Problem Relation Age of Onset  Lung cancer Maternal Grandfather    Diabetes Mother    Prostate cancer Father 71   Colon polyps Father 7   Stroke Maternal Grandmother    Lung cancer Cousin        non smoker, died in his 20s; paternal cousin   Lung cancer Cousin        father's maternal cousin; smoker   Stroke Paternal Grandfather    Colon cancer Neg Hx    Esophageal cancer Neg Hx    Stomach cancer Neg Hx    Rectal cancer Neg Hx     Social History Social History   Tobacco Use   Smoking status: Every Day    Current packs/day: 0.25    Average packs/day: 0.3 packs/day for 29.0 years (7.3 ttl pk-yrs)    Types: Cigarettes   Smokeless tobacco: Never  Vaping Use   Vaping status: Never Used  Substance  Use Topics   Alcohol use: Yes    Alcohol/week: 15.0 standard drinks of alcohol    Types: 12 Cans of beer, 3 Glasses of wine per week    Comment: 6 pack/week   Drug use: No     Allergies   Patient has no known allergies.   Review of Systems Review of Systems  Eyes:  Positive for pain.   Physical Exam Triage Vital Signs ED Triage Vitals  Encounter Vitals Group     BP 08/02/24 0822 (!) 176/88     Girls Systolic BP Percentile --      Girls Diastolic BP Percentile --      Boys Systolic BP Percentile --      Boys Diastolic BP Percentile --      Pulse Rate 08/02/24 0822 75     Resp 08/02/24 0822 17     Temp 08/02/24 0822 98.5 F (36.9 C)     Temp Source 08/02/24 0822 Oral     SpO2 08/02/24 0822 98 %     Weight --      Height --      Head Circumference --      Peak Flow --      Pain Score 08/02/24 0821 2     Pain Loc --      Pain Education --      Exclude from Growth Chart --    No data found.  Updated Vital Signs BP (!) 176/88 (BP Location: Right Arm)   Pulse 75   Temp 98.5 F (36.9 C) (Oral)   Resp 17   LMP 11/28/2012   SpO2 98%   Physical Exam Vitals reviewed.  Constitutional:      General: She is not in acute distress.    Appearance: She is normal weight. She is not toxic-appearing.  HENT:     Head: Normocephalic and atraumatic.  Eyes:     General: Lids are normal. Vision grossly intact.        Right eye: Discharge present. No foreign body or hordeolum.        Left eye: No foreign body, discharge or hordeolum.     Extraocular Movements: Extraocular movements intact.     Conjunctiva/sclera:     Right eye: Right conjunctiva is injected.     Left eye: Left conjunctiva is not injected.  Neurological:     Mental Status: She is alert.    UC Treatments / Results  Labs (all labs ordered are listed, but only abnormal results are displayed) Labs Reviewed - No data to display  EKG  Radiology No results found.  Procedures Procedures (including  critical care time)  Medications Ordered in UC Medications - No data to display  Initial Impression / Assessment and Plan / UC Course  I have reviewed the triage vital signs and the nursing notes.  Pertinent labs & imaging results that were available during my care of the patient were reviewed by me and considered in my medical decision making (see chart for details).    58 year old female here today endorsing a 3-day history of right eye irritation and drainage.  On exam there is conjunctival injection with evidence of cloudy colored drainage.  Visual fields and extraocular movements are grossly intact.  She endorses mild light sensitivity.  History and exam findings are concerning for bacterial conjunctivitis.  Treatment options reviewed.  I prescribed Polytrim  ophthalmic drops x 5 days.  She was instructed to return to care if pain or irritation worsens or she develops a loss of vision in the right eye.  Patient is medically stable for discharge at this time.  Final Clinical Impressions(s) / UC Diagnoses   Final diagnoses:  Bacterial conjunctivitis of right eye     Discharge Instructions      Your eye irritation is likely due to pink eye. I have prescribed antibiotic eye drops as we discussed. Return to care if your pain worsens or you experience any changes to your vision. OK to return to work 08/04/24.    ED Prescriptions     Medication Sig Dispense Auth. Provider   trimethoprim -polymyxin b  (POLYTRIM ) ophthalmic solution Place 2 drops into the right eye every 6 (six) hours for 5 days. 10 mL Melvenia Manus BRAVO, MD      PDMP not reviewed this encounter.   Melvenia Manus BRAVO, MD 08/02/24 [phone]

## 2024-08-02 NOTE — Discharge Instructions (Signed)
 Your eye irritation is likely due to pink eye. I have prescribed antibiotic eye drops as we discussed. Return to care if your pain worsens or you experience any changes to your vision. OK to return to work 08/04/24.

## 2024-08-02 NOTE — ED Triage Notes (Signed)
 Pt reports Sunday night worked a 16 hour shift. Noticed right eye irritation. When woke up Monday had swelling and pinkness to right eye. Yesterday eye became sore and had some drainage. Denies visual impairment but does have light sensitivity.

## 2024-11-13 ENCOUNTER — Encounter: Payer: Self-pay | Admitting: Physician Assistant

## 2024-11-22 ENCOUNTER — Emergency Department (HOSPITAL_BASED_OUTPATIENT_CLINIC_OR_DEPARTMENT_OTHER)
Admission: EM | Admit: 2024-11-22 | Discharge: 2024-11-22 | Disposition: A | Discharge status: Home / Self Care | Attending: Emergency Medicine | Admitting: Emergency Medicine

## 2024-11-22 ENCOUNTER — Encounter (HOSPITAL_BASED_OUTPATIENT_CLINIC_OR_DEPARTMENT_OTHER): Payer: Self-pay | Admitting: Emergency Medicine

## 2024-11-22 ENCOUNTER — Emergency Department (HOSPITAL_BASED_OUTPATIENT_CLINIC_OR_DEPARTMENT_OTHER)

## 2024-11-22 ENCOUNTER — Telehealth: Payer: Self-pay | Admitting: *Deleted

## 2024-11-22 ENCOUNTER — Other Ambulatory Visit: Payer: Self-pay

## 2024-11-22 DIAGNOSIS — R1010 Upper abdominal pain, unspecified: Secondary | ICD-10-CM

## 2024-11-22 DIAGNOSIS — K2100 Gastro-esophageal reflux disease with esophagitis, without bleeding: Secondary | ICD-10-CM | POA: Insufficient documentation

## 2024-11-22 DIAGNOSIS — R7989 Other specified abnormal findings of blood chemistry: Secondary | ICD-10-CM

## 2024-11-22 DIAGNOSIS — Z853 Personal history of malignant neoplasm of breast: Secondary | ICD-10-CM | POA: Insufficient documentation

## 2024-11-22 LAB — COMPREHENSIVE METABOLIC PANEL WITH GFR
ALT: 178 U/L — ABNORMAL HIGH (ref 0–44)
AST: 335 U/L — ABNORMAL HIGH (ref 15–41)
Albumin: 3.3 g/dL — ABNORMAL LOW (ref 3.5–5.0)
Alkaline Phosphatase: 297 U/L — ABNORMAL HIGH (ref 38–126)
Anion gap: 17 — ABNORMAL HIGH (ref 5–15)
BUN: 19 mg/dL (ref 6–20)
CO2: 19 mmol/L — ABNORMAL LOW (ref 22–32)
Calcium: 9 mg/dL (ref 8.9–10.3)
Chloride: 99 mmol/L (ref 98–111)
Creatinine, Ser: 0.97 mg/dL (ref 0.44–1.00)
GFR, Estimated: >60 mL/min (ref >60)
Glucose, Bld: 163 mg/dL — ABNORMAL HIGH (ref 70–99)
Potassium: 3.9 mmol/L (ref 3.5–5.1)
Sodium: 135 mmol/L (ref 135–145)
Total Bilirubin: 0.6 mg/dL (ref 0.0–1.2)
Total Protein: 6.7 g/dL (ref 6.5–8.1)

## 2024-11-22 LAB — CBC WITH DIFFERENTIAL/PLATELET
Abs Immature Granulocytes: 0.03 K/uL (ref 0.00–0.07)
Basophils Absolute: 0.1 K/uL (ref 0.0–0.1)
Basophils Relative: 1 %
Eosinophils Absolute: 0.1 K/uL (ref 0.0–0.5)
Eosinophils Relative: 1 %
HCT: 38.2 % (ref 36.0–46.0)
Hemoglobin: 13.4 g/dL (ref 12.0–15.0)
Immature Granulocytes: 0 %
Lymphocytes Relative: 42 %
Lymphs Abs: 3.1 K/uL (ref 0.7–4.0)
MCH: 31.5 pg (ref 26.0–34.0)
MCHC: 35.1 g/dL (ref 30.0–36.0)
MCV: 89.9 fL (ref 80.0–100.0)
Monocytes Absolute: 0.6 K/uL (ref 0.1–1.0)
Monocytes Relative: 7 %
Neutro Abs: 3.5 K/uL (ref 1.7–7.7)
Neutrophils Relative %: 49 %
Platelets: 249 K/uL (ref 150–400)
RBC: 4.25 MIL/uL (ref 3.87–5.11)
RDW: 13.7 % (ref 11.5–15.5)
WBC: 7.4 K/uL (ref 4.0–10.5)
nRBC: 0 % (ref 0.0–0.2)

## 2024-11-22 LAB — LIPASE, BLOOD: Lipase: 136 U/L — ABNORMAL HIGH (ref 11–51)

## 2024-11-22 LAB — TROPONIN T, HIGH SENSITIVITY: Troponin T High Sensitivity: <15 ng/L (ref 0–19)

## 2024-11-22 MED ORDER — IOHEXOL 300 MG/ML  SOLN
100.0000 mL | Freq: Once | INTRAMUSCULAR | Status: AC | PRN
  Administered 2024-11-22: 100 mL via INTRAVENOUS

## 2024-11-22 MED ORDER — SODIUM CHLORIDE 0.9 % IV BOLUS
1000.0000 mL | Freq: Once | INTRAVENOUS | Status: AC
  Administered 2024-11-22: 1000 mL via INTRAVENOUS

## 2024-11-22 MED ORDER — FAMOTIDINE IN NACL 20-0.9 MG/50ML-% IV SOLN
20.0000 mg | Freq: Once | INTRAVENOUS | Status: AC
  Administered 2024-11-22: 20 mg via INTRAVENOUS
  Filled 2024-11-22: qty 50

## 2024-11-22 MED ORDER — MORPHINE SULFATE (PF) 4 MG/ML IV SOLN
4.0000 mg | Freq: Once | INTRAVENOUS | Status: AC
  Administered 2024-11-22: 4 mg via INTRAVENOUS
  Filled 2024-11-22: qty 1

## 2024-11-22 NOTE — Telephone Encounter (Signed)
 Per Dr LeighGLENWOOD Doughty can you place an order and let patient know to come to the lab for CBC, LFTs, lipase on Friday of this week - she is being discharged from the ED today. Thanks

## 2024-11-22 NOTE — ED Notes (Signed)
 MD at bedside.

## 2024-11-22 NOTE — Progress Notes (Signed)
 Called by [facility] emergency department today.  Patient evaluated there today for upper abdominal pain intermittently since Thanksgiving.  Workup remarkable for elevated LFTs, mildly elevated lipase.   In the ED her ultrasound was negative for gallstones , bile duct dilation, or acute cholecystitis.   CT scan showed no evidence for pancreatitis or bile duct dilation.   Case discussed with Dr. Leigh.  EDP feels like patient's pain is able to be managed as an outpatient but wants timely outpatient follow-up..    I will send a message to Dr. Hassan nurse asking them to arrange for repeat LFTs on Monday.  I will also send a message to our scheduler to contact this patient for an appointment with Dr. Leigh or one of the advanced practitioners in our office.

## 2024-11-22 NOTE — Discharge Instructions (Addendum)
 [facility] will see you on MOnday and call to clarify appointment. In case this is acid reflux start taking Prilosec and take it for the next 2 weeks.  This is over-the-counter at any pharmacy.  This is not a medicine that you take as needed.  It takes a few days to start working.  You can try Tums and stop the medicine starts helping. Follow-up closely with your primary doctor and you may need to see [facility] if this continues. Use Tylenol  every 4 as needed for pain.

## 2024-11-22 NOTE — ED Provider Notes (Signed)
 [place] EMERGENCY DEPARTMENT AT [facility] Provider Note   CSN: [id number] Arrival date & time: 11/22/24  9197     Patient presents with: Abdominal Pain   SARAPHINA Hale is a 58 y.o. female.   Patient with history of high blood pressure, cholesterol, breast cancer history presents for recurrent upper abdominal discomfort nonradiating worse in Thanksgiving.  Worse after eating any types of foods.  No shortness of breath with this.  No blood or melena stools.  No history of EGD.  No history of known acid reflux.  No cardiac history known.  The history is provided by the patient.  Abdominal Pain Associated symptoms: no chest pain, no chills, no dysuria, no fever, no shortness of breath and no vomiting        Prior to Admission medications   Medication Sig Start Date End Date Taking? Authorizing Provider  atorvastatin (LIPITOR) 20 MG tablet atorvastatin 20 mg tablet  TAKE 1 TABLET BY MOUTH EVERY DAY    [provider]  Carvedilol (COREG PO) Take by mouth daily.    [provider]  irbesartan -hydrochlorothiazide  (AVALIDE) 150-12.5 MG tablet Take 1 tablet by mouth daily. 11/15/18   Gudena, Vinay, MD  metFORMIN  (GLUCOPHAGE ) 1000 MG tablet Take 1 tablet (1,000 mg total) by mouth 2 (two) times daily with a meal. Patient taking differently: Take 500 mg by mouth 2 (two) times daily with a meal. 11/15/18   Odean Potts, MD  sulfamethoxazole -trimethoprim  (BACTRIM  DS) 800-160 MG tablet Take 1 tablet by mouth daily. 11/28/19   Gudena, Vinay, MD    Allergies: Patient has no known allergies.    Review of Systems  Constitutional:  Negative for chills and fever.  HENT:  Negative for congestion.   Eyes:  Negative for visual disturbance.  Respiratory:  Negative for shortness of breath.   Cardiovascular:  Negative for chest pain.  Gastrointestinal:  Positive for abdominal pain. Negative for vomiting.  Genitourinary:  Negative for dysuria and flank pain.   Musculoskeletal:  Negative for back pain, neck pain and neck stiffness.  Skin:  Negative for rash.  Neurological:  Negative for light-headedness and headaches.    Updated Vital Signs BP (!) 156/79   Pulse 88   Temp 98.4 F (36.9 C)   Resp 12   Wt 68 kg   LMP 11/28/2012   SpO2 100%   BMI 25.75 kg/m   Physical Exam Vitals and nursing note reviewed.  Constitutional:      General: She is not in acute distress.    Appearance: She is well-developed.  HENT:     Head: Normocephalic and atraumatic.     Mouth/Throat:     Mouth: Mucous membranes are moist.  Eyes:     General:        Right eye: No discharge.        Left eye: No discharge.     Conjunctiva/sclera: Conjunctivae normal.  Neck:     Trachea: No tracheal deviation.  Cardiovascular:     Rate and Rhythm: Normal rate and regular rhythm.     Heart sounds: No murmur heard. Pulmonary:     Effort: Pulmonary effort is normal.     Breath sounds: Normal breath sounds.  Abdominal:     General: There is no distension.     Palpations: Abdomen is soft.     Tenderness: There is abdominal tenderness in the right upper quadrant and epigastric area. There is no guarding.  Musculoskeletal:     Cervical back: Normal  range of motion and neck supple. No rigidity.  Skin:    General: Skin is warm.     Capillary Refill: Capillary refill takes less than 2 seconds.     Findings: No rash.  Neurological:     General: No focal deficit present.     Mental Status: She is alert.     Cranial Nerves: No cranial nerve deficit.  Psychiatric:        Mood and Affect: Mood normal.     (all labs ordered are listed, but only abnormal results are displayed) Labs Reviewed  COMPREHENSIVE METABOLIC PANEL WITH GFR - Abnormal; Notable for the following components:      Result Value   CO2 19 (*)    Glucose, Bld 163 (*)    Albumin 3.3 (*)    AST 335 (*)    ALT 178 (*)    Alkaline Phosphatase 297 (*)    Anion gap 17 (*)    All other components  within normal limits  LIPASE, BLOOD - Abnormal; Notable for the following components:   Lipase 136 (*)    All other components within normal limits  CBC WITH DIFFERENTIAL/PLATELET  TROPONIN T, HIGH SENSITIVITY    EKG: EKG Interpretation Date/Time:  Wednesday November 22 2024 08:14:04 EST Ventricular Rate:  95 PR Interval:  122 QRS Duration:  116 QT Interval:  351 QTC Calculation: 442 R Axis:   37  Text Interpretation: Sinus rhythm Probable left atrial enlargement Nonspecific intraventricular conduction delay Confirmed by Tonia Chew [phone]) on 11/22/2024 8:16:40 AM  Radiology: US  Abdomen Limited Result Date: 11/22/2024 CLINICAL DATA:  Epigastric pain EXAM: ULTRASOUND ABDOMEN LIMITED RIGHT UPPER QUADRANT COMPARISON:  CT scan of the abdomen and pelvis performed concurrently today FINDINGS: Gallbladder: No gallstones or wall thickening visualized. No sonographic Murphy sign noted by sonographer. Common bile duct: Diameter: Normal at 4 mm Liver: Normal parenchymal echogenicity. Ill-defined slightly hypoechoic soft tissue lesion in hepatic segment 2 measures 2.7 x 1.8 x 2.4 cm. A cavernous hemangioma was visualized at the same location on the CT scan from earlier today. No suspicious lesions are visualized. Portal vein is patent on color Doppler imaging with normal direction of blood flow towards the liver. Other: None. IMPRESSION: 1. No evidence of cholelithiasis or biliary ductal dilatation. 2. Hepatic hemangioma as seen on CT scan obtained earlier today. Electronically Signed   By: Wilkie Lent M.D.   On: 11/22/2024 12:51   CT ABDOMEN PELVIS W CONTRAST Result Date: 11/22/2024 CLINICAL DATA:  Upper abdominal pain. Epigastric pain that has been worsening for several weeks. EXAM: CT ABDOMEN AND PELVIS WITH CONTRAST TECHNIQUE: Multidetector CT imaging of the abdomen and pelvis was performed using the standard protocol following bolus administration of intravenous contrast. RADIATION DOSE  REDUCTION: This exam was performed according to the departmental dose-optimization program which includes automated exposure control, adjustment of the mA and/or kV according to patient size and/or use of iterative reconstruction technique. CONTRAST:  100mL OMNIPAQUE IOHEXOL 300 MG/ML  SOLN COMPARISON:  None Available. FINDINGS: Lower chest: Hest moderate hiatal hernia. Hepatobiliary: 3.5 cm benign cavernous hemangioma identified lateral segment left liver. No followup imaging is recommended. There is no evidence for gallstones, gallbladder wall thickening, or pericholecystic fluid. No intrahepatic or extrahepatic biliary dilation. Pancreas: No focal mass lesion. No dilatation of the main duct. No intraparenchymal cyst. No peripancreatic edema. Spleen: No splenomegaly. No suspicious focal mass lesion. Adrenals/Urinary Tract: No adrenal nodule or mass. Right kidney unremarkable. 6 x 4 mm nonobstructing stone identified  lower pole left kidney No evidence for hydroureter. The urinary bladder appears normal for the degree of distention. Stomach/Bowel: Moderate hiatal hernia. Stomach otherwise unremarkable. Duodenum is normally positioned as is the ligament of Treitz. No small bowel wall thickening. No small bowel dilatation. The terminal ileum is normal. The appendix is normal. No gross colonic mass. No colonic wall thickening. Vascular/Lymphatic: There is moderate atherosclerotic calcification of the abdominal aorta without aneurysm. There is no gastrohepatic or hepatoduodenal ligament lymphadenopathy. No retroperitoneal or mesenteric lymphadenopathy. No pelvic sidewall lymphadenopathy. Reproductive: Calcified uterine fibroids evident. There is no adnexal mass. Other: No intraperitoneal free fluid. Musculoskeletal: No worrisome lytic or sclerotic osseous abnormality. IMPRESSION: 1. No acute findings in the abdomen or pelvis. Specifically, no findings to explain the patient's history of upper abdominal pain. 2. Moderate  hiatal hernia. 3. 6 x 4 mm nonobstructing stone lower pole left kidney. 4. Calcified uterine fibroids. 5.  Aortic Atherosclerosis (ICD10-I70.0). Electronically Signed   By: Camellia Candle M.D.   On: 11/22/2024 10:23     Ultrasound ED Abd  Date/Time: 11/22/2024 9:06 AM  Performed by: Tonia Chew, MD Authorized by: Tonia Chew, MD   Procedure details:    Indications: abdominal pain     Assessment for:  Gallstones   Hepatobiliary:  Visualized    Images: archived    Hepatobiliary findings:    Common bile duct:  Normal   Gallbladder wall:  Normal   Gallbladder stones: not identified      Medications Ordered in the ED  famotidine  (PEPCID ) IVPB 20 mg premix (0 mg Intravenous Stopped 11/22/24 0858)  iohexol (OMNIPAQUE) 300 MG/ML solution 100 mL (100 mLs Intravenous Contrast Given 11/22/24 0938)  sodium chloride  0.9 % bolus 1,000 mL ( Intravenous Stopped 11/22/24 1146)  morphine  (PF) 4 MG/ML injection 4 mg (4 mg Intravenous Given 11/22/24 1039)                                    Medical Decision Making Amount and/or Complexity of Data Reviewed Labs: ordered. Radiology: ordered.  Risk Prescription drug management.   Patient presents with worsening upper abdominal discomfort differential includes biliary/gallstones, acid reflux/gastritis related/ulcer, pancreatitis, hiatal hernia/mass, colon related, atypical cardiac, other.  With pain being reproducible likely abdominal related.  EKG independently reviewed no acute ST elevation sinus rhythm.  Blood work sent lipase normal no signs of pancreatitis, liver function, kidney function unremarkable.  Normal white blood cell count normal hemoglobin no evidence of bleeding.  Patient's blood work showed mild elevated lipase and mild elevated LFTs.  Consult to gastroenterology placed.  Bedside ultrasound no signs of gallstones or cholecystitis.  CT abdomen pelvis no acute abnormalities, kidney stone in lower pole of kidney noted and  chronic findings.  Ultrasound results independent reviewed similar to bedside ultrasound no acute abnormalities.  Discussed the case again with Vina Dasen on-call for [facility].  They are able to arrange close outpatient follow-up on Monday.  Patient's pain controlled and she is comfortable with that plan to help keep her out of the hospital.     Final diagnoses:  Upper abdominal pain  Gastroesophageal reflux disease with esophagitis without hemorrhage  LFT elevation    ED Discharge Orders     None          Tonia Chew, MD 11/22/24 1355

## 2024-11-22 NOTE — Telephone Encounter (Signed)
 Orders placed in EPIC for CBC, LFT's, lipase to be completed 11/24/24.

## 2024-11-22 NOTE — Progress Notes (Signed)
 Vina - thanks for the update. I wonder if she passed a stone. I'd recommend repeating her LFTs in 48 hours - Friday - just to make sure stable and downtrending before the weekend.   Dottie can you help order? Thanks

## 2024-11-22 NOTE — ED Triage Notes (Signed)
 Pt endorses epigastric pain for awhile. Worsening since Thanksgiving. Reports pain increases after eating

## 2024-11-22 NOTE — ED Notes (Signed)
 Patient transported to CT via wheelchair. NAD at this time.

## 2024-11-22 NOTE — Telephone Encounter (Signed)
-----   Message from Vina Dasen sent at 11/22/2024  1:29 PM EST ----- Hello, Can one of you please order LFTs under Dr. Hassan name to be done on this patient on Monday the 14th.  Diagnosis is elevated LFTs and upper abdominal pain.  You can see a phone note that I have placed in the chart from [facility].  The patient will need to be contacted with instructions to come in for this blood work.    Thanks

## 2024-11-23 NOTE — Telephone Encounter (Signed)
 I have spoken to patient to advise that she should come to [facility] lab tomorrow for repeat labs. She verbalizes understanding.

## 2024-11-24 ENCOUNTER — Other Ambulatory Visit: Payer: Self-pay

## 2024-11-24 ENCOUNTER — Inpatient Hospital Stay (HOSPITAL_BASED_OUTPATIENT_CLINIC_OR_DEPARTMENT_OTHER)
Admission: EM | Admit: 2024-11-24 | Discharge: 2024-11-30 | Disposition: A | Source: Ambulatory Visit | Attending: Internal Medicine | Admitting: Internal Medicine

## 2024-11-24 ENCOUNTER — Ambulatory Visit: Payer: Self-pay | Admitting: Gastroenterology

## 2024-11-24 ENCOUNTER — Other Ambulatory Visit (INDEPENDENT_AMBULATORY_CARE_PROVIDER_SITE_OTHER)

## 2024-11-24 ENCOUNTER — Emergency Department (HOSPITAL_BASED_OUTPATIENT_CLINIC_OR_DEPARTMENT_OTHER)

## 2024-11-24 DIAGNOSIS — F10139 Alcohol abuse with withdrawal, unspecified: Secondary | ICD-10-CM | POA: Diagnosis present

## 2024-11-24 DIAGNOSIS — K2980 Duodenitis without bleeding: Secondary | ICD-10-CM | POA: Diagnosis present

## 2024-11-24 DIAGNOSIS — E663 Overweight: Secondary | ICD-10-CM | POA: Diagnosis present

## 2024-11-24 DIAGNOSIS — Z853 Personal history of malignant neoplasm of breast: Secondary | ICD-10-CM

## 2024-11-24 DIAGNOSIS — R000 Tachycardia, unspecified: Secondary | ICD-10-CM | POA: Diagnosis present

## 2024-11-24 DIAGNOSIS — R7401 Elevation of levels of liver transaminase levels: Secondary | ICD-10-CM | POA: Diagnosis present

## 2024-11-24 DIAGNOSIS — Z9012 Acquired absence of left breast and nipple: Secondary | ICD-10-CM

## 2024-11-24 DIAGNOSIS — R1010 Upper abdominal pain, unspecified: Secondary | ICD-10-CM

## 2024-11-24 DIAGNOSIS — Z801 Family history of malignant neoplasm of trachea, bronchus and lung: Secondary | ICD-10-CM

## 2024-11-24 DIAGNOSIS — Z6826 Body mass index (BMI) 26.0-26.9, adult: Secondary | ICD-10-CM

## 2024-11-24 DIAGNOSIS — F17210 Nicotine dependence, cigarettes, uncomplicated: Secondary | ICD-10-CM | POA: Diagnosis present

## 2024-11-24 DIAGNOSIS — F32A Depression, unspecified: Secondary | ICD-10-CM | POA: Diagnosis present

## 2024-11-24 DIAGNOSIS — E876 Hypokalemia: Secondary | ICD-10-CM | POA: Diagnosis present

## 2024-11-24 DIAGNOSIS — K852 Alcohol induced acute pancreatitis without necrosis or infection: Principal | ICD-10-CM | POA: Diagnosis present

## 2024-11-24 DIAGNOSIS — Z823 Family history of stroke: Secondary | ICD-10-CM

## 2024-11-24 DIAGNOSIS — R7989 Other specified abnormal findings of blood chemistry: Secondary | ICD-10-CM | POA: Diagnosis present

## 2024-11-24 DIAGNOSIS — E8721 Acute metabolic acidosis: Secondary | ICD-10-CM | POA: Diagnosis present

## 2024-11-24 DIAGNOSIS — K219 Gastro-esophageal reflux disease without esophagitis: Secondary | ICD-10-CM | POA: Diagnosis present

## 2024-11-24 DIAGNOSIS — R748 Abnormal levels of other serum enzymes: Secondary | ICD-10-CM | POA: Diagnosis present

## 2024-11-24 DIAGNOSIS — F419 Anxiety disorder, unspecified: Secondary | ICD-10-CM | POA: Diagnosis present

## 2024-11-24 DIAGNOSIS — Z79899 Other long term (current) drug therapy: Secondary | ICD-10-CM

## 2024-11-24 DIAGNOSIS — Z7984 Long term (current) use of oral hypoglycemic drugs: Secondary | ICD-10-CM

## 2024-11-24 DIAGNOSIS — Z83719 Family history of colon polyps, unspecified: Secondary | ICD-10-CM

## 2024-11-24 DIAGNOSIS — K2100 Gastro-esophageal reflux disease with esophagitis, without bleeding: Secondary | ICD-10-CM | POA: Diagnosis present

## 2024-11-24 DIAGNOSIS — E119 Type 2 diabetes mellitus without complications: Secondary | ICD-10-CM | POA: Diagnosis present

## 2024-11-24 DIAGNOSIS — K449 Diaphragmatic hernia without obstruction or gangrene: Secondary | ICD-10-CM | POA: Diagnosis present

## 2024-11-24 DIAGNOSIS — M109 Gout, unspecified: Secondary | ICD-10-CM | POA: Diagnosis present

## 2024-11-24 DIAGNOSIS — I10 Essential (primary) hypertension: Secondary | ICD-10-CM | POA: Diagnosis present

## 2024-11-24 DIAGNOSIS — Z86718 Personal history of other venous thrombosis and embolism: Secondary | ICD-10-CM

## 2024-11-24 DIAGNOSIS — Z833 Family history of diabetes mellitus: Secondary | ICD-10-CM

## 2024-11-24 DIAGNOSIS — Z634 Disappearance and death of family member: Secondary | ICD-10-CM

## 2024-11-24 DIAGNOSIS — Z923 Personal history of irradiation: Secondary | ICD-10-CM

## 2024-11-24 DIAGNOSIS — Z9221 Personal history of antineoplastic chemotherapy: Secondary | ICD-10-CM

## 2024-11-24 DIAGNOSIS — Z8042 Family history of malignant neoplasm of prostate: Secondary | ICD-10-CM

## 2024-11-24 LAB — BASIC METABOLIC PANEL WITH GFR
Anion gap: 13 (ref 5–15)
BUN: 9 mg/dL (ref 6–20)
CO2: 19 mmol/L — ABNORMAL LOW (ref 22–32)
Calcium: 8.1 mg/dL — ABNORMAL LOW (ref 8.9–10.3)
Chloride: 103 mmol/L (ref 98–111)
Creatinine, Ser: 1.08 mg/dL — ABNORMAL HIGH (ref 0.44–1.00)
GFR, Estimated: 60 mL/min — ABNORMAL LOW (ref >60)
Glucose, Bld: 117 mg/dL — ABNORMAL HIGH (ref 70–99)
Potassium: 2.9 mmol/L — ABNORMAL LOW (ref 3.5–5.1)
Sodium: 136 mmol/L (ref 135–145)

## 2024-11-24 LAB — CBC WITH DIFFERENTIAL/PLATELET
Basophils Absolute: 0.1 K/uL (ref 0.0–0.1)
Basophils Relative: 1.1 % (ref 0.0–3.0)
Eosinophils Absolute: 0.1 K/uL (ref 0.0–0.7)
Eosinophils Relative: 0.5 % (ref 0.0–5.0)
HCT: 38.1 % (ref 36.0–46.0)
Hemoglobin: 12.9 g/dL (ref 12.0–15.0)
Lymphocytes Relative: 13.6 % (ref 12.0–46.0)
Lymphs Abs: 1.8 K/uL (ref 0.7–4.0)
MCHC: 33.9 g/dL (ref 30.0–36.0)
MCV: 92.5 fl (ref 78.0–100.0)
Monocytes Absolute: 0.7 K/uL (ref 0.1–1.0)
Monocytes Relative: 5.6 % (ref 3.0–12.0)
Neutro Abs: 10.3 K/uL — ABNORMAL HIGH (ref 1.4–7.7)
Neutrophils Relative %: 79.2 % — ABNORMAL HIGH (ref 43.0–77.0)
Platelets: 219 K/uL (ref 150.0–400.0)
RBC: 4.12 Mil/uL (ref 3.87–5.11)
RDW: 14.5 % (ref 11.5–15.5)
WBC: 13 K/uL — ABNORMAL HIGH (ref 4.0–10.5)

## 2024-11-24 LAB — HEPATIC FUNCTION PANEL
ALT: 84 U/L — ABNORMAL HIGH (ref 0–35)
AST: 116 U/L — ABNORMAL HIGH (ref 0–37)
Albumin: 3.4 g/dL — ABNORMAL LOW (ref 3.5–5.2)
Alkaline Phosphatase: 204 U/L — ABNORMAL HIGH (ref 39–117)
Bilirubin, Direct: 0.5 mg/dL — ABNORMAL HIGH (ref 0.0–0.3)
Total Bilirubin: 1.4 mg/dL — ABNORMAL HIGH (ref 0.2–1.2)
Total Protein: 7 g/dL (ref 6.0–8.3)

## 2024-11-24 LAB — LIPASE: Lipase: 2955 U/L — ABNORMAL HIGH (ref 11.0–59.0)

## 2024-11-24 MED ORDER — SODIUM CHLORIDE 0.9 % IV SOLN: Freq: Once | INTRAVENOUS | Status: AC

## 2024-11-24 MED ORDER — HYDROMORPHONE HCL 1 MG/ML IJ SOLN
1.0000 mg | Freq: Once | INTRAMUSCULAR | Status: AC
  Administered 2024-11-24: 1 mg via INTRAVENOUS
  Filled 2024-11-24: qty 1

## 2024-11-24 MED ORDER — THIAMINE HCL 100 MG/ML IJ SOLN
100.0000 mg | Freq: Every day | INTRAMUSCULAR | Status: DC
  Administered 2024-11-24: 100 mg via INTRAVENOUS
  Filled 2024-11-24 (×3): qty 2

## 2024-11-24 MED ORDER — SODIUM CHLORIDE 0.9 % IV BOLUS
1000.0000 mL | Freq: Once | INTRAVENOUS | Status: AC
  Administered 2024-11-24: 1000 mL via INTRAVENOUS

## 2024-11-24 MED ORDER — POTASSIUM CHLORIDE 10 MEQ/100ML IV SOLN
10.0000 meq | INTRAVENOUS | Status: AC
  Administered 2024-11-24 (×3): 10 meq via INTRAVENOUS
  Filled 2024-11-24 (×3): qty 100

## 2024-11-24 MED ORDER — LORAZEPAM 1 MG PO TABS: 0.0000 mg | ORAL_TABLET | Freq: Two times a day (BID) | ORAL | Status: DC

## 2024-11-24 MED ORDER — HYDRALAZINE HCL 20 MG/ML IJ SOLN
10.0000 mg | Freq: Once | INTRAMUSCULAR | Status: AC
  Administered 2024-11-24: 10 mg via INTRAVENOUS
  Filled 2024-11-24: qty 1

## 2024-11-24 MED ORDER — THIAMINE MONONITRATE 100 MG PO TABS
100.0000 mg | ORAL_TABLET | Freq: Every day | ORAL | Status: DC
  Administered 2024-11-25 – 2024-11-30 (×6): 100 mg via ORAL
  Filled 2024-11-24 (×6): qty 1

## 2024-11-24 MED ORDER — LORAZEPAM 2 MG/ML IJ SOLN: 0.0000 mg | Freq: Two times a day (BID) | INTRAMUSCULAR | Status: DC

## 2024-11-24 MED ORDER — LORAZEPAM 1 MG PO TABS
0.0000 mg | ORAL_TABLET | Freq: Four times a day (QID) | ORAL | Status: AC
  Administered 2024-11-25 – 2024-11-26 (×2): 1 mg via ORAL
  Filled 2024-11-24 (×2): qty 1

## 2024-11-24 MED ORDER — ONDANSETRON HCL 4 MG/2ML IJ SOLN
4.0000 mg | Freq: Once | INTRAMUSCULAR | Status: AC
  Administered 2024-11-24: 4 mg via INTRAVENOUS
  Filled 2024-11-24: qty 2

## 2024-11-24 MED ORDER — LORAZEPAM 2 MG/ML IJ SOLN: 0.0000 mg | Freq: Four times a day (QID) | INTRAMUSCULAR | Status: AC

## 2024-11-24 MED ORDER — IOHEXOL 300 MG/ML  SOLN
100.0000 mL | Freq: Once | INTRAMUSCULAR | Status: AC | PRN
  Administered 2024-11-24: 100 mL via INTRAVENOUS

## 2024-11-24 NOTE — ED Provider Notes (Signed)
 [place] EMERGENCY DEPARTMENT AT [facility] Provider Note   CSN: [id number] Arrival date & time: 11/24/24  1533     Patient presents with: Abdominal Pain   Hannah Hale is a 58 y.o. female.   Patient sent here by GI for admission for acute pancreatitis.  She admits to alcohol use.  Seen here couple days ago for the same but got worse she had elevated liver enzymes followed up with GI today rechecked her labs and her lipase was very high.  She has history of diabetes reflux hypertension.  Drinks alcohol daily.  History of reflux.  The history is provided by the patient.       Prior to Admission medications  Medication Sig Start Date End Date Taking? Authorizing Provider  atorvastatin (LIPITOR) 20 MG tablet atorvastatin 20 mg tablet  TAKE 1 TABLET BY MOUTH EVERY DAY    [provider]  Carvedilol (COREG PO) Take by mouth daily.    [provider]  irbesartan -hydrochlorothiazide  (AVALIDE) 150-12.5 MG tablet Take 1 tablet by mouth daily. 11/15/18   Gudena, Vinay, MD  metFORMIN  (GLUCOPHAGE ) 1000 MG tablet Take 1 tablet (1,000 mg total) by mouth 2 (two) times daily with a meal. Patient taking differently: Take 500 mg by mouth 2 (two) times daily with a meal. 11/15/18   Odean Potts, MD  sulfamethoxazole -trimethoprim  (BACTRIM  DS) 800-160 MG tablet Take 1 tablet by mouth daily. 11/28/19   Gudena, Vinay, MD    Allergies: Patient has no known allergies.    Review of Systems  Updated Vital Signs BP (!) 143/103 (BP Location: Right Arm)   Pulse (!) 118   Temp (!) 97.5 F (36.4 C)   Resp 16   Ht 5' 4 (1.626 m)   Wt 68 kg   LMP 11/28/2012   SpO2 97%   BMI 25.75 kg/m   Physical Exam Vitals and nursing note reviewed.  Constitutional:      General: She is not in acute distress.    Appearance: She is well-developed.  HENT:     Head: Normocephalic and atraumatic.  Eyes:     Conjunctiva/sclera: Conjunctivae normal.  Cardiovascular:     Rate  and Rhythm: Normal rate and regular rhythm.     Heart sounds: No murmur heard. Pulmonary:     Effort: Pulmonary effort is normal. No respiratory distress.     Breath sounds: Normal breath sounds.  Abdominal:     Palpations: Abdomen is soft.     Tenderness: There is abdominal tenderness.  Musculoskeletal:        General: No swelling.     Cervical back: Neck supple.  Skin:    General: Skin is warm and dry.     Capillary Refill: Capillary refill takes less than 2 seconds.  Neurological:     Mental Status: She is alert.  Psychiatric:        Mood and Affect: Mood normal.     (all labs ordered are listed, but only abnormal results are displayed) Labs Reviewed - No data to display  EKG: None  Radiology: No results found.   Procedures   Medications Ordered in the ED  HYDROmorphone  (DILAUDID ) injection 1 mg (has no administration in time range)  sodium chloride  0.9 % bolus 1,000 mL (has no administration in time range)  ondansetron  (ZOFRAN ) injection 4 mg (has no administration in time range)  Medical Decision Making Amount and/or Complexity of Data Reviewed Radiology: ordered.  Risk Prescription drug management. Decision regarding hospitalization.   Hannah Hale is here for admission for acute pancreatitis.  She is mildly tachycardic but otherwise normal vitals.  She actually had a CT scan and ultrasound done a couple days ago that was unremarkable.  No gallstones.  No evidence of pancreatitis at that time.  She did have some elevated liver enzymes.  Followed up with GI today.  Repeat lipase was almost 3000.  Sent for admission.  She has had some pain and nausea.  Overall we will give IV fluids pain meds and admit to medicine.  Will get a repeat CT scan to see if there is any major changes.  She has no fever.  Do not have any concern for any infectious process at this time.  Do suspect that this is from alcohol.  This chart was  dictated using voice recognition software.  Despite best efforts to proofread,  errors can occur which can change the documentation meaning.      Final diagnoses:  Alcohol-induced acute pancreatitis, unspecified complication status    ED Discharge Orders     None          Ruthe Cornet, DO 11/24/24 1613

## 2024-11-24 NOTE — ED Notes (Signed)
 Pt to CT. IV w fluids/potassium paused for transport

## 2024-11-24 NOTE — ED Notes (Signed)
 Pt returned, placed back on cardiac monitor, IV fluids/ potassium restarted

## 2024-11-24 NOTE — ED Triage Notes (Signed)
 Pt POV reporting persistent abd pain, seen by GI, lipase elevated, advised to be seen in ED for further evaluation.

## 2024-11-25 DIAGNOSIS — R1013 Epigastric pain: Secondary | ICD-10-CM | POA: Diagnosis not present

## 2024-11-25 DIAGNOSIS — F10139 Alcohol abuse with withdrawal, unspecified: Secondary | ICD-10-CM | POA: Diagnosis present

## 2024-11-25 DIAGNOSIS — Z83719 Family history of colon polyps, unspecified: Secondary | ICD-10-CM | POA: Diagnosis not present

## 2024-11-25 DIAGNOSIS — Z853 Personal history of malignant neoplasm of breast: Secondary | ICD-10-CM | POA: Diagnosis not present

## 2024-11-25 DIAGNOSIS — Z833 Family history of diabetes mellitus: Secondary | ICD-10-CM | POA: Diagnosis not present

## 2024-11-25 DIAGNOSIS — I10 Essential (primary) hypertension: Secondary | ICD-10-CM | POA: Diagnosis present

## 2024-11-25 DIAGNOSIS — R748 Abnormal levels of other serum enzymes: Secondary | ICD-10-CM

## 2024-11-25 DIAGNOSIS — E876 Hypokalemia: Secondary | ICD-10-CM | POA: Diagnosis present

## 2024-11-25 DIAGNOSIS — K3189 Other diseases of stomach and duodenum: Secondary | ICD-10-CM | POA: Diagnosis not present

## 2024-11-25 DIAGNOSIS — Z823 Family history of stroke: Secondary | ICD-10-CM | POA: Diagnosis not present

## 2024-11-25 DIAGNOSIS — Z7984 Long term (current) use of oral hypoglycemic drugs: Secondary | ICD-10-CM | POA: Diagnosis not present

## 2024-11-25 DIAGNOSIS — F17210 Nicotine dependence, cigarettes, uncomplicated: Secondary | ICD-10-CM | POA: Diagnosis present

## 2024-11-25 DIAGNOSIS — F32A Depression, unspecified: Secondary | ICD-10-CM | POA: Diagnosis present

## 2024-11-25 DIAGNOSIS — F419 Anxiety disorder, unspecified: Secondary | ICD-10-CM | POA: Diagnosis present

## 2024-11-25 DIAGNOSIS — K449 Diaphragmatic hernia without obstruction or gangrene: Secondary | ICD-10-CM | POA: Diagnosis not present

## 2024-11-25 DIAGNOSIS — Z634 Disappearance and death of family member: Secondary | ICD-10-CM | POA: Diagnosis not present

## 2024-11-25 DIAGNOSIS — E119 Type 2 diabetes mellitus without complications: Secondary | ICD-10-CM | POA: Diagnosis present

## 2024-11-25 DIAGNOSIS — Z8042 Family history of malignant neoplasm of prostate: Secondary | ICD-10-CM | POA: Diagnosis not present

## 2024-11-25 DIAGNOSIS — Z801 Family history of malignant neoplasm of trachea, bronchus and lung: Secondary | ICD-10-CM | POA: Diagnosis not present

## 2024-11-25 DIAGNOSIS — E8721 Acute metabolic acidosis: Secondary | ICD-10-CM | POA: Diagnosis present

## 2024-11-25 DIAGNOSIS — Z923 Personal history of irradiation: Secondary | ICD-10-CM | POA: Diagnosis not present

## 2024-11-25 DIAGNOSIS — Z79899 Other long term (current) drug therapy: Secondary | ICD-10-CM | POA: Diagnosis not present

## 2024-11-25 DIAGNOSIS — K859 Acute pancreatitis without necrosis or infection, unspecified: Secondary | ICD-10-CM | POA: Diagnosis present

## 2024-11-25 DIAGNOSIS — Z9012 Acquired absence of left breast and nipple: Secondary | ICD-10-CM | POA: Diagnosis not present

## 2024-11-25 DIAGNOSIS — Z9221 Personal history of antineoplastic chemotherapy: Secondary | ICD-10-CM | POA: Diagnosis not present

## 2024-11-25 DIAGNOSIS — M109 Gout, unspecified: Secondary | ICD-10-CM | POA: Diagnosis present

## 2024-11-25 DIAGNOSIS — K852 Alcohol induced acute pancreatitis without necrosis or infection: Secondary | ICD-10-CM | POA: Diagnosis present

## 2024-11-25 DIAGNOSIS — K2980 Duodenitis without bleeding: Secondary | ICD-10-CM | POA: Diagnosis present

## 2024-11-25 LAB — COMPREHENSIVE METABOLIC PANEL WITH GFR
ALT: 55 U/L — ABNORMAL HIGH (ref 0–44)
AST: 71 U/L — ABNORMAL HIGH (ref 15–41)
Albumin: 2.4 g/dL — ABNORMAL LOW (ref 3.5–5.0)
Alkaline Phosphatase: 163 U/L — ABNORMAL HIGH (ref 38–126)
Anion gap: 9 (ref 5–15)
BUN: 6 mg/dL (ref 6–20)
CO2: 19 mmol/L — ABNORMAL LOW (ref 22–32)
Calcium: 8 mg/dL — ABNORMAL LOW (ref 8.9–10.3)
Chloride: 107 mmol/L (ref 98–111)
Creatinine, Ser: 0.88 mg/dL (ref 0.44–1.00)
GFR, Estimated: >60 mL/min (ref >60)
Glucose, Bld: 120 mg/dL — ABNORMAL HIGH (ref 70–99)
Potassium: 3 mmol/L — ABNORMAL LOW (ref 3.5–5.1)
Sodium: 135 mmol/L (ref 135–145)
Total Bilirubin: 1.1 mg/dL (ref 0.0–1.2)
Total Protein: 5.8 g/dL — ABNORMAL LOW (ref 6.5–8.1)

## 2024-11-25 LAB — CBC WITH DIFFERENTIAL/PLATELET
Abs Immature Granulocytes: 0.15 K/uL — ABNORMAL HIGH (ref 0.00–0.07)
Basophils Absolute: 0 K/uL (ref 0.0–0.1)
Basophils Relative: 0 %
Eosinophils Absolute: 0.1 K/uL (ref 0.0–0.5)
Eosinophils Relative: 1 %
HCT: 31.8 % — ABNORMAL LOW (ref 36.0–46.0)
Hemoglobin: 10.8 g/dL — ABNORMAL LOW (ref 12.0–15.0)
Immature Granulocytes: 1 %
Lymphocytes Relative: 13 %
Lymphs Abs: 1.7 K/uL (ref 0.7–4.0)
MCH: 31 pg (ref 26.0–34.0)
MCHC: 34 g/dL (ref 30.0–36.0)
MCV: 91.4 fL (ref 80.0–100.0)
Monocytes Absolute: 1 K/uL (ref 0.1–1.0)
Monocytes Relative: 7 %
Neutro Abs: 10.4 K/uL — ABNORMAL HIGH (ref 1.7–7.7)
Neutrophils Relative %: 78 %
Platelets: 184 K/uL (ref 150–400)
RBC: 3.48 MIL/uL — ABNORMAL LOW (ref 3.87–5.11)
RDW: 14.3 % (ref 11.5–15.5)
WBC: 13.4 K/uL — ABNORMAL HIGH (ref 4.0–10.5)
nRBC: 0 % (ref 0.0–0.2)

## 2024-11-25 LAB — TRIGLYCERIDES: Triglycerides: 74 mg/dL (ref <150)

## 2024-11-25 LAB — MAGNESIUM: Magnesium: 1.4 mg/dL — ABNORMAL LOW (ref 1.7–2.4)

## 2024-11-25 LAB — LIPASE, BLOOD: Lipase: 441 U/L — ABNORMAL HIGH (ref 11–51)

## 2024-11-25 LAB — HIV ANTIBODY (ROUTINE TESTING W REFLEX): HIV Screen 4th Generation wRfx: NONREACTIVE

## 2024-11-25 MED ORDER — ENOXAPARIN SODIUM 40 MG/0.4ML IJ SOSY
40.0000 mg | PREFILLED_SYRINGE | Freq: Every day | INTRAMUSCULAR | Status: DC
  Administered 2024-11-25 – 2024-11-28 (×4): 40 mg via SUBCUTANEOUS
  Filled 2024-11-25 (×4): qty 0.4

## 2024-11-25 MED ORDER — IRBESARTAN 150 MG PO TABS
150.0000 mg | ORAL_TABLET | Freq: Every day | ORAL | Status: DC
  Administered 2024-11-25 – 2024-11-26 (×2): 150 mg via ORAL
  Filled 2024-11-25 (×2): qty 1

## 2024-11-25 MED ORDER — ATORVASTATIN CALCIUM 10 MG PO TABS
20.0000 mg | ORAL_TABLET | Freq: Every day | ORAL | Status: DC
  Administered 2024-11-25 – 2024-11-30 (×6): 20 mg via ORAL
  Filled 2024-11-25 (×6): qty 2

## 2024-11-25 MED ORDER — IRBESARTAN-HYDROCHLOROTHIAZIDE 150-12.5 MG PO TABS: 1.0000 | ORAL_TABLET | Freq: Every day | ORAL | Status: DC

## 2024-11-25 MED ORDER — HYDROMORPHONE HCL 1 MG/ML IJ SOLN
1.0000 mg | Freq: Once | INTRAMUSCULAR | Status: AC
  Administered 2024-11-25: 1 mg via INTRAVENOUS
  Filled 2024-11-25: qty 1

## 2024-11-25 MED ORDER — HYDROCHLOROTHIAZIDE 12.5 MG PO TABS
12.5000 mg | ORAL_TABLET | Freq: Every day | ORAL | Status: DC
  Administered 2024-11-25 – 2024-11-26 (×2): 12.5 mg via ORAL
  Filled 2024-11-25 (×2): qty 1

## 2024-11-25 MED ORDER — LACTATED RINGERS IV SOLN: INTRAVENOUS | Status: AC

## 2024-11-25 MED ORDER — HYDROMORPHONE HCL 1 MG/ML IJ SOLN
1.0000 mg | INTRAMUSCULAR | Status: DC | PRN
  Administered 2024-11-25 – 2024-11-27 (×10): 1 mg via INTRAVENOUS
  Filled 2024-11-25 (×10): qty 1

## 2024-11-25 MED ORDER — POTASSIUM CHLORIDE CRYS ER 20 MEQ PO TBCR
40.0000 meq | EXTENDED_RELEASE_TABLET | Freq: Three times a day (TID) | ORAL | Status: AC
  Administered 2024-11-25 – 2024-11-26 (×3): 40 meq via ORAL
  Filled 2024-11-25 (×3): qty 2

## 2024-11-25 NOTE — H&P (Addendum)
 History and Physical    Patient: Hannah Hale FMW:[id number] DOB: June 21, 1966 DOA: 11/24/2024 DOS: the patient was seen and examined on 11/25/2024 PCP: Sherre Geni LABOR, NP  Patient coming from: Home  Chief Complaint:  Chief Complaint  Patient presents with   Abdominal Pain   HPI: Hannah Hale is a 58 y.o. female with medical history significant of alcohol abuse, L breast cancer s/p L mastectomy w/ sentinel lymph node biopsy in 2013 followed by chemotherapy/radiation, HTN, DM2, prior LLE DVT, fibroids, and GERD who p/w acute pancreatitis c/b alcohol withdrawal.  The patient reported experiencing persistent abdominal pain for the past three days. Her sx began on 12/10 when she presented to the ED, and her w/u at the time was unyielding; so she returned home. Upon returning home, the following morning her sx reappeared after consuming a slice of toast and an egg, which did not stay down. The patient also attempted to eat soup and drink Coca Cola that afternoon, but those did not stay down either. The following day, the patient drank ginger ale and soups, but these too were vomited. Given her recurrent emesis and inability to tol PO, pt returned to the ED for evaluation.  In the ED, pt hypertensive and tachycadic. Labs notable for K 2.9, lipase 2955, and WBC 13. CTA w/ contrast c/w acute pancreatitis. EDP started CIWA and requested medicine admission.    Review of Systems: As mentioned in the history of present illness. All other systems reviewed and are negative. Past Medical History:  Diagnosis Date   Breast cancer (HCC)    left, Sept 20 2013   Clotting disorder 2013   L calf   Coughing    cold recent    Diabetes mellitus without complication (HCC)    on meds   Fibroids    uterine   GERD (gastroesophageal reflux disease)    with certain foods   Hypertension    on meds   Personal history of chemotherapy    2013/2014   Personal history of radiation therapy    2014    Past Surgical History:  Procedure Laterality Date   AXILLARY LYMPH NODE DISSECTION  12/08/2012   Procedure: AXILLARY LYMPH NODE DISSECTION;  Surgeon: Alm VEAR Angle, MD;  Location: [facility];  Service: General;  Laterality: Left;   MASTECTOMY Left    PORTACATH PLACEMENT  12/08/2012   Procedure: INSERTION PORT-A-CATH;  Surgeon: Alm VEAR Angle, MD;  Location: [facility];  Service: General;  Laterality: N/A;  Power Port Placment and Left Axillary Node Dissection   SIMPLE MASTECTOMY W/ SENTINEL NODE BIOPSY  11/03/2012   SIMPLE MASTECTOMY WITH AXILLARY SENTINEL NODE BIOPSY  11/03/2012   Procedure: SIMPLE MASTECTOMY WITH AXILLARY SENTINEL NODE BIOPSY;  Surgeon: Alm VEAR Angle, MD;  Location: [facility] OR;  Service: General;  Laterality: Left;   WISDOM TOOTH EXTRACTION     Social History:  reports that she has been smoking cigarettes. She has a 7.3 pack-year smoking history. She has never used smokeless tobacco. She reports current alcohol use of about 15.0 standard drinks of alcohol per week. She reports that she does not use drugs.  Allergies[1]  Family History  Problem Relation Age of Onset   Lung cancer Maternal Grandfather    Diabetes Mother    Prostate cancer Father 67   Colon polyps Father 8   Stroke Maternal Grandmother    Lung cancer Cousin        non smoker, died in his 61s; paternal cousin  Lung cancer Cousin        father's maternal cousin; smoker   Stroke Paternal Grandfather    Colon cancer Neg Hx    Esophageal cancer Neg Hx    Stomach cancer Neg Hx    Rectal cancer Neg Hx     Prior to Admission medications  Medication Sig Start Date End Date Taking? Authorizing Provider  atorvastatin  (LIPITOR) 20 MG tablet atorvastatin  20 mg tablet  TAKE 1 TABLET BY MOUTH EVERY DAY    [provider]  Carvedilol (COREG PO) Take by mouth daily.    [provider]  irbesartan -hydrochlorothiazide  (AVALIDE) 150-12.5 MG tablet Take 1 tablet by mouth daily. 11/15/18   Odean Potts,  MD  metFORMIN  (GLUCOPHAGE ) 1000 MG tablet Take 1 tablet (1,000 mg total) by mouth 2 (two) times daily with a meal. Patient taking differently: Take 500 mg by mouth 2 (two) times daily with a meal. 11/15/18   Odean Potts, MD  sulfamethoxazole -trimethoprim  (BACTRIM  DS) 800-160 MG tablet Take 1 tablet by mouth daily. 11/28/19   Odean Potts, MD    Physical Exam: Vitals:   11/25/24 0445 11/25/24 0530 11/25/24 0600 11/25/24 0617  BP: 135/88 122/86    Pulse: 98 (!) 101 (!) 130   Resp:  16    Temp:  97.9 F (36.6 C)    TempSrc:  Oral    SpO2: 97% 96%    Weight:    70.1 kg  Height:    5' 4 (1.626 m)   General: Alert, oriented x3, resting comfortably in no acute distress Respiratory: Lungs clear to auscultation bilaterally with normal respiratory effort; no w/r/r Cardiovascular: Regular rate and rhythm w/o m/r/g Abdomen: Soft, and nondistended. TTP in epiasgtric region. Positive bowel sounds.   Data Reviewed:  Lab Results  Component Value Date   WBC 13.0 (H) 11/24/2024   HGB 12.9 11/24/2024   HCT 38.1 11/24/2024   MCV 92.5 11/24/2024   PLT 219.0 11/24/2024   Lab Results  Component Value Date   GLUCOSE 117 (H) 11/24/2024   CALCIUM  8.1 (L) 11/24/2024   NA 136 11/24/2024   K 2.9 (L) 11/24/2024   CO2 19 (L) 11/24/2024   CL 103 11/24/2024   BUN 9 11/24/2024   CREATININE 1.08 (H) 11/24/2024   Lab Results  Component Value Date   ALT 84 (H) 11/24/2024   AST 116 (H) 11/24/2024   ALKPHOS 204 (H) 11/24/2024   BILITOT 1.4 (H) 11/24/2024   Lab Results  Component Value Date   INR 2.50 11/13/2013   INR 2.00 10/20/2013   INR 3.40 09/20/2013   PROTIME 30.0 (H) 11/13/2013   PROTIME 24.0 (H) 10/20/2013   PROTIME 40.8 (H) 09/20/2013   Radiology: CT ABDOMEN PELVIS W CONTRAST Result Date: 11/24/2024 EXAM: CT ABDOMEN AND PELVIS WITH CONTRAST 11/22/2024 TECHNIQUE: CT of the abdomen and pelvis was performed with the administration of 100 mL of iohexol  (OMNIPAQUE ) 300 MG/ML solution.  Multiplanar reformatted images are provided for review. Automated exposure control, iterative reconstruction, and/or weight-based adjustment of the mA/kV was utilized to reduce the radiation dose to as low as reasonably achievable. COMPARISON: None available. CLINICAL HISTORY: Pancreatitis, acute, severe FINDINGS: LOWER CHEST: No acute abnormality. LIVER: There is a 2.8 x 1.9 cm hypodense area with some peripheral enhancing puddling on delayed imaging favoring hemangioma. This is unchanged from prior. There is diffuse fatty infiltration of the liver, unchanged. GALLBLADDER AND BILE DUCTS: Gallbladder and bile ducts are within normal limits. SPLEEN: No acute abnormality. PANCREAS: There is  mild diffuse inflammatory stranding surrounding the entire pancreas. No pancreatic ductal dilatation or enhancing fluid collection. Pancreas enhances normally. ADRENAL GLANDS: No acute abnormality. KIDNEYS, URETERS AND BLADDER: There is a 7 mm calculus in the inferior pole of the left kidney. The kidneys are otherwise within normal limits. No hydronephrosis. No perinephric or periureteral stranding. Urinary bladder is unremarkable. GI AND BOWEL: There is a moderate sized hiatal hernia containing the proximal stomach. The appendix appears normal. There is no bowel obstruction. PERITONEUM AND RETROPERITONEUM: There is a small amount of free fluid in the pelvis and bilateral upper quadrants. No free air. VASCULATURE: Aorta is normal in caliber. There are atherosclerotic calcifications of the aorta. LYMPH NODES: No lymphadenopathy. REPRODUCTIVE ORGANS: The uterus is enlarged and lobulated containing numerous fibroids measuring up to 5.9 cm. Ovaries are not well delineated on this study. BONES AND SOFT TISSUES: No acute osseous abnormality. No focal soft tissue abnormality. IMPRESSION: 1. Mild diffuse inflammatory stranding surrounding the pancreas, consistent with acute pancreatitis. No pancreatic ductal dilatation or enhancing fluid  collection. Normal pancreatic enhancement. 2. Small amount of free fluid in the abdomen and pelvis. 3. Moderate-sized hiatal hernia containing the proximal stomach. 4. Enlarged, lobulated uterus with multiple fibroids measuring up to 5.9 cm. Ovaries are not well delineated on this study. Electronically signed by: Greig Pique MD 11/24/2024 07:04 PM EST RP Workstation: [id number]    Assessment and Plan: 89F h/o alcohol abuse, L breast cancer s/p L mastectomy w/ sentinel lymph node biopsy in 2013 followed by chemotherapy/radiation, HTN, DM2, prior LLE DVT, fibroids, and GERD who p/w acute pancreatitis c/b alcohol withdrawal.  Acute pancreatitis -MIVF: LR at 150cc/h for now -IV dilaudid  1mg  q4h prn -Full liquid diet for now  Alcohol withdrawal Alcohol abuse -IV/PO ativan  w/ thiamine /folate per CIWA protocol  Hypokalemia -Kdur 40mEq TID x 3 doses -F/u Mg and replete prn  HTN -PTA irbesartan -hydrochlorothiazide  150-12.5mg  daily  Mood disorder Pt PHQ-2 positive and with reported increased stress/anxiety since the death of her husband this past June -Consider SSRI prior to d/c   Advance Care Planning:   Code Status: Full Code   Consults: N/A  Family Communication: Cousin  Severity of Illness: The appropriate patient status for this patient is INPATIENT. Inpatient status is judged to be reasonable and necessary in order to provide the required intensity of service to ensure the patient's safety. The patient's presenting symptoms, physical exam findings, and initial radiographic and laboratory data in the context of their chronic comorbidities is felt to place them at high risk for further clinical deterioration. Furthermore, it is not anticipated that the patient will be medically stable for discharge from the hospital within 2 midnights of admission.   * I certify that at the point of admission it is my clinical judgment that the patient will require inpatient hospital care spanning  beyond 2 midnights from the point of admission due to high intensity of service, high risk for further deterioration and high frequency of surveillance required.*   ------- I spent 55 minutes reviewing previous notes, at the bedside counseling/discussing the treatment plan, and performing clinical documentation.  Author: Marsha Ada, MD 11/25/2024 7:53 AM  For on call review [email or web address].      [1] No Known Allergies

## 2024-11-25 NOTE — Consult Note (Signed)
 Consultation Note   Referring Provider:    [facility] PCP: Sherre Geni LABOR, NP Primary Gastroenterologist:  Elspeth Naval, MD       Reason for Consultation:  Pancreatitis DOA: 11/24/2024         Hospital Day: 2   ASSESSMENT     58 year old female with  Etoh use disorder Acute mild pancreatitis concurrent elevated LFTs Etiology of pancreatitis unclear but possibly EtOH .  No gallstones on ultrasound or CT scan to suggest biliary pancreatitis. She does not take any medications ( prescriptions or OTC).  No vitamins/supplements.  Serum calcium  is not elevated.  No obvious pancreatic masses on imaging.  Less common etiologies of pancreatitis such as autoimmune, viral, etc not suspected at this point.   Chronically elevated alk phos  Hiatal hernia  CT scan demonstrating a moderate sized hiatal hernia containing the proximal stomach.  Denies GERD symptoms but could be contributing to the chronic postprandial nausea and vomiting  Chronic intermittent postprandial nausea / vomiting  Will need further workup but possibly related to hiatal hernia containing proximal stomach  Chronic postprandial loose stool Could have IBS  Uterine fibroids  See PMH for any additional medical history  / medical problems  Principal Problem:   Acute alcoholic pancreatitis Active Problems:   Acute pancreatitis   PLAN:   --Will check her triglycerides,  but unlikely cause --Will check AMA  given chronic intermittent elevation of Alk phos --Will check hepatitis panel --Continue LR at 150/ hr. Monitor volume status. HCT 31%. .  --Continue B1 --Analgesics, antiemetics as needed --Potassium repletion in progress --Continue full liquid diet as tolerated for now --CIWA protocol in place --Discussed with patient the possible role of EtOH as cause of pancreatitis.  -- Eventual outpatient workup of chronic postprandial intermittent nausea,  vomiting, abdominal pain and diarrhea.  Given CT findings of hiatal hernia containing proximal stomach and chronic upper GI symptoms it isn't unreasonable to put her on an acid reducer such as famotidine    HPI   Brief History:  Hannah Hale gives a longstanding history of chronic intermittent postprandial upper abdominal pain, nausea and vomiting.  Symptoms have been going on for years but they have been manageable.  However, over the last several days she has been having pain more localized to the epigastrium .  The pain has been worse than her usual pain as has the nausea and vomiting .  She went to the ED on 11/22/2024 for evaluation.  There her transaminases were elevated (AST 330s /ALT 170s), alk phos was 297 but her bilirubin was normal.  She also had a mild elevation in her lipase, less than 3 ULN.  Ultrasound was negative for gallstones or biliary duct dilation.  CT scan with contrast without any acute findings.  Case was discussed with the EDP and arrangements made for patient to be discharged from the ED with follow-up labs within a couple of days at our office.   Interval History:  Patient actually returned to the ED yesterday with ongoing pain and inability to tolerate p.o.  In the ED she was hypertensive and tachycardic.  Labs remarkable for lipase of 2955 .  WBC of 12, hematocrit 38, potassium 2.9, creatinine up slightly to 1.8 (from  0.97).  Compared to labs 2 days prior her transaminases had improved.  Alk phos was down from 297-204.  T. bili was up to 1.4 from 0.6.    She was given a liter LR bolus and started on maintenance fluids Today white count remains around 13, hematocrit down to 31.8.   Hannah Hale tried to eat oatmeal this morning but subsequently vomited.    Pertinent GI Studies   October 2021 screening colonoscopy -Dr. Leigh -- Hemorrhoids on perianal exam --Internal hemorrhoids, large -- Exam otherwise without abnormality  Labs and Imaging:  Recent Labs     11/24/24 1220  PROT 7.0  ALBUMIN 3.4*  AST 116*  ALT 84*  ALKPHOS 204*  BILITOT 1.4*  BILIDIR 0.5*   Recent Labs    11/24/24 1220  WBC 13.0*  HGB 12.9  HCT 38.1  MCV 92.5  PLT 219.0   Recent Labs    11/24/24 1645  NA 136  K 2.9*  CL 103  CO2 19*  GLUCOSE 117*  BUN 9  CREATININE 1.08*  CALCIUM  8.1*     CT ABDOMEN PELVIS W CONTRAST EXAM: CT ABDOMEN AND PELVIS WITH CONTRAST 11/22/2024  TECHNIQUE: CT of the abdomen and pelvis was performed with the administration of 100 mL of iohexol  (OMNIPAQUE ) 300 MG/ML solution. Multiplanar reformatted images are provided for review. Automated exposure control, iterative reconstruction, and/or weight-based adjustment of the mA/kV was utilized to reduce the radiation dose to as low as reasonably achievable.  COMPARISON: None available.  CLINICAL HISTORY: Pancreatitis, acute, severe  FINDINGS:  LOWER CHEST: No acute abnormality.  LIVER: There is a 2.8 x 1.9 cm hypodense area with some peripheral enhancing puddling on delayed imaging favoring hemangioma. This is unchanged from prior. There is diffuse fatty infiltration of the liver, unchanged.  GALLBLADDER AND BILE DUCTS: Gallbladder and bile ducts are within normal limits.  SPLEEN: No acute abnormality.  PANCREAS: There is mild diffuse inflammatory stranding surrounding the entire pancreas. No pancreatic ductal dilatation or enhancing fluid collection. Pancreas enhances normally.  ADRENAL GLANDS: No acute abnormality.  KIDNEYS, URETERS AND BLADDER: There is a 7 mm calculus in the inferior pole of the left kidney. The kidneys are otherwise within normal limits. No hydronephrosis. No perinephric or periureteral stranding. Urinary bladder is unremarkable.  GI AND BOWEL: There is a moderate sized hiatal hernia containing the proximal stomach. The appendix appears normal. There is no bowel obstruction.  PERITONEUM AND RETROPERITONEUM: There is a small  amount of free fluid in the pelvis and bilateral upper quadrants. No free air.  VASCULATURE: Aorta is normal in caliber. There are atherosclerotic calcifications of the aorta.  LYMPH NODES: No lymphadenopathy.  REPRODUCTIVE ORGANS: The uterus is enlarged and lobulated containing numerous fibroids measuring up to 5.9 cm. Ovaries are not well delineated on this study.  BONES AND SOFT TISSUES: No acute osseous abnormality. No focal soft tissue abnormality.  IMPRESSION: 1. Mild diffuse inflammatory stranding surrounding the pancreas, consistent with acute pancreatitis. No pancreatic ductal dilatation or enhancing fluid collection. Normal pancreatic enhancement. 2. Small amount of free fluid in the abdomen and pelvis. 3. Moderate-sized hiatal hernia containing the proximal stomach. 4. Enlarged, lobulated uterus with multiple fibroids measuring up to 5.9 cm. Ovaries are not well delineated on this study.  Electronically signed by: Greig Pique MD 11/24/2024 07:04 PM EST RP Workstation: [id number]     Past Medical History:  Diagnosis Date   Breast cancer (HCC)    left, Sept 20 2013   Clotting disorder 2013  L calf   Coughing    cold recent    Diabetes mellitus without complication (HCC)    on meds   Fibroids    uterine   GERD (gastroesophageal reflux disease)    with certain foods   Hypertension    on meds   Personal history of chemotherapy    2013/2014   Personal history of radiation therapy    2014    Past Surgical History:  Procedure Laterality Date   AXILLARY LYMPH NODE DISSECTION  12/08/2012   Procedure: AXILLARY LYMPH NODE DISSECTION;  Surgeon: Alm VEAR Angle, MD;  Location: [facility];  Service: General;  Laterality: Left;   MASTECTOMY Left    PORTACATH PLACEMENT  12/08/2012   Procedure: INSERTION PORT-A-CATH;  Surgeon: Alm VEAR Angle, MD;  Location: [facility];  Service: General;  Laterality: N/A;  Power Port Placment and Left Axillary Node Dissection   SIMPLE  MASTECTOMY W/ SENTINEL NODE BIOPSY  11/03/2012   SIMPLE MASTECTOMY WITH AXILLARY SENTINEL NODE BIOPSY  11/03/2012   Procedure: SIMPLE MASTECTOMY WITH AXILLARY SENTINEL NODE BIOPSY;  Surgeon: Alm VEAR Angle, MD;  Location: [facility];  Service: General;  Laterality: Left;   WISDOM TOOTH EXTRACTION      Family History  Problem Relation Age of Onset   Lung cancer Maternal Grandfather    Diabetes Mother    Prostate cancer Father 33   Colon polyps Father 84   Stroke Maternal Grandmother    Lung cancer Cousin        non smoker, died in his 33s; paternal cousin   Lung cancer Cousin        father's maternal cousin; smoker   Stroke Paternal Grandfather    Colon cancer Neg Hx    Esophageal cancer Neg Hx    Stomach cancer Neg Hx    Rectal cancer Neg Hx     Prior to Admission medications  Medication Sig Start Date End Date Taking? Authorizing Provider  atorvastatin  (LIPITOR) 20 MG tablet atorvastatin  20 mg tablet  TAKE 1 TABLET BY MOUTH EVERY DAY    [provider]  Carvedilol (COREG PO) Take by mouth daily.    [provider]  irbesartan -hydrochlorothiazide  (AVALIDE) 150-12.5 MG tablet Take 1 tablet by mouth daily. 11/15/18   Gudena, Vinay, MD  metFORMIN  (GLUCOPHAGE ) 1000 MG tablet Take 1 tablet (1,000 mg total) by mouth 2 (two) times daily with a meal. Patient taking differently: Take 500 mg by mouth 2 (two) times daily with a meal. 11/15/18   Odean Potts, MD  sulfamethoxazole -trimethoprim  (BACTRIM  DS) 800-160 MG tablet Take 1 tablet by mouth daily. 11/28/19   Gudena, Vinay, MD    Current Facility-Administered Medications  Medication Dose Route Frequency Provider Last Rate Last Admin   atorvastatin  (LIPITOR) tablet 20 mg  20 mg Oral Daily Georgina Basket, MD   20 mg at 11/25/24 9170   enoxaparin  (LOVENOX ) injection 40 mg  40 mg Subcutaneous Daily Georgina Basket, MD   40 mg at 11/25/24 0830   irbesartan  (AVAPRO ) tablet 150 mg  150 mg Oral Daily Georgina Basket, MD   150 mg at  11/25/24 9170   And   hydrochlorothiazide  (HYDRODIURIL ) tablet 12.5 mg  12.5 mg Oral Daily Georgina Basket, MD   12.5 mg at 11/25/24 9170   HYDROmorphone  (DILAUDID ) injection 1 mg  1 mg Intravenous Q4H PRN Moore, Willie, MD   1 mg at 11/25/24 0830   lactated ringers  infusion   Intravenous Continuous Georgina Basket, MD 150 mL/hr at 11/25/24  9160 New Bag at 11/25/24 0839   LORazepam  (ATIVAN ) injection 0-4 mg  0-4 mg Intravenous Q6H Curatolo, Adam, DO       Or   LORazepam  (ATIVAN ) tablet 0-4 mg  0-4 mg Oral Q6H Curatolo, Adam, DO   1 mg at 11/25/24 0415   [START ON 11/27/2024] LORazepam  (ATIVAN ) injection 0-4 mg  0-4 mg Intravenous Q12H Curatolo, Adam, DO       Or   [START ON 11/27/2024] LORazepam  (ATIVAN ) tablet 0-4 mg  0-4 mg Oral Q12H Curatolo, Adam, DO       thiamine  (VITAMIN B1) tablet 100 mg  100 mg Oral Daily Curatolo, Adam, DO   100 mg at 11/25/24 9170   Or   thiamine  (VITAMIN B1) injection 100 mg  100 mg Intravenous Daily Curatolo, Adam, DO   100 mg at 11/24/24 1746    Allergies as of 11/24/2024   (No Known Allergies)    Social History   Socioeconomic History   Marital status: Married    Spouse name: Not on file   Number of children: 0   Years of education: Not on file   Highest education level: Not on file  Occupational History   Not on file  Tobacco Use   Smoking status: Every Day    Current packs/day: 0.25    Average packs/day: 0.3 packs/day for 29.0 years (7.3 ttl pk-yrs)    Types: Cigarettes   Smokeless tobacco: Never  Vaping Use   Vaping status: Never Used  Substance and Sexual Activity   Alcohol use: Yes    Alcohol/week: 15.0 standard drinks of alcohol    Types: 12 Cans of beer, 3 Glasses of wine per week    Comment: 6 pack/week   Drug use: No   Sexual activity: Yes  Other Topics Concern   Not on file  Social History Narrative   Not on file   Social Drivers of Health   Tobacco Use: High Risk (11/22/2024)   Patient History    Smoking Tobacco Use: Every  Day    Smokeless Tobacco Use: Never    Passive Exposure: Not on file  Financial Resource Strain: Not on file  Food Insecurity: No Food Insecurity (11/25/2024)   Epic    Worried About Programme Researcher, Broadcasting/film/video in the Last Year: Never true    Ran Out of Food in the Last Year: Never true  Transportation Needs: No Transportation Needs (11/25/2024)   Epic    Lack of Transportation (Medical): No    Lack of Transportation (Non-Medical): No  Physical Activity: Not on file  Stress: Not on file  Social Connections: Unknown (04/27/2022)   Received from [facility]   Social Network    Social Network: Not on file  Intimate Partner Violence: Not At Risk (11/25/2024)   Epic    Fear of Current or Ex-Partner: No    Emotionally Abused: No    Physically Abused: No    Sexually Abused: No  Depression (PHQ2-9): Low Risk (04/24/2022)   Depression (PHQ2-9)    PHQ-2 Score: 0  Alcohol Screen: Not on file  Housing: Unknown (11/25/2024)   Epic    Unable to Pay for Housing in the Last Year: No    Number of Times Moved in the Last Year: 0    Homeless in the Last Year: Not on file  Utilities: Not At Risk (11/25/2024)   Epic    Threatened with loss of utilities: No  Health Literacy: Not on file  Code Status   Code Status: Full Code  Review of Systems: All systems reviewed and negative except where noted in HPI.  Physical Exam: Vital signs in last 24 hours: Temp:  [97.5 F (36.4 C)-99.1 F (37.3 C)] 97.9 F (36.6 C) (12/13 0530) Pulse Rate:  [82-130] 130 (12/13 0600) Resp:  [16] 16 (12/13 0530) BP: (122-170)/(77-103) 122/86 (12/13 0530) SpO2:  [96 %-100 %] 96 % (12/13 0530) Weight:  [68 kg-70.1 kg] 70.1 kg (12/13 0617) Last BM Date : 11/24/24  General:  Pleasant female in NAD Psych:  Cooperative. Normal mood and affect Eyes: Pupils equal Ears:  Normal auditory acuity Nose: No deformity, discharge or lesions Neck:  Supple, no masses felt Lungs:  Clear to auscultation.  Heart:  Regular  rate, regular rhythm.  Abdomen:  Soft, nondistended, nontender, active bowel sounds, no masses felt Rectal :  Deferred Msk: Symmetrical without gross deformities.  Neurologic:  Alert, oriented, grossly normal neurologically Extremities : No edema Skin:  Intact without significant lesions.    Intake/Output from previous day: No intake/output data recorded. Intake/Output this shift:  No intake/output data recorded.   Vina Dasen, NP-C   11/25/2024, 10:06 AM

## 2024-11-25 NOTE — Plan of Care (Signed)
  Problem: Education: Goal: Knowledge of General Education information will improve Description: Including pain rating scale, medication(s)/side effects and non-pharmacologic comfort measures Outcome: Progressing   Problem: Clinical Measurements: Goal: Ability to maintain clinical measurements within normal limits will improve Outcome: Progressing   Problem: Activity: Goal: Risk for activity intolerance will decrease Outcome: Progressing   Problem: Nutrition: Goal: Adequate nutrition will be maintained Outcome: Progressing   Problem: Coping: Goal: Level of anxiety will decrease Outcome: Progressing   Problem: Pain Managment: Goal: General experience of comfort will improve and/or be controlled Outcome: Progressing

## 2024-11-26 DIAGNOSIS — K852 Alcohol induced acute pancreatitis without necrosis or infection: Principal | ICD-10-CM | POA: Diagnosis present

## 2024-11-26 LAB — HEPATITIS PANEL, ACUTE
HCV Ab: NONREACTIVE
Hep A IgM: NONREACTIVE
Hep B C IgM: NONREACTIVE
Hepatitis B Surface Ag: NONREACTIVE

## 2024-11-26 LAB — BASIC METABOLIC PANEL WITH GFR
Anion gap: 9 (ref 5–15)
BUN: 5 mg/dL — ABNORMAL LOW (ref 6–20)
CO2: 21 mmol/L — ABNORMAL LOW (ref 22–32)
Calcium: 8.8 mg/dL — ABNORMAL LOW (ref 8.9–10.3)
Chloride: 105 mmol/L (ref 98–111)
Creatinine, Ser: 0.8 mg/dL (ref 0.44–1.00)
GFR, Estimated: >60 mL/min (ref >60)
Glucose, Bld: 121 mg/dL — ABNORMAL HIGH (ref 70–99)
Potassium: 3.8 mmol/L (ref 3.5–5.1)
Sodium: 135 mmol/L (ref 135–145)

## 2024-11-26 LAB — CBC
HCT: 32.4 % — ABNORMAL LOW (ref 36.0–46.0)
Hemoglobin: 11 g/dL — ABNORMAL LOW (ref 12.0–15.0)
MCH: 31.3 pg (ref 26.0–34.0)
MCHC: 34 g/dL (ref 30.0–36.0)
MCV: 92 fL (ref 80.0–100.0)
Platelets: 205 K/uL (ref 150–400)
RBC: 3.52 MIL/uL — ABNORMAL LOW (ref 3.87–5.11)
RDW: 14.6 % (ref 11.5–15.5)
WBC: 10.6 K/uL — ABNORMAL HIGH (ref 4.0–10.5)
nRBC: 0 % (ref 0.0–0.2)

## 2024-11-26 LAB — MITOCHONDRIAL ANTIBODIES: Mitochondrial M2 Ab, IgG: <20 U (ref 0.0–20.0)

## 2024-11-26 MED ORDER — TRAMADOL HCL 50 MG PO TABS
50.0000 mg | ORAL_TABLET | Freq: Every day | ORAL | Status: DC | PRN
  Administered 2024-11-26: 50 mg via ORAL
  Filled 2024-11-26: qty 1

## 2024-11-26 MED ORDER — LACTATED RINGERS IV SOLN: INTRAVENOUS | Status: AC

## 2024-11-26 MED ORDER — PANTOPRAZOLE SODIUM 40 MG PO TBEC
40.0000 mg | DELAYED_RELEASE_TABLET | Freq: Every day | ORAL | Status: DC
  Administered 2024-11-26 – 2024-11-28 (×3): 40 mg via ORAL
  Filled 2024-11-26 (×3): qty 1

## 2024-11-26 MED ORDER — POLYETHYLENE GLYCOL 3350 17 G PO PACK
17.0000 g | PACK | Freq: Every day | ORAL | Status: DC
  Administered 2024-11-26 – 2024-11-28 (×2): 17 g via ORAL
  Filled 2024-11-26 (×2): qty 1

## 2024-11-26 MED ORDER — SENNA 8.6 MG PO TABS
1.0000 | ORAL_TABLET | Freq: Every day | ORAL | Status: DC
  Administered 2024-11-26 – 2024-11-28 (×3): 8.6 mg via ORAL
  Filled 2024-11-26 (×3): qty 1

## 2024-11-26 MED ORDER — BISACODYL 10 MG RE SUPP: 10.0000 mg | Freq: Every day | RECTAL | Status: DC | PRN

## 2024-11-26 MED ORDER — ALUM & MAG HYDROXIDE-SIMETH 200-200-20 MG/5ML PO SUSP
15.0000 mL | Freq: Four times a day (QID) | ORAL | Status: DC | PRN
  Administered 2024-11-26: 15 mL via ORAL
  Filled 2024-11-26: qty 30

## 2024-11-26 MED ORDER — MAGNESIUM SULFATE 2 GM/50ML IV SOLN
2.0000 g | Freq: Once | INTRAVENOUS | Status: AC
  Administered 2024-11-26: 2 g via INTRAVENOUS
  Filled 2024-11-26: qty 50

## 2024-11-26 NOTE — Plan of Care (Signed)
  Problem: Education: Goal: Knowledge of General Education information will improve Description: Including pain rating scale, medication(s)/side effects and non-pharmacologic comfort measures Outcome: Progressing   Problem: Activity: Goal: Risk for activity intolerance will decrease Outcome: Progressing   Problem: Nutrition: Goal: Adequate nutrition will be maintained Outcome: Progressing   Problem: Coping: Goal: Level of anxiety will decrease Outcome: Progressing   Problem: Pain Managment: Goal: General experience of comfort will improve and/or be controlled Outcome: Not Progressing

## 2024-11-26 NOTE — Progress Notes (Signed)
 Progress Note   Subjective   Patient seems to be doing a bit better - tolerating full liquids. Is still having some discomfort but not has bad. No vomiting.    Objective   Vital signs in last 24 hours: Temp:  [98.4 F (36.9 C)-99.2 F (37.3 C)] 98.8 F (37.1 C) (12/14 0819) Pulse Rate:  [89-105] 93 (12/14 0819) Resp:  [15-17] 17 (12/14 0819) BP: (135-157)/(82-95) 157/84 (12/14 0819) SpO2:  [98 %-100 %] 100 % (12/14 0819) Last BM Date : 11/24/24 General:    AA female in NAD Neurologic:  Alert and oriented,  grossly normal neurologically. Psych:  Cooperative. Normal mood and affect.  Intake/Output from previous day: No intake/output data recorded. Intake/Output this shift: No intake/output data recorded.  Lab Results: Recent Labs    11/24/24 1220 11/25/24 0957 11/26/24 0906  WBC 13.0* 13.4* 10.6*  HGB 12.9 10.8* 11.0*  HCT 38.1 31.8* 32.4*  PLT 219.0 184 205   BMET Recent Labs    11/24/24 1645 11/25/24 0957 11/26/24 0906  NA 136 135 135  K 2.9* 3.0* 3.8  CL 103 107 105  CO2 19* 19* 21*  GLUCOSE 117* 120* 121*  BUN 9 6 5*  CREATININE 1.08* 0.88 0.80  CALCIUM  8.1* 8.0* 8.8*   LFT Recent Labs    11/24/24 1220 11/25/24 0957  PROT 7.0 5.8*  ALBUMIN 3.4* 2.4*  AST 116* 71*  ALT 84* 55*  ALKPHOS 204* 163*  BILITOT 1.4* 1.1  BILIDIR 0.5*  --    PT/INR No results for input(s): LABPROT, INR in the last 72 hours.  Studies/Results: CT ABDOMEN PELVIS W CONTRAST Result Date: 11/24/2024 EXAM: CT ABDOMEN AND PELVIS WITH CONTRAST 11/22/2024 TECHNIQUE: CT of the abdomen and pelvis was performed with the administration of 100 mL of iohexol  (OMNIPAQUE ) 300 MG/ML solution. Multiplanar reformatted images are provided for review. Automated exposure control, iterative reconstruction, and/or weight-based adjustment of the mA/kV was utilized to reduce the radiation dose to as low as reasonably achievable. COMPARISON: None available. CLINICAL HISTORY:  Pancreatitis, acute, severe FINDINGS: LOWER CHEST: No acute abnormality. LIVER: There is a 2.8 x 1.9 cm hypodense area with some peripheral enhancing puddling on delayed imaging favoring hemangioma. This is unchanged from prior. There is diffuse fatty infiltration of the liver, unchanged. GALLBLADDER AND BILE DUCTS: Gallbladder and bile ducts are within normal limits. SPLEEN: No acute abnormality. PANCREAS: There is mild diffuse inflammatory stranding surrounding the entire pancreas. No pancreatic ductal dilatation or enhancing fluid collection. Pancreas enhances normally. ADRENAL GLANDS: No acute abnormality. KIDNEYS, URETERS AND BLADDER: There is a 7 mm calculus in the inferior pole of the left kidney. The kidneys are otherwise within normal limits. No hydronephrosis. No perinephric or periureteral stranding. Urinary bladder is unremarkable. GI AND BOWEL: There is a moderate sized hiatal hernia containing the proximal stomach. The appendix appears normal. There is no bowel obstruction. PERITONEUM AND RETROPERITONEUM: There is a small amount of free fluid in the pelvis and bilateral upper quadrants. No free air. VASCULATURE: Aorta is normal in caliber. There are atherosclerotic calcifications of the aorta. LYMPH NODES: No lymphadenopathy. REPRODUCTIVE ORGANS: The uterus is enlarged and lobulated containing numerous fibroids measuring up to 5.9 cm. Ovaries are not well delineated on this study. BONES AND SOFT TISSUES: No acute osseous abnormality. No focal soft tissue abnormality. IMPRESSION: 1. Mild diffuse inflammatory stranding surrounding the pancreas, consistent with acute pancreatitis. No pancreatic ductal dilatation or enhancing fluid collection. Normal pancreatic enhancement. 2. Small amount  of free fluid in the abdomen and pelvis. 3. Moderate-sized hiatal hernia containing the proximal stomach. 4. Enlarged, lobulated uterus with multiple fibroids measuring up to 5.9 cm. Ovaries are not well delineated on  this study. Electronically signed by: Greig Pique MD 11/24/2024 07:04 PM EST RP Workstation: [id number]       Assessment / Plan:    58 y/o female here with the following:  Acute pancreatitis - likely secondary to alcohol Elevated liver enzymes  See intake note for details. Suspect she has pancreatitis from alcohol (has 4 drinks per night). Her liver enzymes were elevated on admission but CT scan x 2 and RUQ US  have shown no gallstones or biliary ductal dilation. Trig level normal.  She does have intermittent elevations in AP at baseline, have sent AMA to further evaluate, and acute hep panel for transaminitis, but again think most likely enzyme elevation is secondary to alcohol.   Tolerating full liquids, her symptoms are slowly improving. I think can advance to low fat diet as tolerated today. If she does well with this and continues to improve, perhaps home tomorrow. Trend LFTs and labs daily.  Moving forward, recommend she completely abstain from alcohol given this occurrence. We discussed this for a bit, she agrees and understands.   She has a large hiatal hernia and some baseline upper tract symptoms, would start on protonix  once daily. She has scheduled follow up with us  on 12/25/24 in the clinic and can reassess her post hospital stay.  We will sign off for now, call with questions moving forward.  Marcey Naval, MD [facility]

## 2024-11-26 NOTE — Progress Notes (Addendum)
 Pt complains of chest pain 6 out of 10.  Reports it started after she finished her lunch.  MD Juvenal notified. New order for mylanta given. BP elevated. HR stable. 100% RA  PRN Dilaudid  given for pain.

## 2024-11-26 NOTE — Plan of Care (Signed)
   Problem: Education: Goal: Knowledge of General Education information will improve Description Including pain rating scale, medication(s)/side effects and non-pharmacologic comfort measures Outcome: Progressing

## 2024-11-26 NOTE — Progress Notes (Signed)
 PROGRESS NOTE    TAJANA CROTTEAU  FMW:[id number] DOB: Aug 12, 1966 DOA: 11/24/2024 PCP: Sherre Geni LABOR, NP    Brief Narrative:  Hannah Hale is a 58 y.o. female with medical history significant of alcohol abuse, L breast cancer s/p L mastectomy w/ sentinel lymph node biopsy in 2013 followed by chemotherapy/radiation, HTN, DM2, prior LLE DVT, fibroids, and GERD who p/w acute pancreatitis c/b alcohol withdrawal.    Assessment and Plan: Acute pancreatitis -Improved - Advance diet -trend labs -home in AM?  Alcohol withdrawal Alcohol abuse -IV/PO ativan  w/ thiamine /folate per CIWA protocol -Needs complete cessation   Hypokalemia/hypomagnesemia Repleted   HTN -PTA irbesartan -hydrochlorothiazide  150-12.5mg  daily   Mood disorder Pt PHQ-2 positive and with reported increased stress/anxiety since the death of her husband this past June - Outpatient follow-up with PCP   DVT prophylaxis: enoxaparin  (LOVENOX ) injection 40 mg Start: 11/25/24 1000    Code Status: Full Code   Disposition Plan:  Level of care: Med-Surg Status is: Inpatient     Consultants:  GI   Subjective: Tolerating full liquid diet  Objective: Vitals:   11/26/24 0011 11/26/24 0111 11/26/24 0600 11/26/24 0819  BP: 139/86 135/82  (!) 157/84  Pulse: 93 89 93 93  Resp:    17  Temp:    98.8 F (37.1 C)  TempSrc:    Oral  SpO2:    100%  Weight:      Height:       No intake or output data in the 24 hours ending 11/26/24 1305 Filed Weights   11/24/24 1546 11/25/24 0617  Weight: 68 kg 70.1 kg    Examination:   General: Appearance:     Overweight female in no acute distress     Lungs:     Clear to auscultation bilaterally, respirations unlabored  Heart:    Normal heart rate. Normal rhythm. No murmurs, rubs, or gallops.    MS:   All extremities are intact.    Neurologic:   Awake, alert, oriented x 3. No apparent focal neurological           defect.        Data Reviewed: I have  personally reviewed following labs and imaging studies  CBC: Recent Labs  Lab 11/22/24 0845 11/24/24 1220 11/25/24 0957 11/26/24 0906  WBC 7.4 13.0* 13.4* 10.6*  NEUTROABS 3.5 10.3* 10.4*  --   HGB 13.4 12.9 10.8* 11.0*  HCT 38.2 38.1 31.8* 32.4*  MCV 89.9 92.5 91.4 92.0  PLT 249 219.0 184 205   Basic Metabolic Panel: Recent Labs  Lab 11/22/24 0845 11/24/24 1645 11/25/24 0957 11/25/24 1546 11/26/24 0906  NA 135 136 135  --  135  K 3.9 2.9* 3.0*  --  3.8  CL 99 103 107  --  105  CO2 19* 19* 19*  --  21*  GLUCOSE 163* 117* 120*  --  121*  BUN 19 9 6   --  5*  CREATININE 0.97 1.08* 0.88  --  0.80  CALCIUM  9.0 8.1* 8.0*  --  8.8*  MG  --   --   --  1.4*  --    GFR: Estimated Creatinine Clearance: 74.6 mL/min (by C-G formula based on SCr of 0.8 mg/dL). Liver Function Tests: Recent Labs  Lab 11/22/24 0845 11/24/24 1220 11/25/24 0957  AST 335* 116* 71*  ALT 178* 84* 55*  ALKPHOS 297* 204* 163*  BILITOT 0.6 1.4* 1.1  PROT 6.7 7.0 5.8*  ALBUMIN 3.3* 3.4* 2.4*  Recent Labs  Lab 11/22/24 0845 11/24/24 1220 11/25/24 0957  LIPASE 136* 2,955.0* 441*   No results for input(s): AMMONIA in the last 168 hours. Coagulation Profile: No results for input(s): INR, PROTIME in the last 168 hours. Cardiac Enzymes: No results for input(s): CKTOTAL, CKMB, CKMBINDEX, TROPONINI in the last 168 hours. BNP (last 3 results) No results for input(s): PROBNP in the last 8760 hours. HbA1C: No results for input(s): HGBA1C in the last 72 hours. CBG: No results for input(s): GLUCAP in the last 168 hours. Lipid Profile: Recent Labs    11/25/24 0957  TRIG 74   Thyroid Function Tests: No results for input(s): TSH, T4TOTAL, FREET4, T3FREE, THYROIDAB in the last 72 hours. Anemia Panel: No results for input(s): VITAMINB12, FOLATE, FERRITIN, TIBC, IRON, RETICCTPCT in the last 72 hours. Sepsis Labs: No results for input(s): PROCALCITON,  LATICACIDVEN in the last 168 hours.  No results found for this or any previous visit (from the past 240 hours).       Radiology Studies: CT ABDOMEN PELVIS W CONTRAST Result Date: 11/24/2024 EXAM: CT ABDOMEN AND PELVIS WITH CONTRAST 11/22/2024 TECHNIQUE: CT of the abdomen and pelvis was performed with the administration of 100 mL of iohexol  (OMNIPAQUE ) 300 MG/ML solution. Multiplanar reformatted images are provided for review. Automated exposure control, iterative reconstruction, and/or weight-based adjustment of the mA/kV was utilized to reduce the radiation dose to as low as reasonably achievable. COMPARISON: None available. CLINICAL HISTORY: Pancreatitis, acute, severe FINDINGS: LOWER CHEST: No acute abnormality. LIVER: There is a 2.8 x 1.9 cm hypodense area with some peripheral enhancing puddling on delayed imaging favoring hemangioma. This is unchanged from prior. There is diffuse fatty infiltration of the liver, unchanged. GALLBLADDER AND BILE DUCTS: Gallbladder and bile ducts are within normal limits. SPLEEN: No acute abnormality. PANCREAS: There is mild diffuse inflammatory stranding surrounding the entire pancreas. No pancreatic ductal dilatation or enhancing fluid collection. Pancreas enhances normally. ADRENAL GLANDS: No acute abnormality. KIDNEYS, URETERS AND BLADDER: There is a 7 mm calculus in the inferior pole of the left kidney. The kidneys are otherwise within normal limits. No hydronephrosis. No perinephric or periureteral stranding. Urinary bladder is unremarkable. GI AND BOWEL: There is a moderate sized hiatal hernia containing the proximal stomach. The appendix appears normal. There is no bowel obstruction. PERITONEUM AND RETROPERITONEUM: There is a small amount of free fluid in the pelvis and bilateral upper quadrants. No free air. VASCULATURE: Aorta is normal in caliber. There are atherosclerotic calcifications of the aorta. LYMPH NODES: No lymphadenopathy. REPRODUCTIVE ORGANS: The  uterus is enlarged and lobulated containing numerous fibroids measuring up to 5.9 cm. Ovaries are not well delineated on this study. BONES AND SOFT TISSUES: No acute osseous abnormality. No focal soft tissue abnormality. IMPRESSION: 1. Mild diffuse inflammatory stranding surrounding the pancreas, consistent with acute pancreatitis. No pancreatic ductal dilatation or enhancing fluid collection. Normal pancreatic enhancement. 2. Small amount of free fluid in the abdomen and pelvis. 3. Moderate-sized hiatal hernia containing the proximal stomach. 4. Enlarged, lobulated uterus with multiple fibroids measuring up to 5.9 cm. Ovaries are not well delineated on this study. Electronically signed by: Greig Pique MD 11/24/2024 07:04 PM EST RP Workstation: [id number]        Scheduled Meds:  atorvastatin   20 mg Oral Daily   enoxaparin  (LOVENOX ) injection  40 mg Subcutaneous Daily   irbesartan   150 mg Oral Daily   And   hydrochlorothiazide   12.5 mg Oral Daily   LORazepam   0-4 mg Intravenous Q6H  Or   LORazepam   0-4 mg Oral Q6H   [START ON 11/27/2024] LORazepam   0-4 mg Intravenous Q12H   Or   [START ON 11/27/2024] LORazepam   0-4 mg Oral Q12H   pantoprazole   40 mg Oral Daily   polyethylene glycol  17 g Oral Daily   senna  1 tablet Oral Daily   thiamine   100 mg Oral Daily   Or   thiamine   100 mg Intravenous Daily   Continuous Infusions:  lactated ringers  150 mL/hr at 11/26/24 1000     LOS: 1 day    Time spent: 45 minutes spent on chart review, discussion with nursing staff, consultants, updating family and interview/physical exam; more than 50% of that time was spent in counseling and/or coordination of care.    Harlene RAYMOND Bowl, DO [facility] Available via Epic secure chat 7am-7pm After these hours, please refer to coverage provider listed on [email or web address] 11/26/2024, 1:05 PM

## 2024-11-26 NOTE — Progress Notes (Signed)
 Patient reports feeling better since receiving pain medication and Mylanta @1419 . No complaints of pain at this time.

## 2024-11-27 ENCOUNTER — Encounter (HOSPITAL_COMMUNITY): Payer: Self-pay | Admitting: Hospitalist

## 2024-11-27 LAB — COMPREHENSIVE METABOLIC PANEL WITH GFR
ALT: 39 U/L (ref 0–44)
AST: 44 U/L — ABNORMAL HIGH (ref 15–41)
Albumin: 2.2 g/dL — ABNORMAL LOW (ref 3.5–5.0)
Alkaline Phosphatase: 144 U/L — ABNORMAL HIGH (ref 38–126)
Anion gap: 7 (ref 5–15)
BUN: <5 mg/dL — ABNORMAL LOW (ref 6–20)
CO2: 25 mmol/L (ref 22–32)
Calcium: 8.5 mg/dL — ABNORMAL LOW (ref 8.9–10.3)
Chloride: 101 mmol/L (ref 98–111)
Creatinine, Ser: 0.77 mg/dL (ref 0.44–1.00)
GFR, Estimated: >60 mL/min (ref >60)
Glucose, Bld: 105 mg/dL — ABNORMAL HIGH (ref 70–99)
Potassium: 4 mmol/L (ref 3.5–5.1)
Sodium: 133 mmol/L — ABNORMAL LOW (ref 135–145)
Total Bilirubin: 1.2 mg/dL (ref 0.0–1.2)
Total Protein: 5.6 g/dL — ABNORMAL LOW (ref 6.5–8.1)

## 2024-11-27 LAB — CBC
HCT: 29.8 % — ABNORMAL LOW (ref 36.0–46.0)
Hemoglobin: 10 g/dL — ABNORMAL LOW (ref 12.0–15.0)
MCH: 31.3 pg (ref 26.0–34.0)
MCHC: 33.6 g/dL (ref 30.0–36.0)
MCV: 93.1 fL (ref 80.0–100.0)
Platelets: 189 K/uL (ref 150–400)
RBC: 3.2 MIL/uL — ABNORMAL LOW (ref 3.87–5.11)
RDW: 14.3 % (ref 11.5–15.5)
WBC: 9.6 K/uL (ref 4.0–10.5)
nRBC: 0 % (ref 0.0–0.2)

## 2024-11-27 LAB — MITOCHONDRIAL ANTIBODIES: Mitochondrial M2 Ab, IgG: <20 U (ref 0.0–20.0)

## 2024-11-27 LAB — MAGNESIUM: Magnesium: 1.6 mg/dL — ABNORMAL LOW (ref 1.7–2.4)

## 2024-11-27 MED ORDER — OXYCODONE HCL 5 MG PO TABS
5.0000 mg | ORAL_TABLET | ORAL | Status: DC | PRN
  Administered 2024-11-27 – 2024-11-30 (×10): 5 mg via ORAL
  Filled 2024-11-27 (×10): qty 1

## 2024-11-27 MED ORDER — HYDROMORPHONE HCL 1 MG/ML IJ SOLN
0.5000 mg | INTRAMUSCULAR | Status: DC | PRN
  Administered 2024-11-27 – 2024-11-30 (×7): 0.5 mg via INTRAVENOUS
  Filled 2024-11-27 (×7): qty 0.5

## 2024-11-27 MED ORDER — LACTATED RINGERS IV SOLN: INTRAVENOUS | Status: AC

## 2024-11-27 MED ORDER — IRBESARTAN 300 MG PO TABS
300.0000 mg | ORAL_TABLET | Freq: Every day | ORAL | Status: DC
  Administered 2024-11-27 – 2024-11-30 (×4): 300 mg via ORAL
  Filled 2024-11-27 (×4): qty 1

## 2024-11-27 MED ORDER — MAGNESIUM SULFATE 2 GM/50ML IV SOLN
2.0000 g | Freq: Once | INTRAVENOUS | Status: AC
  Administered 2024-11-27: 09:00:00 2 g via INTRAVENOUS
  Filled 2024-11-27: qty 50

## 2024-11-27 NOTE — Progress Notes (Signed)
 PROGRESS NOTE    Hannah Hale  FMW:[id number] DOB: Mar 22, 1966 DOA: 11/24/2024 PCP: Sherre Geni LABOR, NP    Brief Narrative:  Hannah Hale is a 58 y.o. female with medical history significant of alcohol abuse, L breast cancer s/p L mastectomy w/ sentinel lymph node biopsy in 2013 followed by chemotherapy/radiation, HTN, DM2, prior LLE DVT, fibroids, and GERD who p/w acute pancreatitis c/b alcohol withdrawal.    Assessment and Plan: Acute pancreatitis - Diet advanced on 12/14-patient now with increased pain needing IV pain medication - Labs improving - Due to increased pain, will make patient n.p.o. again except sips and allow for further bowel rest - Continue IV fluids  Alcohol withdrawal Alcohol abuse -IV/PO ativan  w/ thiamine /folate per CIWA protocol -Needs complete cessation   Hypokalemia/hypomagnesemia Repleted magnesium  again   HTN -PTA irbesartan -hydrochlorothiazide  150-12.5mg  daily-hold hydrochlorothiazide    Mood disorder Pt PHQ-2 positive and with reported increased stress/anxiety since the death of her husband this past June - Outpatient follow-up with PCP   DVT prophylaxis: enoxaparin  (LOVENOX ) injection 40 mg Start: 11/25/24 1000    Code Status: Full Code   Disposition Plan:  Level of care: Med-Surg Status is: Inpatient     Consultants:  GI   Subjective: Had increased pain with the low-fat diet  Objective: Vitals:   11/26/24 1709 11/26/24 2040 11/27/24 0200 11/27/24 0417  BP: (!) 167/90 (!) 156/86 (!) 148/87 (!) 155/97  Pulse: 92 92 89 87  Resp: 14 16  16   Temp: 98.6 F (37 C) 98.2 F (36.8 C)  98.5 F (36.9 C)  TempSrc: Oral Oral  Oral  SpO2: 100% 96%  100%  Weight:      Height:        Intake/Output Summary (Last 24 hours) at 11/27/2024 1058 Last data filed at 11/27/2024 0315 Gross per 24 hour  Intake 2526.99 ml  Output --  Net 2526.99 ml   Filed Weights   11/24/24 1546 11/25/24 0617  Weight: 68 kg 70.1 kg     Examination:   General: Appearance:     Overweight female in no acute distress     Lungs:     respirations unlabored  Heart:    Normal heart rate.    MS:   All extremities are intact.    Neurologic:   Awake, alert, oriented x 3. No apparent focal neurological           defect.        Data Reviewed: I have personally reviewed following labs and imaging studies  CBC: Recent Labs  Lab 11/22/24 0845 11/24/24 1220 11/25/24 0957 11/26/24 0906 11/27/24 0452  WBC 7.4 13.0* 13.4* 10.6* 9.6  NEUTROABS 3.5 10.3* 10.4*  --   --   HGB 13.4 12.9 10.8* 11.0* 10.0*  HCT 38.2 38.1 31.8* 32.4* 29.8*  MCV 89.9 92.5 91.4 92.0 93.1  PLT 249 219.0 184 205 189   Basic Metabolic Panel: Recent Labs  Lab 11/22/24 0845 11/24/24 1645 11/25/24 0957 11/25/24 1546 11/26/24 0906 11/27/24 0452  NA 135 136 135  --  135 133*  K 3.9 2.9* 3.0*  --  3.8 4.0  CL 99 103 107  --  105 101  CO2 19* 19* 19*  --  21* 25  GLUCOSE 163* 117* 120*  --  121* 105*  BUN 19 9 6   --  5* <5*  CREATININE 0.97 1.08* 0.88  --  0.80 0.77  CALCIUM  9.0 8.1* 8.0*  --  8.8* 8.5*  MG  --   --   --  1.4*  --  1.6*   GFR: Estimated Creatinine Clearance: 74.6 mL/min (by C-G formula based on SCr of 0.77 mg/dL). Liver Function Tests: Recent Labs  Lab 11/22/24 0845 11/24/24 1220 11/25/24 0957 11/27/24 0452  AST 335* 116* 71* 44*  ALT 178* 84* 55* 39  ALKPHOS 297* 204* 163* 144*  BILITOT 0.6 1.4* 1.1 1.2  PROT 6.7 7.0 5.8* 5.6*  ALBUMIN 3.3* 3.4* 2.4* 2.2*   Recent Labs  Lab 11/22/24 0845 11/24/24 1220 11/25/24 0957  LIPASE 136* 2,955.0* 441*   No results for input(s): AMMONIA in the last 168 hours. Coagulation Profile: No results for input(s): INR, PROTIME in the last 168 hours. Cardiac Enzymes: No results for input(s): CKTOTAL, CKMB, CKMBINDEX, TROPONINI in the last 168 hours. BNP (last 3 results) No results for input(s): PROBNP in the last 8760 hours. HbA1C: No results for  input(s): HGBA1C in the last 72 hours. CBG: No results for input(s): GLUCAP in the last 168 hours. Lipid Profile: Recent Labs    11/25/24 0957  TRIG 74   Thyroid Function Tests: No results for input(s): TSH, T4TOTAL, FREET4, T3FREE, THYROIDAB in the last 72 hours. Anemia Panel: No results for input(s): VITAMINB12, FOLATE, FERRITIN, TIBC, IRON, RETICCTPCT in the last 72 hours. Sepsis Labs: No results for input(s): PROCALCITON, LATICACIDVEN in the last 168 hours.  No results found for this or any previous visit (from the past 240 hours).       Radiology Studies: No results found.       Scheduled Meds:  atorvastatin   20 mg Oral Daily   enoxaparin  (LOVENOX ) injection  40 mg Subcutaneous Daily   irbesartan   300 mg Oral Daily   LORazepam   0-4 mg Intravenous Q12H   Or   LORazepam   0-4 mg Oral Q12H   pantoprazole   40 mg Oral Daily   polyethylene glycol  17 g Oral Daily   senna  1 tablet Oral Daily   thiamine   100 mg Oral Daily   Or   thiamine   100 mg Intravenous Daily   Continuous Infusions:  lactated ringers        LOS: 2 days    Time spent: 45 minutes spent on chart review, discussion with nursing staff, consultants, updating family and interview/physical exam; more than 50% of that time was spent in counseling and/or coordination of care.    Harlene RAYMOND Bowl, DO [facility] Available via Epic secure chat 7am-7pm After these hours, please refer to coverage provider listed on [email or web address] 11/27/2024, 10:58 AM

## 2024-11-27 NOTE — Plan of Care (Signed)
   Problem: Education: Goal: Knowledge of General Education information will improve Description Including pain rating scale, medication(s)/side effects and non-pharmacologic comfort measures Outcome: Progressing

## 2024-11-27 NOTE — Progress Notes (Signed)
 Room 31 battery changed

## 2024-11-27 NOTE — Progress Notes (Deleted)
 Pt arrived back to 6 north room 31 alert and oriented x4. Pain level 4/10 at incision site. Incision site with steri strips all clean dry and intact. Bed in lowest position. Call light in reach. All needs met at this time.

## 2024-11-27 NOTE — Progress Notes (Signed)
Full liquid tray ordered for pt.

## 2024-11-28 ENCOUNTER — Telehealth: Payer: Self-pay | Admitting: Gastroenterology

## 2024-11-28 DIAGNOSIS — R1013 Epigastric pain: Secondary | ICD-10-CM

## 2024-11-28 LAB — GLUCOSE, CAPILLARY: Glucose-Capillary: 155 mg/dL — ABNORMAL HIGH (ref 70–99)

## 2024-11-28 MED ORDER — DICYCLOMINE HCL 10 MG PO CAPS: 20.0000 mg | ORAL_CAPSULE | Freq: Three times a day (TID) | ORAL | Status: DC

## 2024-11-28 MED ORDER — DICYCLOMINE HCL 20 MG PO TABS
20.0000 mg | ORAL_TABLET | Freq: Three times a day (TID) | ORAL | Status: DC
  Filled 2024-11-28: qty 1

## 2024-11-28 MED ORDER — SENNA 8.6 MG PO TABS
1.0000 | ORAL_TABLET | Freq: Two times a day (BID) | ORAL | Status: DC
  Administered 2024-11-28 – 2024-11-30 (×4): 8.6 mg via ORAL
  Filled 2024-11-28 (×4): qty 1

## 2024-11-28 MED ORDER — POLYETHYLENE GLYCOL 3350 17 G PO PACK
17.0000 g | PACK | Freq: Two times a day (BID) | ORAL | Status: DC
  Administered 2024-11-28 – 2024-11-30 (×3): 17 g via ORAL
  Filled 2024-11-28 (×3): qty 1

## 2024-11-28 MED ORDER — ENOXAPARIN SODIUM 40 MG/0.4ML IJ SOSY
40.0000 mg | PREFILLED_SYRINGE | Freq: Every day | INTRAMUSCULAR | Status: DC
  Administered 2024-11-30: 10:00:00 40 mg via SUBCUTANEOUS
  Filled 2024-11-28: qty 0.4

## 2024-11-28 MED ORDER — PANTOPRAZOLE SODIUM 40 MG PO TBEC
40.0000 mg | DELAYED_RELEASE_TABLET | Freq: Two times a day (BID) | ORAL | Status: DC
  Administered 2024-11-28 – 2024-11-30 (×4): 40 mg via ORAL
  Filled 2024-11-28 (×4): qty 1

## 2024-11-28 MED ORDER — DICYCLOMINE HCL 10 MG PO CAPS
20.0000 mg | ORAL_CAPSULE | Freq: Three times a day (TID) | ORAL | Status: DC
  Administered 2024-11-28 (×2): 20 mg via ORAL
  Filled 2024-11-28 (×2): qty 2

## 2024-11-28 MED ORDER — AMLODIPINE BESYLATE 5 MG PO TABS
5.0000 mg | ORAL_TABLET | Freq: Every day | ORAL | Status: DC
  Administered 2024-11-28 – 2024-11-30 (×3): 5 mg via ORAL
  Filled 2024-11-28 (×3): qty 1

## 2024-11-28 MED ORDER — MAGNESIUM SULFATE 2 GM/50ML IV SOLN
2.0000 g | Freq: Once | INTRAVENOUS | Status: AC
  Administered 2024-11-28: 13:00:00 2 g via INTRAVENOUS
  Filled 2024-11-28: qty 50

## 2024-11-28 NOTE — Progress Notes (Addendum)
 PROGRESS NOTE    Hannah Hale  FMW:[id number] DOB: [birth year] DOA: 11/24/2024 PCP: Sherre Geni LABOR, NP    Brief Narrative:  NIAOMI Hale is a 58 y.o. female with medical history significant of alcohol abuse, L breast cancer s/p L mastectomy w/ sentinel lymph node biopsy in 2013 followed by chemotherapy/radiation, HTN, DM2, prior LLE DVT, fibroids, and GERD who p/w acute pancreatitis c/b alcohol withdrawal.  Hospital stay complicated by continued pain despite further bowel rest and cramping   Assessment and Plan: Acute pancreatitis - Diet advanced on 12/14-patient now with increased pain needing IV pain medication - Labs improving -will leave on full liquid diet -trial of bentyl  as she is c/o cramping -bowel regimen  Addendum-patient is only got 1 dose of Bentyl  but states it did not help, will ask GI to weigh back in  Alcohol withdrawal Alcohol abuse -IV/PO ativan  w/ thiamine /folate per CIWA protocol -Needs complete cessation   Hypokalemia/hypomagnesemia Repleted magnesium  again   HTN -PTA irbesartan -hydrochlorothiazide  150-12.5mg  daily-hold hydrochlorothiazide    Mood disorder Pt PHQ-2 positive and with reported increased stress/anxiety since the death of her husband this past June - Outpatient follow-up with PCP - Question trial of Cymbalta-for pain and depression   DVT prophylaxis: enoxaparin  (LOVENOX ) injection 40 mg Start: 11/25/24 1000    Code Status: Full Code   Disposition Plan:  Level of care: Med-Surg Status is: Inpatient     Consultants:  GI   Subjective: C/o cramping pain (not consistent with pancreatitis)  Objective: Vitals:   11/27/24 1828 11/28/24 0158 11/28/24 0629 11/28/24 1023  BP: (!) 175/90 (!) 154/83 (!) 165/85 (!) 165/90  Pulse: 80 78 76 78  Resp: 17 16 16 18   Temp: 98.8 F (37.1 C) 99 F (37.2 C) 98.6 F (37 C) 97.7 F (36.5 C)  TempSrc: Oral Oral Oral   SpO2: 100% 99% 100% 99%  Weight:      Height:         Intake/Output Summary (Last 24 hours) at 11/28/2024 1158 Last data filed at 11/28/2024 9341 Gross per 24 hour  Intake 2058.93 ml  Output --  Net 2058.93 ml   Filed Weights   11/24/24 1546 11/25/24 0617  Weight: 68 kg 70.1 kg    Examination:   General: Appearance:     Overweight female in no acute distress     Lungs:     respirations unlabored  Heart:    Normal heart rate.    MS:   All extremities are intact.    Neurologic:   Awake, alert, oriented x 3. No apparent focal neurological           defect.        Data Reviewed: I have personally reviewed following labs and imaging studies  CBC: Recent Labs  Lab 11/22/24 0845 11/24/24 1220 11/25/24 0957 11/26/24 0906 11/27/24 0452  WBC 7.4 13.0* 13.4* 10.6* 9.6  NEUTROABS 3.5 10.3* 10.4*  --   --   HGB 13.4 12.9 10.8* 11.0* 10.0*  HCT 38.2 38.1 31.8* 32.4* 29.8*  MCV 89.9 92.5 91.4 92.0 93.1  PLT 249 219.0 184 205 189   Basic Metabolic Panel: Recent Labs  Lab 11/22/24 0845 11/24/24 1645 11/25/24 0957 11/25/24 1546 11/26/24 0906 11/27/24 0452  NA 135 136 135  --  135 133*  K 3.9 2.9* 3.0*  --  3.8 4.0  CL 99 103 107  --  105 101  CO2 19* 19* 19*  --  21* 25  GLUCOSE 163* 117*  120*  --  121* 105*  BUN 19 9 6   --  5* <5*  CREATININE 0.97 1.08* 0.88  --  0.80 0.77  CALCIUM  9.0 8.1* 8.0*  --  8.8* 8.5*  MG  --   --   --  1.4*  --  1.6*   GFR: Estimated Creatinine Clearance: 74.6 mL/min (by C-G formula based on SCr of 0.77 mg/dL). Liver Function Tests: Recent Labs  Lab 11/22/24 0845 11/24/24 1220 11/25/24 0957 11/27/24 0452  AST 335* 116* 71* 44*  ALT 178* 84* 55* 39  ALKPHOS 297* 204* 163* 144*  BILITOT 0.6 1.4* 1.1 1.2  PROT 6.7 7.0 5.8* 5.6*  ALBUMIN 3.3* 3.4* 2.4* 2.2*   Recent Labs  Lab 11/22/24 0845 11/24/24 1220 11/25/24 0957  LIPASE 136* 2,955.0* 441*   No results for input(s): AMMONIA in the last 168 hours. Coagulation Profile: No results for input(s): INR, PROTIME in  the last 168 hours. Cardiac Enzymes: No results for input(s): CKTOTAL, CKMB, CKMBINDEX, TROPONINI in the last 168 hours. BNP (last 3 results) No results for input(s): PROBNP in the last 8760 hours. HbA1C: No results for input(s): HGBA1C in the last 72 hours. CBG: No results for input(s): GLUCAP in the last 168 hours. Lipid Profile: No results for input(s): CHOL, HDL, LDLCALC, TRIG, CHOLHDL, LDLDIRECT in the last 72 hours.  Thyroid Function Tests: No results for input(s): TSH, T4TOTAL, FREET4, T3FREE, THYROIDAB in the last 72 hours. Anemia Panel: No results for input(s): VITAMINB12, FOLATE, FERRITIN, TIBC, IRON, RETICCTPCT in the last 72 hours. Sepsis Labs: No results for input(s): PROCALCITON, LATICACIDVEN in the last 168 hours.  No results found for this or any previous visit (from the past 240 hours).       Radiology Studies: No results found.       Scheduled Meds:  atorvastatin   20 mg Oral Daily   dicyclomine   20 mg Oral TID AC & HS   enoxaparin  (LOVENOX ) injection  40 mg Subcutaneous Daily   irbesartan   300 mg Oral Daily   LORazepam   0-4 mg Intravenous Q12H   Or   LORazepam   0-4 mg Oral Q12H   pantoprazole   40 mg Oral BID   polyethylene glycol  17 g Oral Daily   senna  1 tablet Oral BID   thiamine   100 mg Oral Daily   Or   thiamine   100 mg Intravenous Daily   Continuous Infusions:     LOS: 3 days    Time spent: 45 minutes spent on chart review, discussion with nursing staff, consultants, updating family and interview/physical exam; more than 50% of that time was spent in counseling and/or coordination of care.    Harlene RAYMOND Bowl, DO [facility] Available via Epic secure chat 7am-7pm After these hours, please refer to coverage provider listed on [email or web address] 11/28/2024, 11:58 AM

## 2024-11-28 NOTE — Progress Notes (Addendum)
 Patient ID: Hannah Hale, female   DOB: 08/14/1966, 58 y.o.   MRN: [id number]    Progress Note   Subjective  Day #5 CC; persistent epigastric/left upper quadrant pain  Was seen by the GI service earlier this admission when admitted on 11/25/2024.  We are asked to reevaluate her today due to persistent complaints of epigastric and left upper quadrant pain which will radiate around into her back at times. Workup on admission with CT of the abdomen and pelvis showed a 2.8 x 1.9 cm hypodense area in the liver consistent with a hemangioma, normal-appearing gallbladder and diffuse inflammatory changes around the pancreas consistent with acute pancreatitis. Labs also consistent with pancreatitis with lipase of 2955, WBC of 13,000, and was hypokalemic on admission due to nausea and vomiting.  Patient has improved since admission, states this afternoon that her symptoms are about 50% better, she is not having any vomiting and has been keeping down some liquids.  She is very concerned because she continues to experience epigastric and left upper quadrant pain fairly immediately postprandially.  Her sister who is at bedside, also concerned as patient has history of breast cancer and they want to be sure that she does not have any other issues causing her current symptoms. Patient says prior to the onset of the pancreatitis she had been having some intermittent epigastric/left upper quadrant pain over the past couple of months.  She had an appoint to be seen outpatient but not until January 2026.  Patient had endorsed drinking 4 alcoholic beverages per day per initial GI consult, today she says had just been drinking a couple. Currently on oral narcotics still requiring every 4 hours but says today it seems to be controlling the pain better.  She has not had a bowel movement since admission.  Labs today still pending labs yesterday-WBC 9.6/hemoglobin 10/hematocrit 29.8 Sodium 133/potassium 4/BUN less  than 5/creatinine 0.77 Albumin 2.2 T. bili 1.2/alk phos 144/AST 44/ALT 39-improved     Objective   Vital signs in last 24 hours: Temp:  [97.7 F (36.5 C)-99 F (37.2 C)] 98.1 F (36.7 C) (12/16 1620) Pulse Rate:  [73-84] 73 (12/16 1620) Resp:  [16-18] 18 (12/16 1620) BP: (154-175)/(83-90) 164/89 (12/16 1620) SpO2:  [98 %-100 %] 100 % (12/16 1620) Last BM Date : 11/24/24 General:    Older African-American female in NAD sister at bedside Heart:  Regular rate and rhythm; no murmurs Lungs: Respirations even and unlabored, lungs CTA bilaterally Abdomen:  Soft, she is tender in the epigastrium, hypogastrium and left upper quadrant, no guarding or rebound no palpable mass or hepatosplenomegaly. Normal bowel sounds. Extremities:  Without edema. Neurologic:  Alert and oriented,  grossly normal neurologically. Psych:  Cooperative. Normal mood and affect.  Intake/Output from previous day: 12/15 0701 - 12/16 0700 In: 2058.9 [P.O.:240; I.V.:1818.9] Out: -  Intake/Output this shift: No intake/output data recorded.  Lab Results: Recent Labs    11/26/24 0906 11/27/24 0452  WBC 10.6* 9.6  HGB 11.0* 10.0*  HCT 32.4* 29.8*  PLT 205 189   BMET Recent Labs    11/26/24 0906 11/27/24 0452  NA 135 133*  K 3.8 4.0  CL 105 101  CO2 21* 25  GLUCOSE 121* 105*  BUN 5* <5*  CREATININE 0.80 0.77  CALCIUM  8.8* 8.5*   LFT Recent Labs    11/27/24 0452  PROT 5.6*  ALBUMIN 2.2*  AST 44*  ALT 39  ALKPHOS 144*  BILITOT 1.2   PT/INR No results for  input(s): LABPROT, INR in the last 72 hours.    Assessment / Plan:    #22  58 year old female admitted 4 days ago with 3 to 4-day history of upper abdominal pain with nausea Workup with labs and CT of the abdomen and pelvis consistent with acute pancreatitis/EtOH induced.  Patient has had improvement in symptoms, is tolerating full liquids and pain is fairly well-controlled with oral narcotics she continues to require pain  medication for pain.   Suspect that she is still having symptoms from her acute pancreatitis which may take several days to another week to resolve.  Patient and family concerned due to her previous history of breast cancer about ruling out any underlying malignancy and also concerned about intermittent epigastric and left upper quadrant pain that had preceded this episode of acute pancreatitis. Certainly reasonable to do EGD to evaluate her esophagus and stomach.  #2 history of breast cancer status post left mastectomy 2013/chemotherapy radiation 3.  Diabetes mellitus 4.  Hypertension 5.  History of GERD 6.  Prior history of left lower extremity DVT  Plan; continue full liquid diet, n.p.o. past midnight Patient will be scheduled for EGD with Dr. Albertus for tomorrow morning 11/29/2024.  Procedure was discussed in detail with patient including indications risk and benefits and she is agreeable to proceed. Further GI recommendations pending findings at EGD. Daily PPI Increase MiraLAX  to twice daily until having good bowel movements Continue pain control and antiemetics. Follow-up pending labs, repeat lipase   Principal Problem:   Acute alcoholic pancreatitis Active Problems:   Acute pancreatitis   Elevated liver enzymes     LOS: 3 days   Xeng Kucher PA-C 11/28/2024, 4:24 PM

## 2024-11-28 NOTE — Telephone Encounter (Signed)
 Inbound call from patient sister Grayce Kemp stating that she just got into town due to her sister being admitted to the hospital. Grayce stated that there has been not testing done and she not not been seen by any doctor. Patient is in a lot of pain and no one can tell them what the cause of her pain is. Grayce stated that she has been given pain medication but no one has came in the advise on treatment option or diagnosis. She stated that hospital is only guessing she is having IBS or pancreatitis pain. Grayce is requesting a call back from Dr. Leigh. Good call back number is [phone]. Please advise.

## 2024-11-28 NOTE — H&P (View-Only) (Signed)
 Patient ID: Hannah Hale, female   DOB: 08/14/1966, 58 y.o.   MRN: [id number]    Progress Note   Subjective  Day #5 CC; persistent epigastric/left upper quadrant pain  Was seen by the GI service earlier this admission when admitted on 11/25/2024.  We are asked to reevaluate her today due to persistent complaints of epigastric and left upper quadrant pain which will radiate around into her back at times. Workup on admission with CT of the abdomen and pelvis showed a 2.8 x 1.9 cm hypodense area in the liver consistent with a hemangioma, normal-appearing gallbladder and diffuse inflammatory changes around the pancreas consistent with acute pancreatitis. Labs also consistent with pancreatitis with lipase of 2955, WBC of 13,000, and was hypokalemic on admission due to nausea and vomiting.  Patient has improved since admission, states this afternoon that her symptoms are about 50% better, she is not having any vomiting and has been keeping down some liquids.  She is very concerned because she continues to experience epigastric and left upper quadrant pain fairly immediately postprandially.  Her sister who is at bedside, also concerned as patient has history of breast cancer and they want to be sure that she does not have any other issues causing her current symptoms. Patient says prior to the onset of the pancreatitis she had been having some intermittent epigastric/left upper quadrant pain over the past couple of months.  She had an appoint to be seen outpatient but not until January 2026.  Patient had endorsed drinking 4 alcoholic beverages per day per initial GI consult, today she says had just been drinking a couple. Currently on oral narcotics still requiring every 4 hours but says today it seems to be controlling the pain better.  She has not had a bowel movement since admission.  Labs today still pending labs yesterday-WBC 9.6/hemoglobin 10/hematocrit 29.8 Sodium 133/potassium 4/BUN less  than 5/creatinine 0.77 Albumin 2.2 T. bili 1.2/alk phos 144/AST 44/ALT 39-improved     Objective   Vital signs in last 24 hours: Temp:  [97.7 F (36.5 C)-99 F (37.2 C)] 98.1 F (36.7 C) (12/16 1620) Pulse Rate:  [73-84] 73 (12/16 1620) Resp:  [16-18] 18 (12/16 1620) BP: (154-175)/(83-90) 164/89 (12/16 1620) SpO2:  [98 %-100 %] 100 % (12/16 1620) Last BM Date : 11/24/24 General:    Older African-American female in NAD sister at bedside Heart:  Regular rate and rhythm; no murmurs Lungs: Respirations even and unlabored, lungs CTA bilaterally Abdomen:  Soft, she is tender in the epigastrium, hypogastrium and left upper quadrant, no guarding or rebound no palpable mass or hepatosplenomegaly. Normal bowel sounds. Extremities:  Without edema. Neurologic:  Alert and oriented,  grossly normal neurologically. Psych:  Cooperative. Normal mood and affect.  Intake/Output from previous day: 12/15 0701 - 12/16 0700 In: 2058.9 [P.O.:240; I.V.:1818.9] Out: -  Intake/Output this shift: No intake/output data recorded.  Lab Results: Recent Labs    11/26/24 0906 11/27/24 0452  WBC 10.6* 9.6  HGB 11.0* 10.0*  HCT 32.4* 29.8*  PLT 205 189   BMET Recent Labs    11/26/24 0906 11/27/24 0452  NA 135 133*  K 3.8 4.0  CL 105 101  CO2 21* 25  GLUCOSE 121* 105*  BUN 5* <5*  CREATININE 0.80 0.77  CALCIUM  8.8* 8.5*   LFT Recent Labs    11/27/24 0452  PROT 5.6*  ALBUMIN 2.2*  AST 44*  ALT 39  ALKPHOS 144*  BILITOT 1.2   PT/INR No results for  input(s): LABPROT, INR in the last 72 hours.    Assessment / Plan:    #22  58 year old female admitted 4 days ago with 3 to 4-day history of upper abdominal pain with nausea Workup with labs and CT of the abdomen and pelvis consistent with acute pancreatitis/EtOH induced.  Patient has had improvement in symptoms, is tolerating full liquids and pain is fairly well-controlled with oral narcotics she continues to require pain  medication for pain.   Suspect that she is still having symptoms from her acute pancreatitis which may take several days to another week to resolve.  Patient and family concerned due to her previous history of breast cancer about ruling out any underlying malignancy and also concerned about intermittent epigastric and left upper quadrant pain that had preceded this episode of acute pancreatitis. Certainly reasonable to do EGD to evaluate her esophagus and stomach.  #2 history of breast cancer status post left mastectomy 2013/chemotherapy radiation 3.  Diabetes mellitus 4.  Hypertension 5.  History of GERD 6.  Prior history of left lower extremity DVT  Plan; continue full liquid diet, n.p.o. past midnight Patient will be scheduled for EGD with Dr. Albertus for tomorrow morning 11/29/2024.  Procedure was discussed in detail with patient including indications risk and benefits and she is agreeable to proceed. Further GI recommendations pending findings at EGD. Daily PPI Increase MiraLAX  to twice daily until having good bowel movements Continue pain control and antiemetics. Follow-up pending labs, repeat lipase   Principal Problem:   Acute alcoholic pancreatitis Active Problems:   Acute pancreatitis   Elevated liver enzymes     LOS: 3 days   Xeng Kucher PA-C 11/28/2024, 4:24 PM

## 2024-11-28 NOTE — Plan of Care (Signed)

## 2024-11-28 NOTE — Telephone Encounter (Signed)
 Unfortunately, I am unable to speak with Hannah Hale since she is on any DPR the patient has signed for our office nor is she listed as an emergency contact.  Patient HAS been seen by GI while in the hospital and actually has seen both Vina Dasen, NP and Dr Leigh. There have also been tests completed.   Patient will need to further discuss with her hospital inpatient team if she has additional concerns or questions in relation to her diagnosis or care.

## 2024-11-29 ENCOUNTER — Encounter (HOSPITAL_COMMUNITY): Admission: EM | Disposition: A | Payer: Self-pay | Source: Ambulatory Visit | Attending: Internal Medicine

## 2024-11-29 ENCOUNTER — Inpatient Hospital Stay (HOSPITAL_COMMUNITY): Admitting: Anesthesiology

## 2024-11-29 ENCOUNTER — Encounter (HOSPITAL_COMMUNITY): Payer: Self-pay | Admitting: Hospitalist

## 2024-11-29 DIAGNOSIS — M109 Gout, unspecified: Secondary | ICD-10-CM | POA: Diagnosis not present

## 2024-11-29 DIAGNOSIS — K449 Diaphragmatic hernia without obstruction or gangrene: Secondary | ICD-10-CM

## 2024-11-29 DIAGNOSIS — K3189 Other diseases of stomach and duodenum: Secondary | ICD-10-CM

## 2024-11-29 DIAGNOSIS — R1012 Left upper quadrant pain: Secondary | ICD-10-CM

## 2024-11-29 DIAGNOSIS — K852 Alcohol induced acute pancreatitis without necrosis or infection: Secondary | ICD-10-CM | POA: Diagnosis not present

## 2024-11-29 DIAGNOSIS — K2980 Duodenitis without bleeding: Secondary | ICD-10-CM

## 2024-11-29 HISTORY — PX: ESOPHAGOGASTRODUODENOSCOPY: SHX5428

## 2024-11-29 HISTORY — PX: BIOPSY OF SKIN SUBCUTANEOUS TISSUE AND/OR MUCOUS MEMBRANE: SHX6741

## 2024-11-29 LAB — COMPREHENSIVE METABOLIC PANEL WITH GFR
ALT: 32 U/L (ref 0–44)
AST: 34 U/L (ref 15–41)
Albumin: 2.9 g/dL — ABNORMAL LOW (ref 3.5–5.0)
Alkaline Phosphatase: 150 U/L — ABNORMAL HIGH (ref 38–126)
Anion gap: 9 (ref 5–15)
BUN: 6 mg/dL (ref 6–20)
CO2: 28 mmol/L (ref 22–32)
Calcium: 9.2 mg/dL (ref 8.9–10.3)
Chloride: 98 mmol/L (ref 98–111)
Creatinine, Ser: 0.77 mg/dL (ref 0.44–1.00)
GFR, Estimated: >60 mL/min (ref >60)
Glucose, Bld: 119 mg/dL — ABNORMAL HIGH (ref 70–99)
Potassium: 3.5 mmol/L (ref 3.5–5.1)
Sodium: 136 mmol/L (ref 135–145)
Total Bilirubin: 0.4 mg/dL (ref 0.0–1.2)
Total Protein: 5.9 g/dL — ABNORMAL LOW (ref 6.5–8.1)

## 2024-11-29 LAB — CBC
HCT: 29.5 % — ABNORMAL LOW (ref 36.0–46.0)
Hemoglobin: 10.1 g/dL — ABNORMAL LOW (ref 12.0–15.0)
MCH: 31.5 pg (ref 26.0–34.0)
MCHC: 34.2 g/dL (ref 30.0–36.0)
MCV: 91.9 fL (ref 80.0–100.0)
Platelets: 244 K/uL (ref 150–400)
RBC: 3.21 MIL/uL — ABNORMAL LOW (ref 3.87–5.11)
RDW: 13.7 % (ref 11.5–15.5)
WBC: 8.9 K/uL (ref 4.0–10.5)
nRBC: 0 % (ref 0.0–0.2)

## 2024-11-29 LAB — MAGNESIUM: Magnesium: 1.8 mg/dL (ref 1.7–2.4)

## 2024-11-29 LAB — URIC ACID: Uric Acid, Serum: 5.9 mg/dL (ref 2.5–7.1)

## 2024-11-29 LAB — LIPASE, BLOOD: Lipase: 664 U/L — ABNORMAL HIGH (ref 11–51)

## 2024-11-29 SURGERY — EGD (ESOPHAGOGASTRODUODENOSCOPY)
Anesthesia: Monitor Anesthesia Care

## 2024-11-29 MED ORDER — KETOROLAC TROMETHAMINE 15 MG/ML IJ SOLN
15.0000 mg | Freq: Once | INTRAMUSCULAR | Status: AC
  Administered 2024-11-29: 17:00:00 15 mg via INTRAVENOUS
  Filled 2024-11-29: qty 1

## 2024-11-29 MED ORDER — MAGNESIUM SULFATE 2 GM/50ML IV SOLN
2.0000 g | Freq: Once | INTRAVENOUS | Status: AC
  Administered 2024-11-29: 13:00:00 2 g via INTRAVENOUS
  Filled 2024-11-29: qty 50

## 2024-11-29 MED ORDER — LIDOCAINE 2% (20 MG/ML) 5 ML SYRINGE
INTRAMUSCULAR | Status: DC | PRN
  Administered 2024-11-29: 12:00:00 60 mg via INTRAVENOUS

## 2024-11-29 MED ORDER — COLCHICINE 0.6 MG PO TABS
0.6000 mg | ORAL_TABLET | Freq: Once | ORAL | Status: AC
  Administered 2024-11-29: 23:00:00 0.6 mg via ORAL
  Filled 2024-11-29: qty 1

## 2024-11-29 MED ORDER — COLCHICINE 0.6 MG PO TABS
1.2000 mg | ORAL_TABLET | Freq: Once | ORAL | Status: AC
  Administered 2024-11-29: 22:00:00 1.2 mg via ORAL
  Filled 2024-11-29: qty 2

## 2024-11-29 MED ORDER — PROPOFOL 10 MG/ML IV BOLUS
INTRAVENOUS | Status: DC | PRN
  Administered 2024-11-29: 12:00:00 50 mg via INTRAVENOUS
  Administered 2024-11-29: 12:00:00 100 ug/kg/min via INTRAVENOUS
  Administered 2024-11-29: 12:00:00 125 ug/kg/min via INTRAVENOUS

## 2024-11-29 MED ORDER — SODIUM CHLORIDE 0.9 % IV SOLN: INTRAVENOUS | Status: DC | PRN

## 2024-11-29 NOTE — Anesthesia Preprocedure Evaluation (Signed)
 Anesthesia Evaluation  Patient identified by MRN, date of birth, ID band Patient awake    Reviewed: Allergy & Precautions, NPO status , Patient's Chart, lab work & pertinent test results  Airway Mallampati: II  TM Distance: >3 FB     Dental  (+) Teeth Intact, Dental Advisory Given, Caps   Pulmonary Current Smoker and Patient abstained from smoking.   Pulmonary exam normal breath sounds clear to auscultation       Cardiovascular hypertension, Normal cardiovascular exam Rhythm:Regular Rate:Normal  EKG 11/22/24 NSR, possible LAE  Echo 12/28/12 LVEF 60-65%   Neuro/Psych negative neurological ROS  negative psych ROS   GI/Hepatic Neg liver ROS,GERD  Medicated,,Epigastric pain LUQ pain Acute pancreatitis   Endo/Other  diabetes, Well Controlled, Type 2  Hx/o left breast Ca S/P mastectomy + ChemoRx + RT 2013-2014  Renal/GU Renal diseaseHx/o nephrolithiasis  negative genitourinary   Musculoskeletal negative musculoskeletal ROS (+)    Abdominal  (+)  Abdomen: tender.   Peds  Hematology  (+) Blood dyscrasia, anemia Lovenox  therapy- last dose yesterday am 40mg    Anesthesia Other Findings   Reproductive/Obstetrics                              Anesthesia Physical Anesthesia Plan  ASA: 2  Anesthesia Plan: MAC   Post-op Pain Management: Minimal or no pain anticipated   Induction: Intravenous  PONV Risk Score and Plan: 2 and Treatment may vary due to age or medical condition and Propofol  infusion  Airway Management Planned: Natural Airway and Simple Face Mask  Additional Equipment: None  Intra-op Plan:   Post-operative Plan:   Informed Consent: I have reviewed the patients History and Physical, chart, labs and discussed the procedure including the risks, benefits and alternatives for the proposed anesthesia with the patient or authorized representative who has indicated his/her  understanding and acceptance.     Dental advisory given  Plan Discussed with: Anesthesiologist and CRNA  Anesthesia Plan Comments:          Anesthesia Quick Evaluation

## 2024-11-29 NOTE — Interval H&P Note (Signed)
 History and Physical Interval Note: For upper endoscopy today to evaluate epigastric pain in the setting of recent acute pancreatitis.  Rule out other causes. The nature of the procedure, as well as the risks, benefits, and alternatives were carefully and thoroughly reviewed with the patient. Ample time for discussion and questions allowed. The patient understood, was satisfied, and agreed to proceed.   11/29/2024 11:31 AM  Hannah Hale  has presented today for surgery, with the diagnosis of Epigastric/left upper quadrant pain, pancreatitis.  The various methods of treatment have been discussed with the patient and family. After consideration of risks, benefits and other options for treatment, the patient has consented to  Procedures: EGD (ESOPHAGOGASTRODUODENOSCOPY) (N/A) as a surgical intervention.  The patient's history has been reviewed, patient examined, no change in status, stable for surgery.  I have reviewed the patient's chart and labs.  Questions were answered to the patient's satisfaction.     Gordy HERO Kynlie Jane

## 2024-11-29 NOTE — Op Note (Signed)
 [facility] Patient Name: Hannah Hale Procedure Date : 11/29/2024 MRN: [id number] Attending MD: Gordy CHRISTELLA Starch , MD, [id number] Date of Birth: 12-31-1965 CSN: [id number] Age: 58 Admit Type: Inpatient Procedure:                Upper GI endoscopy Indications:              Epigastric abdominal pain, Abdominal pain in the                            left upper quadrant, hospitalization for acute                            pancreatitis Providers:                Gordy CHRISTELLA. Starch, MD, Ozell Pouch, Felice Sar,                            Technician Referring MD:             [facility] Medicines:                Monitored Anesthesia Care Complications:            No immediate complications. Estimated Blood Loss:     Estimated blood loss was minimal. Procedure:                Pre-Anesthesia Assessment:                           - Prior to the procedure, a History and Physical                            was performed, and patient medications and                            allergies were reviewed. The patient's tolerance of                            previous anesthesia was also reviewed. The risks                            and benefits of the procedure and the sedation                            options and risks were discussed with the patient.                            All questions were answered, and informed consent                            was obtained. Prior Anticoagulants: The patient has                            taken no anticoagulant or antiplatelet agents. ASA  Grade Assessment: II - A patient with mild systemic                            disease. After reviewing the risks and benefits,                            the patient was deemed in satisfactory condition to                            undergo the procedure.                           After obtaining informed consent, the endoscope was                             passed under direct vision. Throughout the                            procedure, the patient's blood pressure, pulse, and                            oxygen saturations were monitored continuously. The                            [id number]-H190 ([id number]) Olympus endoscope was introduced                            through the mouth, and advanced to the second part                            of duodenum. The upper GI endoscopy was                            accomplished without difficulty. The patient                            tolerated the procedure well. Scope In: Scope Out: Findings:      Normal mucosa was found in the entire esophagus.      A 7 cm hiatal hernia was present (top of gastric folds at 31 cm,       diaphragmatic hiatus at 38 cm from incisors).      The gastroesophageal flap valve was visualized endoscopically and       classified as Hill Grade IV (no fold, wide open lumen, hiatal hernia       present).      The entire examined stomach was normal. Biopsies were taken with a cold       forceps for Helicobacter pylori testing.      Moderate inflammation characterized by congestion (edema), erythema and       granularity was found in the duodenal bulb.      The second portion of the duodenum was normal. Impression:               - Normal mucosa was found in the entire esophagus.                           -  7 cm hiatal hernia.                           - Normal stomach. Biopsied to exclude H. Pylori                            bacterial infection given bulbar duodenitis.                           - Acute bulbar duodenitis; peptic versus                            inflammatory from acute pancreatitis. Nonbleeding                            and non-obstructive.                           - Normal second portion of the duodenum. Moderate Sedation:      N/A Recommendation:           - Return patient to hospital ward for ongoing care.                           - Advance diet as  tolerated.                           - Continue present medications. Daily PPI.                           - Await pathology results.                           - Continue management and supportive care of acute                            pancreatitis. Procedure Code(s):        --- Professional ---                           [phone], Esophagogastroduodenoscopy, flexible,                            transoral; with biopsy, single or multiple Diagnosis Code(s):        --- Professional ---                           K44.9, Diaphragmatic hernia without obstruction or                            gangrene                           K29.80, Duodenitis without bleeding                           R10.13, Epigastric pain  R10.12, Left upper quadrant pain CPT copyright 2022 [employer] Medical [employer]. All rights reserved. The codes documented in this report are preliminary and upon coder review may  be revised to meet current compliance requirements. Gordy CHRISTELLA Starch, MD 11/29/2024 12:12:09 PM This report has been signed electronically. Number of Addenda: 0

## 2024-11-29 NOTE — Progress Notes (Signed)
Pt transported to ENDO via wheelchair by transportation staff.

## 2024-11-29 NOTE — Progress Notes (Signed)
 PROGRESS NOTE    Hannah Hale  FMW:[id number] DOB: [birth year] DOA: 11/24/2024 PCP: Sherre Geni LABOR, NP    Brief Narrative:  Hannah Hale is a 58 y.o. female with medical history significant of alcohol abuse, L breast cancer s/p L mastectomy w/ sentinel lymph node biopsy in 2013 followed by chemotherapy/radiation, HTN, DM2, prior LLE DVT, fibroids, and GERD who p/w acute pancreatitis c/b alcohol withdrawal.  Hospital stay complicated by continued pain despite further bowel rest and cramping.  Seen again by GI and plan for EGD   Assessment and Plan: Acute pancreatitis - Diet advanced on 12/14-patient now with increased pain needing IV pain medication - Labs improving -will leave on full liquid diet -EGD pending  Gout - Left big toe - Colchicine  - Will avoid NSAIDs till after EGD  Alcohol withdrawal Alcohol abuse -IV/PO ativan  w/ thiamine /folate per CIWA protocol -Needs complete cessation   Hypokalemia/hypomagnesemia Repleted magnesium  again   HTN -PTA irbesartan -hydrochlorothiazide  150-12.5mg  daily-hold hydrochlorothiazide    Mood disorder Pt PHQ-2 positive and with reported increased stress/anxiety since the death of her husband this past June - Outpatient follow-up with PCP - Question trial of Cymbalta-for pain and depression   DVT prophylaxis: enoxaparin  (LOVENOX ) injection 40 mg Start: 11/30/24 1000    Code Status: Full Code   Disposition Plan:  Level of care: Med-Surg Status is: Inpatient     Consultants:  GI   Subjective: This a.m. developed left large toe pain-similar to her gout in the past  Objective: Vitals:   11/28/24 2128 11/29/24 0526 11/29/24 0843 11/29/24 1012  BP: (!) 155/89 (!) 160/90 (!) 160/90 (!) 167/92  Pulse: 81 83  86  Resp: 17 17  16   Temp: 99.6 F (37.6 C) 98.8 F (37.1 C)  99 F (37.2 C)  TempSrc: Oral Oral  Oral  SpO2: 98% 99%  97%  Weight:      Height:        Intake/Output Summary (Last 24 hours) at  11/29/2024 1048 Last data filed at 11/29/2024 0800 Gross per 24 hour  Intake 530 ml  Output --  Net 530 ml   Filed Weights   11/24/24 1546 11/25/24 0617  Weight: 68 kg 70.1 kg    Examination:   General: Appearance:     Overweight female in no acute distress     Lungs:     respirations unlabored  Heart:    Normal heart rate.    MS:   All extremities are intact.    Neurologic:   Awake, alert, oriented x 3. No apparent focal neurological           defect.        Data Reviewed: I have personally reviewed following labs and imaging studies  CBC: Recent Labs  Lab 11/24/24 1220 11/25/24 0957 11/26/24 0906 11/27/24 0452 11/29/24 0225  WBC 13.0* 13.4* 10.6* 9.6 8.9  NEUTROABS 10.3* 10.4*  --   --   --   HGB 12.9 10.8* 11.0* 10.0* 10.1*  HCT 38.1 31.8* 32.4* 29.8* 29.5*  MCV 92.5 91.4 92.0 93.1 91.9  PLT 219.0 184 205 189 244   Basic Metabolic Panel: Recent Labs  Lab 11/24/24 1645 11/25/24 0957 11/25/24 1546 11/26/24 0906 11/27/24 0452 11/29/24 0225 11/29/24 0410  NA 136 135  --  135 133* 136  --   K 2.9* 3.0*  --  3.8 4.0 3.5  --   CL 103 107  --  105 101 98  --   CO2 19* 19*  --  21* 25 28  --   GLUCOSE 117* 120*  --  121* 105* 119*  --   BUN 9 6  --  5* <5* 6  --   CREATININE 1.08* 0.88  --  0.80 0.77 0.77  --   CALCIUM  8.1* 8.0*  --  8.8* 8.5* 9.2  --   MG  --   --  1.4*  --  1.6*  --  1.8   GFR: Estimated Creatinine Clearance: 74.6 mL/min (by C-G formula based on SCr of 0.77 mg/dL). Liver Function Tests: Recent Labs  Lab 11/24/24 1220 11/25/24 0957 11/27/24 0452 11/29/24 0225  AST 116* 71* 44* 34  ALT 84* 55* 39 32  ALKPHOS 204* 163* 144* 150*  BILITOT 1.4* 1.1 1.2 0.4  PROT 7.0 5.8* 5.6* 5.9*  ALBUMIN 3.4* 2.4* 2.2* 2.9*   Recent Labs  Lab 11/24/24 1220 11/25/24 0957 11/29/24 0225  LIPASE 2,955.0* 441* 664*   No results for input(s): AMMONIA in the last 168 hours. Coagulation Profile: No results for input(s): INR, PROTIME in  the last 168 hours. Cardiac Enzymes: No results for input(s): CKTOTAL, CKMB, CKMBINDEX, TROPONINI in the last 168 hours. BNP (last 3 results) No results for input(s): PROBNP in the last 8760 hours. HbA1C: No results for input(s): HGBA1C in the last 72 hours. CBG: Recent Labs  Lab 11/28/24 1618  GLUCAP 155*   Lipid Profile: No results for input(s): CHOL, HDL, LDLCALC, TRIG, CHOLHDL, LDLDIRECT in the last 72 hours.  Thyroid Function Tests: No results for input(s): TSH, T4TOTAL, FREET4, T3FREE, THYROIDAB in the last 72 hours. Anemia Panel: No results for input(s): VITAMINB12, FOLATE, FERRITIN, TIBC, IRON, RETICCTPCT in the last 72 hours. Sepsis Labs: No results for input(s): PROCALCITON, LATICACIDVEN in the last 168 hours.  No results found for this or any previous visit (from the past 240 hours).       Radiology Studies: No results found.       Scheduled Meds:  [MAR Hold] amLODipine   5 mg Oral Daily   [MAR Hold] atorvastatin   20 mg Oral Daily   [MAR Hold] colchicine   1.2 mg Oral Once   Followed by   [MAR Hold] colchicine   0.6 mg Oral Once   [MAR Hold] enoxaparin  (LOVENOX ) injection  40 mg Subcutaneous Daily   [MAR Hold] irbesartan   300 mg Oral Daily   [MAR Hold] pantoprazole   40 mg Oral BID   [MAR Hold] polyethylene glycol  17 g Oral BID   [MAR Hold] senna  1 tablet Oral BID   [MAR Hold] thiamine   100 mg Oral Daily   Or   [MAR Hold] thiamine   100 mg Intravenous Daily   Continuous Infusions:     LOS: 4 days    Time spent: 45 minutes spent on chart review, discussion with nursing staff, consultants, updating family and interview/physical exam; more than 50% of that time was spent in counseling and/or coordination of care.    Hannah RAYMOND Bowl, DO [facility] Available via Epic secure chat 7am-7pm After these hours, please refer to coverage provider listed on [email or web address] 11/29/2024, 10:48 AM

## 2024-11-29 NOTE — Plan of Care (Signed)

## 2024-11-29 NOTE — Anesthesia Postprocedure Evaluation (Signed)
 Anesthesia Post Note  Patient: WILLYE JAVIER  Procedure(s) Performed: EGD (ESOPHAGOGASTRODUODENOSCOPY) BIOPSY, SKIN, SUBCUTANEOUS TISSUE, OR MUCOUS MEMBRANE     Patient location during evaluation: PACU Anesthesia Type: MAC Level of consciousness: awake and alert and oriented Pain management: pain level controlled Vital Signs Assessment: post-procedure vital signs reviewed and stable Respiratory status: spontaneous breathing, nonlabored ventilation and respiratory function stable Cardiovascular status: stable and blood pressure returned to baseline Postop Assessment: no apparent nausea or vomiting Anesthetic complications: no   No notable events documented.  Last Vitals:  Vitals:   11/29/24 1048 11/29/24 1210  BP: (!) 175/103 (!) 139/94  Pulse: 90 (!) 112  Resp: 14 16  Temp: 36.6 C (!) 36.2 C  SpO2: 97% 99%    Last Pain:  Vitals:   11/29/24 1210  TempSrc: Temporal  PainSc: 0-No pain                 Shaliyah Taite A.

## 2024-11-29 NOTE — Transfer of Care (Signed)
 Immediate Anesthesia Transfer of Care Note  Patient: Hannah Hale  Procedure(s) Performed: EGD (ESOPHAGOGASTRODUODENOSCOPY) BIOPSY, SKIN, SUBCUTANEOUS TISSUE, OR MUCOUS MEMBRANE  Patient Location: PACU  Anesthesia Type:MAC  Level of Consciousness: awake, alert , and drowsy  Airway & Oxygen Therapy: Patient Spontanous Breathing and Patient connected to nasal cannula oxygen  Post-op Assessment: Report given to RN and Post -op Vital signs reviewed and stable  Post vital signs: Reviewed and stable  Last Vitals:  Vitals Value Taken Time  BP 139/94 11/29/24 12:10  Temp    Pulse 108 11/29/24 12:11  Resp 20 11/29/24 12:11  SpO2 100 % 11/29/24 12:11  Vitals shown include unfiled device data.  Last Pain:  Vitals:   11/29/24 1210  TempSrc:   PainSc: 0-No pain      Patients Stated Pain Goal: 2 (11/27/24 0511)  Complications: No notable events documented.

## 2024-11-30 ENCOUNTER — Other Ambulatory Visit (HOSPITAL_COMMUNITY): Payer: Self-pay

## 2024-11-30 LAB — SURGICAL PATHOLOGY

## 2024-11-30 MED ORDER — SENNA 8.6 MG PO TABS: 1.0000 | ORAL_TABLET | Freq: Two times a day (BID) | ORAL | Status: DC

## 2024-11-30 MED ORDER — ATORVASTATIN CALCIUM 20 MG PO TABS
20.0000 mg | ORAL_TABLET | Freq: Every day | ORAL | 0 refills | Status: AC
  Filled 2024-11-30: qty 30, 30d supply, fill #0

## 2024-11-30 MED ORDER — POLYETHYLENE GLYCOL 3350 17 G PO PACK: 17.0000 g | PACK | Freq: Two times a day (BID) | ORAL | Status: DC

## 2024-11-30 MED ORDER — COLCHICINE 0.6 MG PO TABS
0.6000 mg | ORAL_TABLET | Freq: Every day | ORAL | 0 refills | Status: AC
  Filled 2024-11-30: qty 5, 5d supply, fill #0

## 2024-11-30 MED ORDER — IRBESARTAN 300 MG PO TABS
300.0000 mg | ORAL_TABLET | Freq: Every day | ORAL | 0 refills | Status: AC
  Filled 2024-11-30: qty 30, 30d supply, fill #0

## 2024-11-30 MED ORDER — OXYCODONE HCL 5 MG PO TABS
5.0000 mg | ORAL_TABLET | ORAL | 0 refills | Status: AC | PRN
  Filled 2024-11-30: qty 12, 2d supply, fill #0

## 2024-11-30 MED ORDER — PANTOPRAZOLE SODIUM 40 MG PO TBEC
40.0000 mg | DELAYED_RELEASE_TABLET | Freq: Every day | ORAL | 0 refills | Status: DC
  Filled 2024-11-30: qty 30, 30d supply, fill #0

## 2024-11-30 MED ORDER — AMLODIPINE BESYLATE 5 MG PO TABS
5.0000 mg | ORAL_TABLET | Freq: Every day | ORAL | 0 refills | Status: AC
  Filled 2024-11-30: qty 30, 30d supply, fill #0

## 2024-11-30 MED ADMIN — Colchicine Tab 0.6 MG: 0.3 mg | ORAL | @ 11:00:00 | NDC 99999010024

## 2024-11-30 MED FILL — Colchicine Tab 0.6 MG: 0.3000 mg | ORAL | Qty: 1 | Status: AC

## 2024-11-30 NOTE — Plan of Care (Signed)
  Problem: Health Behavior/Discharge Planning: Goal: Ability to manage health-related needs will improve Outcome: Progressing   Problem: Clinical Measurements: Goal: Diagnostic test results will improve Outcome: Progressing Goal: Cardiovascular complication will be avoided Outcome: Progressing   

## 2024-11-30 NOTE — Discharge Summary (Signed)
 Physician Discharge Summary  Hannah Hale FMW:[id number] DOB: January 22, 1966 DOA: 11/24/2024  PCP: Sherre Geni LABOR, NP  Admit date: 11/24/2024 Discharge date: 11/30/2024  Admitted From:  Discharge disposition: Home   Recommendations for Outpatient Follow-Up:   PPI daily Close GI follow-up outpatient Monitor blood pressure and adjust as needed   Discharge Diagnosis:   Principal Problem:   Acute alcoholic pancreatitis Active Problems:   Acute pancreatitis   Elevated liver enzymes   Abdominal pain, epigastric   LUQ pain   Duodenitis   Hiatal hernia    Discharge Condition: Improved.  Diet recommendation: Small frequent meals for her hiatal hernia  Wound care: None.  Code status: Full.   History of Present Illness:   Hannah Hale is a 58 y.o. female with medical history significant of alcohol abuse, L breast cancer s/p L mastectomy w/ sentinel lymph node biopsy in 2013 followed by chemotherapy/radiation, HTN, DM2, prior LLE DVT, fibroids, and GERD who p/w acute pancreatitis c/b alcohol withdrawal.   The patient reported experiencing persistent abdominal pain for the past three days. Her sx began on 12/10 when she presented to the ED, and her w/u at the time was unyielding; so she returned home. Upon returning home, the following morning her sx reappeared after consuming a slice of toast and an egg, which did not stay down. The patient also attempted to eat soup and drink Coca Cola that afternoon, but those did not stay down either. The following day, the patient drank ginger ale and soups, but these too were vomited. Given her recurrent emesis and inability to tol PO, pt returned to the ED for evaluation.   In the ED, pt hypertensive and tachycadic. Labs notable for K 2.9, lipase 2955, and WBC 13. CTA w/ contrast c/w acute pancreatitis. EDP started CIWA and requested medicine admission.    Hospital Course by Problem:   Acute pancreatitis - Diet advanced  on 12/14-patient now with increased pain needing IV pain medication - Labs improving -will leave on full liquid diet -EGD- Acute bulbar duodenitis; peptic versus  inflammatory from acute pancreatitis.  No hernia   Gout - Left big toe - Colchicine -improved - Limit NSAIDs   Alcohol withdrawal Alcohol abuse -IV/PO ativan  w/ thiamine /folate per CIWA protocol -Needs complete cessation   Hypokalemia/hypomagnesemia Repleted magnesium  again   HTN - Norvasc /ARB   Mood disorder Pt PHQ-2 positive and with reported increased stress/anxiety since the death of her husband this past June - Outpatient follow-up with PCP - Question trial of Cymbalta-for pain and depression      Medical Consultants:      Discharge Exam:   Vitals:   11/30/24 0536 11/30/24 0949  BP: (!) 145/77 135/81  Pulse: 77 74  Resp: 16 17  Temp: 97.9 F (36.6 C) 98.2 F (36.8 C)  SpO2: 95% 95%   Vitals:   11/29/24 1811 11/29/24 2118 11/30/24 0536 11/30/24 0949  BP: (!) 157/88 (!) 153/87 (!) 145/77 135/81  Pulse: 92 83 77 74  Resp:  16 16 17   Temp: 98.6 F (37 C) 98.8 F (37.1 C) 97.9 F (36.6 C) 98.2 F (36.8 C)  TempSrc: Oral Oral Oral Oral  SpO2: 100% 98% 95% 95%  Weight:      Height:        General exam: Appears calm and comfortable.    The results of significant diagnostics from this hospitalization (including imaging, microbiology, ancillary and laboratory) are listed below for reference.  Procedures and Diagnostic Studies:   CT ABDOMEN PELVIS W CONTRAST Result Date: 11/24/2024 EXAM: CT ABDOMEN AND PELVIS WITH CONTRAST 11/22/2024 TECHNIQUE: CT of the abdomen and pelvis was performed with the administration of 100 mL of iohexol  (OMNIPAQUE ) 300 MG/ML solution. Multiplanar reformatted images are provided for review. Automated exposure control, iterative reconstruction, and/or weight-based adjustment of the mA/kV was utilized to reduce the radiation dose to as low as reasonably achievable.  COMPARISON: None available. CLINICAL HISTORY: Pancreatitis, acute, severe FINDINGS: LOWER CHEST: No acute abnormality. LIVER: There is a 2.8 x 1.9 cm hypodense area with some peripheral enhancing puddling on delayed imaging favoring hemangioma. This is unchanged from prior. There is diffuse fatty infiltration of the liver, unchanged. GALLBLADDER AND BILE DUCTS: Gallbladder and bile ducts are within normal limits. SPLEEN: No acute abnormality. PANCREAS: There is mild diffuse inflammatory stranding surrounding the entire pancreas. No pancreatic ductal dilatation or enhancing fluid collection. Pancreas enhances normally. ADRENAL GLANDS: No acute abnormality. KIDNEYS, URETERS AND BLADDER: There is a 7 mm calculus in the inferior pole of the left kidney. The kidneys are otherwise within normal limits. No hydronephrosis. No perinephric or periureteral stranding. Urinary bladder is unremarkable. GI AND BOWEL: There is a moderate sized hiatal hernia containing the proximal stomach. The appendix appears normal. There is no bowel obstruction. PERITONEUM AND RETROPERITONEUM: There is a small amount of free fluid in the pelvis and bilateral upper quadrants. No free air. VASCULATURE: Aorta is normal in caliber. There are atherosclerotic calcifications of the aorta. LYMPH NODES: No lymphadenopathy. REPRODUCTIVE ORGANS: The uterus is enlarged and lobulated containing numerous fibroids measuring up to 5.9 cm. Ovaries are not well delineated on this study. BONES AND SOFT TISSUES: No acute osseous abnormality. No focal soft tissue abnormality. IMPRESSION: 1. Mild diffuse inflammatory stranding surrounding the pancreas, consistent with acute pancreatitis. No pancreatic ductal dilatation or enhancing fluid collection. Normal pancreatic enhancement. 2. Small amount of free fluid in the abdomen and pelvis. 3. Moderate-sized hiatal hernia containing the proximal stomach. 4. Enlarged, lobulated uterus with multiple fibroids measuring up to  5.9 cm. Ovaries are not well delineated on this study. Electronically signed by: Greig Pique MD 11/24/2024 07:04 PM EST RP Workstation: [id number]     Labs:   Basic Metabolic Panel: Recent Labs  Lab 11/24/24 1645 11/25/24 0957 11/25/24 1546 11/26/24 0906 11/27/24 0452 11/29/24 0225 11/29/24 0410  NA 136 135  --  135 133* 136  --   K 2.9* 3.0*  --  3.8 4.0 3.5  --   CL 103 107  --  105 101 98  --   CO2 19* 19*  --  21* 25 28  --   GLUCOSE 117* 120*  --  121* 105* 119*  --   BUN 9 6  --  5* <5* 6  --   CREATININE 1.08* 0.88  --  0.80 0.77 0.77  --   CALCIUM  8.1* 8.0*  --  8.8* 8.5* 9.2  --   MG  --   --  1.4*  --  1.6*  --  1.8   GFR Estimated Creatinine Clearance: 74.6 mL/min (by C-G formula based on SCr of 0.77 mg/dL). Liver Function Tests: Recent Labs  Lab 11/24/24 1220 11/25/24 0957 11/27/24 0452 11/29/24 0225  AST 116* 71* 44* 34  ALT 84* 55* 39 32  ALKPHOS 204* 163* 144* 150*  BILITOT 1.4* 1.1 1.2 0.4  PROT 7.0 5.8* 5.6* 5.9*  ALBUMIN 3.4* 2.4* 2.2* 2.9*   Recent Labs  Lab  11/24/24 1220 11/25/24 0957 11/29/24 0225  LIPASE 2,955.0* 441* 664*   No results for input(s): AMMONIA in the last 168 hours. Coagulation profile No results for input(s): INR, PROTIME in the last 168 hours.  CBC: Recent Labs  Lab 11/24/24 1220 11/25/24 0957 11/26/24 0906 11/27/24 0452 11/29/24 0225  WBC 13.0* 13.4* 10.6* 9.6 8.9  NEUTROABS 10.3* 10.4*  --   --   --   HGB 12.9 10.8* 11.0* 10.0* 10.1*  HCT 38.1 31.8* 32.4* 29.8* 29.5*  MCV 92.5 91.4 92.0 93.1 91.9  PLT 219.0 184 205 189 244   Cardiac Enzymes: No results for input(s): CKTOTAL, CKMB, CKMBINDEX, TROPONINI in the last 168 hours. BNP: Invalid input(s): POCBNP CBG: Recent Labs  Lab 11/28/24 1618  GLUCAP 155*   D-Dimer No results for input(s): DDIMER in the last 72 hours. Hgb A1c No results for input(s): HGBA1C in the last 72 hours. Lipid Profile No results for input(s): CHOL,  HDL, LDLCALC, TRIG, CHOLHDL, LDLDIRECT in the last 72 hours. Thyroid function studies No results for input(s): TSH, T4TOTAL, T3FREE, THYROIDAB in the last 72 hours.  Invalid input(s): FREET3 Anemia work up No results for input(s): VITAMINB12, FOLATE, FERRITIN, TIBC, IRON, RETICCTPCT in the last 72 hours. Microbiology No results found for this or any previous visit (from the past 240 hours).   Discharge Instructions:   Discharge Instructions     Call MD for:  persistant nausea and vomiting   Complete by: As directed    Call MD for:  temperature >100.4   Complete by: As directed    Discharge instructions   Complete by: As directed    See attached information about diet Bowel regimen to avoid constipation Do not drink alcohol   Increase activity slowly   Complete by: As directed       Allergies as of 11/30/2024   No Known Allergies      Medication List     STOP taking these medications    ibuprofen 200 MG tablet Commonly known as: ADVIL   traMADol  50 MG tablet Commonly known as: ULTRAM        TAKE these medications    amLODipine  5 MG tablet Commonly known as: NORVASC  Take 1 tablet (5 mg total) by mouth daily.   atorvastatin  20 MG tablet Commonly known as: LIPITOR Take 1 tablet (20 mg total) by mouth daily. What changed: See the new instructions.   colchicine  0.6 MG tablet Take 1 tablet (0.6 mg total) by mouth daily.   irbesartan  300 MG tablet Commonly known as: AVAPRO  Take 1 tablet (300 mg total) by mouth daily.   oxyCODONE  5 MG immediate release tablet Commonly known as: Oxy IR/ROXICODONE  Take 1 tablet (5 mg total) by mouth every 4 (four) hours as needed for moderate pain (pain score 4-6).   pantoprazole  40 MG tablet Commonly known as: PROTONIX  Take 1 tablet (40 mg total) by mouth daily.   polyethylene glycol 17 g packet Commonly known as: MIRALAX  / GLYCOLAX  Take 17 g by mouth 2 (two) times daily.   senna 8.6  MG Tabs tablet Commonly known as: SENOKOT Take 1 tablet (8.6 mg total) by mouth 2 (two) times daily.        Follow-up Information     Cox, Geni A, NP Follow up in 1 week(s).   Specialty: Family Medicine Why: or establish with NEW PCP Contact information: [address] [place] [address] [phone]         Leigh Elspeth SQUIBB, MD Follow  up.   Specialty: Gastroenterology Contact information: [address] [place] [address] [place] [address] [phone]                  Time coordinating discharge: 45 minutes  Signed:  Harlene RAYMOND Bowl DO  [facility] 11/30/2024, 9:54 AM

## 2024-11-30 NOTE — Plan of Care (Signed)
   Problem: Education: Goal: Knowledge of General Education information will improve Description Including pain rating scale, medication(s)/side effects and non-pharmacologic comfort measures Outcome: Progressing

## 2024-11-30 NOTE — Progress Notes (Signed)
°   11/30/24 0952  TOC Brief Assessment  Insurance and Status Reviewed  Patient has primary care physician  (see note)  Home environment has been reviewed home  Prior level of function: independent  Prior/Current Home Services No current home services  Social Drivers of Health Review SDOH reviewed no interventions necessary  Transition of care needs no transition of care needs at this time     Spoke to patient . Patient states she does not have a PCP . Have Cox listed in system. Patient states Cox was at [facility] but not sure if she is still there. Asking how to find a new PCP. NCM explained she can  call the number on her INS card , or go to insurance website and get a list of PCP in network and pick one . Patient voiced understanding and will do same.

## 2024-12-01 ENCOUNTER — Encounter (HOSPITAL_COMMUNITY): Payer: Self-pay | Admitting: Internal Medicine

## 2024-12-04 ENCOUNTER — Telehealth: Payer: Self-pay | Admitting: Internal Medicine

## 2024-12-04 ENCOUNTER — Ambulatory Visit: Payer: Self-pay | Admitting: Internal Medicine

## 2024-12-04 NOTE — Telephone Encounter (Signed)
 Yes that is fine  Thanks

## 2024-12-04 NOTE — Telephone Encounter (Signed)
 Work clearance depends on what she does for work and if she feels comfortable to return or not. If she wants to work and feels she can handle it she can do so. If her work is strenous and she needs more time to recover then reasonable to miss a few days while she recovers.

## 2024-12-04 NOTE — Telephone Encounter (Signed)
 Dr Leigh, please advise.... Patient recently seen in the hospital for alcohol induced pancreatitis. She has questions regarding when she can go back to work following her hospitalization.

## 2024-12-04 NOTE — Telephone Encounter (Signed)
 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 States she would like to be off until the beginning of the year and says Dr Albertus told her this would be appropriate. Patient requests a work note until beginning of the year. Can we provide that for her?

## 2024-12-04 NOTE — Telephone Encounter (Signed)
 PT is calling to discuss food she can/cannot eat. She also needs to know if doctor wants her to start work at the beginning of the year or should she wait to go back to work after her next follow up appointment. She is asking to have both her requests in writing. Please advise.

## 2024-12-05 NOTE — Telephone Encounter (Signed)
 Patient is advised that Dr Leigh has given permission to write a letter for work with a return 12/2024. Patient advised letter has been placed at our 3rd floor front desk for her to pick up at her convenience. Additionally, patient asks for a diet for pancreatitis. Pancreatitis eating plan has been attached to the letter at the front desk for her review.

## 2024-12-24 NOTE — Progress Notes (Unsigned)
 "     Hannah Console, PA-C [address] [place], [place]  [address] Phone: [phone]   Gastroenterology Consultation  Referring Provider:     Sherre Geni LABOR, NP Primary Care Physician:  Sherre Geni LABOR, NP Primary Gastroenterologist:  Hannah Console, PA-C / Elspeth Naval, MD  Reason for Consultation:     Upper Abdominal Pain, Hospital follow-up alcohol pancreatitis        HPI:   Discussed the use of AI scribe software for clinical note transcription with the patient, who gave verbal consent to proceed.  New patient.  Here for hospital follow-up of acute alcoholic pancreatitis.  Evaluate upper abdominal pain.  She was hospitalized 11/24/2024 until 11/30/2024 for acute alcoholic pancreatitis, severe upper abdominal pain, elevated LFTs, elevated lipase, hypomagnesemia, hypokalemia.  Had alcohol withdrawal.  EGD showed acute bulbar duodenitis; peptic versus  inflammatory from acute pancreatitis.  No hernia.  Biopsy negative for H. pylori.  PMH: Hx breast cancer (2023), DVT, GERD, pancreatitis, alcohol use disorder, type 2 diabetes, HTN.  Component Ref Range & Units (hover) 3 wk ago (11/29/24) 1 mo ago (11/25/24) 1 mo ago (11/24/24) 1 mo ago (11/22/24)  Lipase 664 High  441 High  CM 2,955.0 High  R 136 High  CM      Component Ref Range & Units (hover) 1 mo ago  Triglycerides 74   History of Present Illness Hannah Hale is a 59 year old female with recent alcohol-induced acute pancreatitis who presents for follow-up after hospitalization.  Acute Pancreatitis and Abdominal Symptoms: - Hospitalized from December 12th to December 18th for acute pancreatitis attributed to alcohol use - Prior to admission: experienced abdominal pain, nausea, and vomiting - Endoscopy with gastric biopsies for H. pylori was negative - Imaging revealed a large hiatal hernia and a small kidney stone; no gallstones or pancreatic cysts identified - Currently abstaining from alcohol since  hospitalization - Currently taking pantoprazole  40 mg daily - No current abdominal pain, nausea, vomiting, rectal bleeding, or melena - Ongoing constipation - No symptoms of acid reflux or heartburn - She is currently feeling a lot better.  No GI symptoms. - She is requesting paperwork completed for FMLA and short-term disability.   - She was absent from work starting 11/24/2024 and returned to work 12/18/2024.  Thank you for kindest sign she is to acute stay there.8 1 list it was a colleague and at the end that Hannah Hannah will last  Anemia: - Low hemoglobin identified during hospitalization - No current abnormal bleeding or bruising - No history of anemia prior to hospitalization - No blood transfusions required  Psychosocial Stressors and Functional Status: - Missed work from December 12th to January 5th due to illness - Completing FMLA and short-term disability paperwork - Increased stress related to paperwork and recent loss of her husband in June - Works second shift.  She works at [employer] in [place]. - Previously consumed miniature bottles of alcohol at home but has not resumed alcohol use since hospitalization - Declines need for support resources for alcohol cessatio.  11/29/2024 EGD: 7 cm hiatal hernia.  Normal esophagus mucosa.  Normal stomach.  Moderate duodenitis.  Biopsies negative for H. pylori.  11/24/2024 CT abdomen pelvis with contrast:  1. Mild diffuse inflammatory stranding surrounding the pancreas, consistent with acute pancreatitis. No pancreatic ductal dilatation or enhancing fluid collection. Normal pancreatic enhancement. 2. Small amount of free fluid in the abdomen and pelvis. 3. Moderate-sized hiatal hernia containing the proximal stomach. 4. Enlarged,  lobulated uterus with multiple fibroids measuring up to 5.9 cm.   11/2020 colonoscopy: Large internal hemorrhoids.  Otherwise normal.  No polyps.  10-year repeat (11/2030).  Past Medical History:  Diagnosis Date    Breast cancer (HCC)    left, Sept 20 2013   Clotting disorder 2013   L calf   Coughing    cold recent    Diabetes mellitus without complication (HCC)    on meds   Fibroids    uterine   GERD (gastroesophageal reflux disease)    with certain foods   Hypertension    on meds   Personal history of chemotherapy    2013/2014   Personal history of radiation therapy    2014    Past Surgical History:  Procedure Laterality Date   AXILLARY LYMPH NODE DISSECTION  12/08/2012   Procedure: AXILLARY LYMPH NODE DISSECTION;  Surgeon: Alm VEAR Angle, MD;  Location: [facility];  Service: General;  Laterality: Left;   BIOPSY OF SKIN SUBCUTANEOUS TISSUE AND/OR MUCOUS MEMBRANE  11/29/2024   Procedure: BIOPSY, SKIN, SUBCUTANEOUS TISSUE, OR MUCOUS MEMBRANE;  Surgeon: Albertus Gordy HERO, MD;  Location: [facility];  Service: Gastroenterology;;   ESOPHAGOGASTRODUODENOSCOPY N/A 11/29/2024   Procedure: EGD (ESOPHAGOGASTRODUODENOSCOPY);  Surgeon: Albertus Gordy HERO, MD;  Location[facility];  Service: Gastroenterology;  Laterality: N/A;   MASTECTOMY Left    PORTACATH PLACEMENT  12/08/2012   Procedure: INSERTION PORT-A-CATH;  Surgeon: Alm VEAR Angle, MD;  Location: [facility];  Service: General;  Laterality: N/A;  Power Port Placment and Left Axillary Node Dissection   SIMPLE MASTECTOMY W/ SENTINEL NODE BIOPSY  11/03/2012   SIMPLE MASTECTOMY WITH AXILLARY SENTINEL NODE BIOPSY  11/03/2012   Procedure: SIMPLE MASTECTOMY WITH AXILLARY SENTINEL NODE BIOPSY;  Surgeon: Alm VEAR Angle, MD;  Location: [facility] OR;  Service: General;  Laterality: Left;   WISDOM TOOTH EXTRACTION      Prior to Admission medications  Medication Sig Start Date End Date Taking? Authorizing Provider  amLODipine  (NORVASC ) 5 MG tablet Take 1 tablet (5 mg total) by mouth daily. 11/30/24   Vann, Jessica U, DO  atorvastatin  (LIPITOR) 20 MG tablet Take 1 tablet (20 mg total) by mouth daily. 11/30/24   Vann, Jessica U, DO  colchicine  0.6 MG tablet Take 1 tablet  (0.6 mg total) by mouth daily. 11/30/24   Vann, Jessica U, DO  irbesartan  (AVAPRO ) 300 MG tablet Take 1 tablet (300 mg total) by mouth daily. 11/30/24   Vann, Jessica U, DO  oxyCODONE  (OXY IR/ROXICODONE ) 5 MG immediate release tablet Take 1 tablet (5 mg total) by mouth every 4 (four) hours as needed for moderate pain (pain score 4-6). 11/30/24   Vann, Jessica U, DO  pantoprazole  (PROTONIX ) 40 MG tablet Take 1 tablet (40 mg total) by mouth daily. 11/30/24   Vann, Jessica U, DO  polyethylene glycol (MIRALAX  / GLYCOLAX ) 17 g packet Take 17 g by mouth 2 (two) times daily. 11/30/24   Vann, Jessica U, DO  senna (SENOKOT) 8.6 MG TABS tablet Take 1 tablet (8.6 mg total) by mouth 2 (two) times daily. 11/30/24   Vann, Jessica U, DO    Family History  Problem Relation Age of Onset   Lung cancer Maternal Grandfather    Diabetes Mother    Prostate cancer Father 40   Colon polyps Father 26   Stroke Maternal Grandmother    Lung cancer Cousin        non smoker, died in his 34s; paternal cousin   Lung  cancer Cousin        father's maternal cousin; smoker   Stroke Paternal Grandfather    Colon cancer Neg Hx    Esophageal cancer Neg Hx    Stomach cancer Neg Hx    Rectal cancer Neg Hx      Social History[1]  Allergies as of 12/25/2024   (No Known Allergies)    Review of Systems:    All systems reviewed and negative except where noted in HPI.   Physical Exam:  BP 118/72   Pulse (!) 124   Ht 5' 4 (1.626 m)   Wt 145 lb (65.8 kg)   LMP 11/28/2012   BMI 24.89 kg/m  Patient's last menstrual period was 11/28/2012.  General:   Alert,  Well-developed, well-nourished, pleasant and cooperative in NAD Lungs:  Respirations even and unlabored.  Clear throughout to auscultation.   No wheezes, crackles, or rhonchi. No acute distress. Heart:  Regular rate and rhythm; no murmurs, clicks, rubs, or gallops. Abdomen:  Normal bowel sounds.  No bruits.  Soft, and non-distended without masses,  hepatosplenomegaly or hernias noted.  No Tenderness.  No guarding or rebound tenderness.    Neurologic:  Alert and oriented x3;  grossly normal neurologically. Psych:  Alert and cooperative.  Depressed mood and affect.   Imaging Studies: No results found.  Labs: CBC    Component Value Date/Time   WBC 8.9 11/29/2024 0225   RBC 3.21 (L) 11/29/2024 0225   HGB 10.1 (L) 11/29/2024 0225   HGB 13.6 03/28/2015 0832   HCT 29.5 (L) 11/29/2024 0225   HCT 41.5 03/28/2015 0832   PLT 244 11/29/2024 0225   PLT 265 03/28/2015 0832   MCV 91.9 11/29/2024 0225   MCV 88.5 03/28/2015 0832   Component Ref Range & Units (hover) 3 wk ago (11/29/24) 4 wk ago (11/27/24) 4 wk ago (11/26/24) 1 mo ago (11/25/24) 1 mo ago (11/24/24) 1 mo ago (11/22/24) 9 yr ago (03/28/15)  WBC 8.9 9.6 10.6 High  13.4 High  13.0 High  7.4 7.8 R  RBC 3.21 Low  3.20 Low  3.52 Low  3.48 Low  4.12 R 4.25 4.69 R  Hemoglobin 10.1 Low  10.0 Low  11.0 Low  10.8 Low  12.9 13.4 13.6 R  HCT 29.5 Low  29.8 Low  32.4 Low  31.8 Low  38.1 38.2 41.5 R  MCV 91.9 93.1 92.0 91.4 92.5 R 89.9 88.5 R  MCH 31.5 31.3 31.3 31.0  31.5 28.9 R  MCHC 34.2 33.6 34.0 34.0 33.9 35.1 32.7 R  RDW 13.7 14.3 14.6 14.3 14.5 13.7 14.9 High  R  Platelets 244 189 205 184 219.0      CMP     Component Value Date/Time   NA 136 11/29/2024 0225   NA 142 03/28/2015 0832   K 3.5 11/29/2024 0225   K 3.8 03/28/2015 0832   CL 98 11/29/2024 0225   CL 105 05/26/2013 0828   CO2 28 11/29/2024 0225   CO2 24 03/28/2015 0832   GLUCOSE 119 (H) 11/29/2024 0225   GLUCOSE 133 03/28/2015 0832   GLUCOSE 128 (H) 05/26/2013 0828   BUN 6 11/29/2024 0225   BUN 6.1 (L) 03/28/2015 0832   CREATININE 0.77 11/29/2024 0225   CREATININE 0.7 03/28/2015 0832   CALCIUM  9.2 11/29/2024 0225   CALCIUM  8.9 03/28/2015 0832   PROT 5.9 (L) 11/29/2024 0225   PROT 7.1 03/28/2015 0832   ALBUMIN 2.9 (L) 11/29/2024 0225   ALBUMIN 3.4 (L)  03/28/2015 0832   AST 34 11/29/2024 0225   AST  20 03/28/2015 0832   ALT 32 11/29/2024 0225   ALT 16 03/28/2015 0832   ALKPHOS 150 (H) 11/29/2024 0225   ALKPHOS 140 03/28/2015 0832   BILITOT 0.4 11/29/2024 0225   BILITOT 0.32 03/28/2015 0832   GFRNONAA >60 11/29/2024 0225   GFRAA >90 11/04/2012 0637   Component Ref Range & Units (hover) 3 wk ago (11/29/24) 4 wk ago (11/27/24) 4 wk ago (11/26/24) 1 mo ago (11/25/24) 1 mo ago (11/24/24) 1 mo ago (11/24/24) 1 mo ago (11/22/24)  Sodium 136 133 Low  135 135 136  135  Potassium 3.5 4.0 3.8 3.0 Low  2.9 Low   3.9  Chloride 98 101 105 107 103  99  CO2 28 25 21  Low  19 Low  19 Low   19 Low   Glucose, Bld 119 High  105 High  CM 121 High  CM 120 High  CM 117 High  CM  163 High  CM  Comment: Glucose reference range applies only to samples taken after fasting for at least 8 hours.  BUN 6 <5 Low  5 Low  6 9  19   Creatinine, Ser 0.77 0.77 0.80 0.88 1.08 High   0.97  Calcium  9.2 8.5 Low  8.8 Low  8.0 Low  8.1 Low   9.0  Total Protein 5.9 Low  5.6 Low   5.8 Low   7.0 R 6.7  Albumin 2.9 Low  2.2 Low   2.4 Low   3.4 Low  R 3.3 Low   AST 34 44 High   71 High   116 High  R 335 High  CM  ALT 32 39  55 High   84 High  R 178 High   Alkaline Phosphatase 150 High  144 High   163 High   204 High  R 297 High   Total Bilirubin 0.4 1.2  1.1  1.4 High  R 0.6  GFR, Estimated >60 >60 CM >60 CM >60 CM 60 Low  CM  >60 CM   Component Ref Range & Units (hover) 4 wk ago  Hepatitis B Surface Ag NON REACTIVE  HCV Ab NON REACTIVE  Comment: (NOTE) Nonreactive HCV antibody screen is consistent with no HCV infections, unless recent infection is suspected or other evidence exists to indicate HCV infection.   Hep A IgM NON REACTIVE  Hep B C IgM NON REACTIVE    Assessment and Plan:   Hannah Hale is a 59 y.o. y/o female has been referred for hospital follow-up of:  Alcohol Induced Pancreatitis Elevated LFTs Anemia Large Hiatal Hernia Upper Abdominal Pain Duodenitis / GERD Alcohol Use  Disorder Hypomagenesemia / Hypokalemia  She is currently feeling a lot better posthospitalization.  She has no more upper abdominal pain, nausea, vomiting, or any other GI symptoms at present.  Plan: - Labs: Lipase, Hepatic Panal, BMP, Magneisium - Labs: CBC, iron panel, ferritin, B12, folate, and celiac lab.  - Continue PPI - Pantoprazole  40mg  daily - Alcohol cessation resources discussed - She is encouraged to continue to abstain from all alcohol use. - Encouraged tobacco cessation. Assessment & Plan Alcohol-induced acute pancreatitis - Provided positive reinforcement for continued alcohol abstinence. - Offered alcohol cessation support resources, which she declined. - Ordered repeat lipase to assess for normalization of pancreatic enzymes.  Anemia Low hemoglobin during recent hospitalization; etiology unclear, possibly multifactorial. Currently asymptomatic without evidence of gastrointestinal bleeding. - Ordered repeat hemoglobin. -  Ordered vitamin B12, folate, and iron studies to evaluate for nutritional deficiencies. - Discussed potential repeat colonoscopy if iron is low; initiate supplementation if B12 or folate is low.  Elevated liver enzymes Previously elevated liver enzymes likely secondary to alcohol use. - Ordered repeat liver function tests to assess for normalization.  Hiatal hernia with gastroesophageal reflux Large hiatal hernia on imaging; gastroesophageal reflux well controlled on pantoprazole . - Continue pantoprazole  for acid suppression and gastric protection.  Follow up based on lab results and GI symptoms.  Hannah Console, PA-C       [1]  Social History Tobacco Use   Smoking status: Every Day    Current packs/day: 0.25    Average packs/day: 0.3 packs/day for 29.0 years (7.3 ttl pk-yrs)    Types: Cigarettes   Smokeless tobacco: Never  Vaping Use   Vaping status: Never Used  Substance Use Topics   Alcohol use: Not Currently    Alcohol/week: 15.0  standard drinks of alcohol    Types: 3 Glasses of wine, 12 Cans of beer per week    Comment: no alcohol since the hospital   Drug use: No   "

## 2024-12-25 ENCOUNTER — Other Ambulatory Visit

## 2024-12-25 ENCOUNTER — Encounter: Payer: Self-pay | Admitting: Physician Assistant

## 2024-12-25 ENCOUNTER — Ambulatory Visit: Admitting: Physician Assistant

## 2024-12-25 VITALS — BP 118/72 | HR 124 | Ht 64.0 in | Wt 145.0 lb

## 2024-12-25 DIAGNOSIS — R748 Abnormal levels of other serum enzymes: Secondary | ICD-10-CM

## 2024-12-25 DIAGNOSIS — E876 Hypokalemia: Secondary | ICD-10-CM | POA: Diagnosis not present

## 2024-12-25 DIAGNOSIS — R7989 Other specified abnormal findings of blood chemistry: Secondary | ICD-10-CM

## 2024-12-25 DIAGNOSIS — F1011 Alcohol abuse, in remission: Secondary | ICD-10-CM | POA: Diagnosis not present

## 2024-12-25 DIAGNOSIS — K852 Alcohol induced acute pancreatitis without necrosis or infection: Secondary | ICD-10-CM

## 2024-12-25 DIAGNOSIS — D649 Anemia, unspecified: Secondary | ICD-10-CM | POA: Diagnosis not present

## 2024-12-25 DIAGNOSIS — K219 Gastro-esophageal reflux disease without esophagitis: Secondary | ICD-10-CM | POA: Diagnosis not present

## 2024-12-25 DIAGNOSIS — K449 Diaphragmatic hernia without obstruction or gangrene: Secondary | ICD-10-CM

## 2024-12-25 DIAGNOSIS — K2980 Duodenitis without bleeding: Secondary | ICD-10-CM

## 2024-12-25 DIAGNOSIS — R1013 Epigastric pain: Secondary | ICD-10-CM

## 2024-12-25 LAB — BASIC METABOLIC PANEL WITH GFR
BUN: 9 mg/dL (ref 6–23)
CO2: 23 meq/L (ref 19–32)
Calcium: 9.6 mg/dL (ref 8.4–10.5)
Chloride: 92 meq/L — ABNORMAL LOW (ref 96–112)
Creatinine, Ser: 1.04 mg/dL (ref 0.40–1.20)
GFR: 59.61 mL/min — ABNORMAL LOW
Glucose, Bld: 360 mg/dL — ABNORMAL HIGH (ref 70–99)
Potassium: 3.2 meq/L — ABNORMAL LOW (ref 3.5–5.1)
Sodium: 130 meq/L — ABNORMAL LOW (ref 135–145)

## 2024-12-25 LAB — CBC WITH DIFFERENTIAL/PLATELET
Basophils Absolute: 0.1 K/uL (ref 0.0–0.1)
Basophils Relative: 0.8 % (ref 0.0–3.0)
Eosinophils Absolute: 0 K/uL (ref 0.0–0.7)
Eosinophils Relative: 0.6 % (ref 0.0–5.0)
HCT: 37 % (ref 36.0–46.0)
Hemoglobin: 12.6 g/dL (ref 12.0–15.0)
Lymphocytes Relative: 15.1 % (ref 12.0–46.0)
Lymphs Abs: 1.2 K/uL (ref 0.7–4.0)
MCHC: 34 g/dL (ref 30.0–36.0)
MCV: 89.6 fl (ref 78.0–100.0)
Monocytes Absolute: 0.5 K/uL (ref 0.1–1.0)
Monocytes Relative: 6.8 % (ref 3.0–12.0)
Neutro Abs: 6 K/uL (ref 1.4–7.7)
Neutrophils Relative %: 76.7 % (ref 43.0–77.0)
Platelets: 232 K/uL (ref 150.0–400.0)
RBC: 4.13 Mil/uL (ref 3.87–5.11)
RDW: 14 % (ref 11.5–15.5)
WBC: 7.8 K/uL (ref 4.0–10.5)

## 2024-12-25 LAB — MAGNESIUM: Magnesium: 1.3 mg/dL — ABNORMAL LOW (ref 1.5–2.5)

## 2024-12-25 LAB — HEPATIC FUNCTION PANEL
ALT: 13 U/L (ref 3–35)
AST: 19 U/L (ref 5–37)
Albumin: 3.9 g/dL (ref 3.5–5.2)
Alkaline Phosphatase: 121 U/L — ABNORMAL HIGH (ref 39–117)
Bilirubin, Direct: 0.1 mg/dL (ref 0.1–0.3)
Total Bilirubin: 0.4 mg/dL (ref 0.2–1.2)
Total Protein: 7.3 g/dL (ref 6.0–8.3)

## 2024-12-25 LAB — FOLATE: Folate: 11.1 ng/mL

## 2024-12-25 LAB — VITAMIN B12: Vitamin B-12: 341 pg/mL (ref 211–911)

## 2024-12-25 LAB — LIPASE: Lipase: 88 U/L — ABNORMAL HIGH (ref 11.0–59.0)

## 2024-12-25 NOTE — Patient Instructions (Addendum)
 Your provider has requested that you go to the basement level for lab work before leaving today. Press B on the elevator. The lab is located at the first door on the left as you exit the elevator.  Due to recent changes in healthcare laws, you may see the results of your imaging and laboratory studies on MyChart before your provider has had a chance to review them.  We understand that in some cases there may be results that are confusing or concerning to you. Not all laboratory results come back in the same time frame and the provider may be waiting for multiple results in order to interpret others.  Please give us  48 hours in order for your provider to thoroughly review all the results before contacting the office for clarification of your results.   Follow-up pending labs results.   _______________________________________________________  If your blood pressure at your visit was 140/90 or greater, please contact your primary care physician to follow up on this.  _______________________________________________________  If you are age 58 or older, your body mass index should be between 23-30. Your Body mass index is 24.89 kg/m. If this is out of the aforementioned range listed, please consider follow up with your Primary Care Provider.  If you are age 58 or younger, your body mass index should be between 19-25. Your Body mass index is 24.89 kg/m. If this is out of the aformentioned range listed, please consider follow up with your Primary Care Provider.   ________________________________________________________  The [facility] providers would like to encourage you to use MYCHART to communicate with providers for non-urgent requests or questions.  Due to long hold times on the telephone, sending your provider a message by [facility] may be a faster and more efficient way to get a response.  Please allow 48 business hours for a response.  Please remember that this is for non-urgent requests.   _______________________________________________________  [facility] is using a team-based approach to care.  Your team is made up of your doctor and two to three APPS. Our APPS (Nurse Practitioners and Physician Assistants) work with your physician to ensure care continuity for you. They are fully qualified to address your health concerns and develop a treatment plan. They communicate directly with your gastroenterologist to care for you. Seeing the Advanced Practice Practitioners on your physician's team can help you by facilitating care more promptly, often allowing for earlier appointments, access to diagnostic testing, procedures, and other specialty referrals.   Thank you for choosing me and [facility]. Ellouise Console PA-C    Acute Pancreatitis  Acute pancreatitis happens when a gland called the pancreas suddenly develops inflammation, making it irritated and swollen. The pancreas is found on the left side of the abdomen, behind the stomach. The pancreas makes proteins (enzymes) that help to digest food. It also releases the hormones glucagon and insulin. These help to regulate blood sugar. Most sudden (acute) attacks of this condition last a few days and can cause serious problems. Some people become dehydrated and develop low blood pressure. In severe cases, bleeding in the abdomen can lead to shock and can be life-threatening. The lungs, heart, and kidneys may stop working. What are the causes? This condition may be caused by: Heavy alcohol use. Drug use. Gallstones or other conditions that can block the tube that drains the pancreas (pancreatic duct). A tumor in the pancreas. Other causes include: Being exposed to certain medicines or certain chemicals. Having health conditions such as diabetes, high triglycerides,  or high calcium  levels in your blood. High calcium  levels are usually caused by the parathyroid gland being too active. An infection in the  pancreas. Damage caused by an accident (trauma) or by the poison (venom) of a scorpion sting. Abdominal surgery. Autoimmune pancreatitis. This is when the body's disease-fighting system (immune system) attacks the pancreas. Genes that are passed from parent to child (inherited). In some cases, the cause of this condition is not known. What are the signs or symptoms? Symptoms of this condition include: Pain in the upper abdomen that may spread (radiate) to the back. Pain may be severe and often worsens after you eat. A tender and swollen abdomen. Nausea and vomiting. Fever. How is this diagnosed? This condition may be diagnosed based on: A physical exam. Blood tests. These include an increased (elevated) level of lipase or amylase. Imaging tests, such as CT scans, MRIs, or an ultrasound of the abdomen. How is this treated? Treatment for this condition often requires a hospital stay and may include: Pain medicine. IV fluids. Placing a tube in the stomach to remove stomach contents and to control vomiting (nasogastric tube, or NG tube). Not eating until vomiting has lessened. Treating any underlying conditions that may be the cause. Treatment may include: Antibiotic medicines, if your condition is caused by an infection. Steroid medicine, if your condition is caused by your immune system attacking your pancreas (autoimmune disease). Surgery on the gallbladder or pancreas, if your condition is caused by gallstones or another blockage. Follow these instructions at home: Medicines Take over-the-counter and prescription medicines only as told by your health care provider. If you were prescribed an antibiotic medicine, take it as told by your health care provider. Do not stop using the antibiotic even if you start to feel better. Ask your health care provider if the medicine prescribed to you: Requires you to avoid driving or using machinery. Can cause constipation. You may need to take  these actions to prevent or treat constipation: Take over-the-counter or prescription medicines. Eat foods that are high in fiber, such as beans, whole grains, and fresh fruits and vegetables. Limit foods that are high in fat and processed sugars, such as fried or sweet foods. Eating and drinking  Follow instructions from your health care provider about diet. This may involve avoiding alcohol and having less fat in your diet. Eat smaller, more frequent meals. Doing this causes the pancreas to make less digestive fluid. Drink enough fluid to keep your urine pale yellow. Do not drink alcohol if it caused your condition. General instructions Do not use any products that contain nicotine or tobacco. These products include cigarettes, chewing tobacco, and vaping devices, such as e-cigarettes. If you need help quitting, ask your health care provider. Get plenty of rest. If directed, check your blood sugar at home as told by your health care provider. Keep all follow-up visits. This is important. Contact a health care provider if: You do not get better as fast as expected. Your symptoms get worse or you get new symptoms. You keep having pain, weakness, or nausea. You get better and then pain comes back. You have a fever. Get help right away if: You vomit every time you eat or drink. Your pain becomes severe. Your skin or the white parts of your eyes turn yellow (jaundice). You have sudden swelling in your abdomen. You feel dizzy or you faint. Your blood sugar is high (over 300 mg/dL). You vomit blood. These symptoms may be an emergency. Get help  right away. Call 911. Do not wait to see if the symptoms will go away. Do not drive yourself to the hospital. Summary Acute pancreatitis happens when inflammation of the pancreas suddenly occurs and the pancreas becomes irritated and swollen. This condition is typically caused by heavy alcohol use, drug use, or gallstones. Treatment for this  condition usually requires a stay in the hospital. This information is not intended to replace advice given to you by your health care provider. Make sure you discuss any questions you have with your health care provider. Document Revised: 10/21/2021 Document Reviewed: 10/21/2021 Elsevier Patient Education  2024 [employer]Pancreatitis Eating Plan Pancreatitis is when your pancreas gets irritated and swollen (inflamed). The pancreas is a small gland behind your stomach. It helps your body digest food and regulate your blood sugar. Pancreatitis can affect how your body digests food, especially foods with fat. You may also have other symptoms such as pain in your abdomen (abdominal pain) or nausea. When you have pancreatitis, following a low-fat eating plan may help you manage symptoms and recover faster. Work with your health care provider or a dietitian to create an eating plan that is right for you. What are tips for following this plan? Reading food labels Use the information on food labels to help keep track of how much fat you eat: Check the serving size. Look for the amount of total fat in grams (g) in one serving. Low-fat foods have 3 g of fat or less per serving. Fat-free foods have 0.5 g of fat or less per serving. Keep track of how much fat you eat based on how many servings you eat. For example, if you eat two servings, the amount of fat you eat will be twice what is listed on the label. Shopping  Buy low-fat or nonfat foods, such as: Fresh, frozen, or canned fruits and vegetables. Grains, including pasta, bread, and rice. Lean meat, poultry, fish, and other protein foods. Low-fat or nonfat dairy. Avoid buying bakery products and other sweets made with whole milk, butter, and eggs. Avoid buying snack foods with added fat, such as anything with butter or cheese flavoring. Cooking Remove skin from poultry, and remove extra fat from meat. Limit the amount of fat and oil you use to  6 tsp (30 mL) or less per day. Cook using low-fat methods, such as boiling, broiling, grilling, steaming, or baking. Use spray oil to cook. Add fat-free chicken broth to add flavor and moisture. Avoid adding cream to thicken soups or sauces. Use other thickeners such as corn starch or tomato paste. Meal planning  Eat a low-fat diet as told by your dietitian. For most people, this means having no more than 55-65 g of fat each day. Eat small, frequent meals throughout the day. For example, you may have 5 or 6 small meals instead of 3 large meals. Drink enough fluid to keep your urine pale yellow. Do not drink alcohol. Talk to your health care provider if you need help stopping. Limit how much caffeine you have, including black coffee, black and green tea, soft drinks with caffeine, and energy drinks. Plan to include a variety of foods in your diet. Include fruits, vegetables, whole grains and lean proteins, and low-fat or nonfat dairy. You need a balanced diet for good overall health. General information Let your health care provider or dietitian know if you have unplanned weight loss on this eating plan. You may be told to follow a clear liquid or soft food diet  when symptoms come back, which is called a flare. Talk with your health care provider about how to manage your diet during symptoms of a flare. Take any vitamins or supplements as told by your health care provider. You may need to take: Extra vitamins that dissolve in fat (are fat soluble), such as vitamins A, D, E, and K. Nutritional supplements. Work with a data processing manager, especially if you have other conditions such as obesity, osteoporosis, or diabetes mellitus. Some people need extra treatments, such as: Pills or capsules to replace enzymes (oral pancreatic enzyme replacement therapy). Feedings through a tube in the stomach or intestine (enteral feedings). What foods should I avoid? Fruits Fried fruits. Fruits served with butter or  cream. Vegetables Fried vegetables. Vegetables cooked with butter, cheese, or cream. Grains Biscuits, waffles, donuts, pastries, and croissants. Pies and cookies. Butter-flavored popcorn. Regular crackers. Meats and other proteins Fatty cuts of meat. Poultry with skin. Organ meats. Precooked or cured meat, such as sausages or meat loaves. Whole eggs. Nuts and nut butters. Dairy Whole and 2% milk. Whole milk yogurt. Whole milk ice cream. Cream and half-and-half. Cheese, such as cream cheese. Sour cream. Beverages Wine, beer, and liquor. The items listed above may not be a complete list of foods and beverages you should avoid. Contact a dietitian for more information. Summary Pancreatitis can affect how your body digests food, especially foods with fat. When you have pancreatitis, it is recommended that you follow a low-fat eating plan to help you recover faster and manage symptoms. Do not drink alcohol. Limit the amount of caffeine you have, and drink enough fluid to keep your urine pale yellow. This information is not intended to replace advice given to you by your health care provider. Make sure you discuss any questions you have with your health care provider. Document Revised: 11/19/2021 Document Reviewed: 11/19/2021 Elsevier Patient Education  2024 [employer].

## 2024-12-25 NOTE — Progress Notes (Signed)
 Agree with assessment and plan as outlined.

## 2024-12-26 LAB — IRON,TIBC AND FERRITIN PANEL
%SAT: 13 % — ABNORMAL LOW (ref 16–45)
Ferritin: 196 ng/mL (ref 16–232)
Iron: 43 ug/dL — ABNORMAL LOW (ref 45–160)
TIBC: 324 ug/dL (ref 250–450)

## 2024-12-26 LAB — CELIAC DISEASE AB SCREEN W/RFX
Deamidated Gliadin Abs, IgA: 25 U — ABNORMAL HIGH (ref 0–19)
Immunoglobulin A, (IgA) QN, Serum: 387 mg/dL — ABNORMAL HIGH (ref 87–352)
t-Transglutaminase (tTG) IgA: <2 U/mL (ref 0–3)

## 2024-12-28 ENCOUNTER — Ambulatory Visit: Payer: Self-pay | Admitting: Physician Assistant

## 2024-12-28 DIAGNOSIS — R748 Abnormal levels of other serum enzymes: Secondary | ICD-10-CM

## 2024-12-28 DIAGNOSIS — K852 Alcohol induced acute pancreatitis without necrosis or infection: Secondary | ICD-10-CM

## 2024-12-28 DIAGNOSIS — N189 Chronic kidney disease, unspecified: Secondary | ICD-10-CM

## 2024-12-28 MED ORDER — POTASSIUM CHLORIDE ER 20 MEQ PO TBCR: 20.0000 meq | EXTENDED_RELEASE_TABLET | Freq: Every day | ORAL | 0 refills | Status: AC

## 2024-12-28 MED ORDER — PANTOPRAZOLE SODIUM 40 MG PO TBEC: 40.0000 mg | DELAYED_RELEASE_TABLET | Freq: Every day | ORAL | 0 refills | Status: AC

## 2024-12-28 NOTE — Progress Notes (Signed)
 Please call and notify patient labs show: 1.  Iron is low.  Folate and B12 are normal.  Hemoglobin has improved to normal. 2.  No evidence of Celiac disease or gluten allergy. 3.  Low potassium, sodium, and chloride. 4.  Moderately elevated glucose (360) 5.  Mild chronic kidney disease. 6.  Mild low magnesium . 7.  Lipase pancreas test has greatly improved down to 88 (normal less than 60). 8.  Liver test have greatly improved.  Alkaline phosphatase still mildly elevated yet improved.  All other LFTs normal.  I recommend: It is imperative that she continue to avoid all alcohol. Follow-up with PCP for diabetes. Start OTC iron ferrous sulfate 325 mg 1 tablet once daily for 1 month. Start Rx potassium chloride  20 mEq daily for 10 days, #10, no refills. Start magnesium  supplement: OTC Nature Made Magnesium  glycinate 200 mg, take 2 tablets daily for 1 month. Repeat labs in 1 month: CMP, CBC, iron panel, lipase, magnesium . Please send lab results to patient's PCP: Geni Free, NP  Ellouise Console, PA-C

## 2024-12-29 ENCOUNTER — Telehealth: Payer: Self-pay | Admitting: Gastroenterology

## 2024-12-29 NOTE — Telephone Encounter (Signed)
 Unfortunately, I am not at the office this week so gave not seen it. Patient said she brought it Monday?

## 2024-12-29 NOTE — Telephone Encounter (Signed)
 Thanks Ellouise, really appreciate your help with this

## 2024-12-29 NOTE — Telephone Encounter (Signed)
 Ellouise saw the patient this week and I thought was helping with this. Thanks

## 2024-12-29 NOTE — Telephone Encounter (Signed)
 Informed patient that the FMLA paperwork and short term disability forms are on Tina Garrett's desk to be filled out. Informed patient that Ellouise is still working on the forms and expects them to be ready next week since they were dropped off on Tuesday 12/25/24. Patient verbalized understanding.

## 2024-12-29 NOTE — Telephone Encounter (Signed)
 Inbound call from patient requesting to know the status of her FMLA paperwork. Patient was upset that we could know tell her the status on her FMLA paperwork. Patient stated  we needed to get it together and was told her paperwork would be done by Monday. Patient is requesting a call back .Please advise.

## 2024-12-29 NOTE — Telephone Encounter (Signed)
 I  have looked on Dr. Hassan desk and did not see paperwork. Dottie, Dr. Leigh and Ellouise, have you seen this paperwork?

## 2025-01-02 ENCOUNTER — Telehealth: Payer: Self-pay | Admitting: Gastroenterology

## 2025-01-02 NOTE — Telephone Encounter (Signed)
 Called pt for completed FMLA paperwork/left voice mail. 01/02/25.

## 2025-01-03 NOTE — Telephone Encounter (Signed)
 Hannah Hale completed the forms and they were given to [facility] at front desk on Tuesday, 1-20.  He reached out to the patient to let her know but he had to leave a message.  Please see other TE re: same

## 2025-01-03 NOTE — Telephone Encounter (Signed)
 Sage, front office, reached the patient and let her know the forms are ready for pick up
